# Patient Record
Sex: Female | Born: 1980 | Race: Black or African American | Hispanic: No | Marital: Single | Smoking: Former smoker
Health system: Southern US, Community
[De-identification: ages and names within clinical notes are randomized; demographics above are authoritative.]

## PROBLEM LIST (undated history)

## (undated) ENCOUNTER — Emergency Department (HOSPITAL_BASED_OUTPATIENT_CLINIC_OR_DEPARTMENT_OTHER): Admission: EM | Source: Home / Self Care

## (undated) DIAGNOSIS — A749 Chlamydial infection, unspecified: Secondary | ICD-10-CM

## (undated) DIAGNOSIS — I10 Essential (primary) hypertension: Secondary | ICD-10-CM

## (undated) DIAGNOSIS — N938 Other specified abnormal uterine and vaginal bleeding: Secondary | ICD-10-CM

## (undated) DIAGNOSIS — N12 Tubulo-interstitial nephritis, not specified as acute or chronic: Secondary | ICD-10-CM

## (undated) DIAGNOSIS — N7011 Chronic salpingitis: Secondary | ICD-10-CM

## (undated) DIAGNOSIS — A6000 Herpesviral infection of urogenital system, unspecified: Secondary | ICD-10-CM

## (undated) DIAGNOSIS — A599 Trichomoniasis, unspecified: Secondary | ICD-10-CM

## (undated) DIAGNOSIS — N739 Female pelvic inflammatory disease, unspecified: Secondary | ICD-10-CM

## (undated) DIAGNOSIS — N390 Urinary tract infection, site not specified: Secondary | ICD-10-CM

## (undated) DIAGNOSIS — J40 Bronchitis, not specified as acute or chronic: Secondary | ICD-10-CM

## (undated) HISTORY — DX: Other specified abnormal uterine and vaginal bleeding: N93.8

## (undated) HISTORY — PX: ANKLE SURGERY: SHX546

## (undated) HISTORY — PX: WISDOM TOOTH EXTRACTION: SHX21

---

## 2002-10-01 ENCOUNTER — Inpatient Hospital Stay (HOSPITAL_COMMUNITY): Admission: AD | Admit: 2002-10-01 | Discharge: 2002-10-01 | Payer: Self-pay | Admitting: *Deleted

## 2002-10-05 ENCOUNTER — Inpatient Hospital Stay (HOSPITAL_COMMUNITY): Admission: AD | Admit: 2002-10-05 | Discharge: 2002-10-05 | Payer: Self-pay | Admitting: *Deleted

## 2002-11-03 ENCOUNTER — Inpatient Hospital Stay (HOSPITAL_COMMUNITY): Admission: AD | Admit: 2002-11-03 | Discharge: 2002-11-03 | Payer: Self-pay | Admitting: Family Medicine

## 2003-02-28 ENCOUNTER — Inpatient Hospital Stay (HOSPITAL_COMMUNITY): Admission: AD | Admit: 2003-02-28 | Discharge: 2003-02-28 | Payer: Self-pay | Admitting: Obstetrics and Gynecology

## 2003-12-15 ENCOUNTER — Inpatient Hospital Stay (HOSPITAL_COMMUNITY): Admission: AD | Admit: 2003-12-15 | Discharge: 2003-12-16 | Payer: Self-pay | Admitting: *Deleted

## 2005-11-21 ENCOUNTER — Emergency Department (HOSPITAL_COMMUNITY): Admission: EM | Admit: 2005-11-21 | Discharge: 2005-11-21 | Payer: Self-pay | Admitting: Emergency Medicine

## 2005-11-27 ENCOUNTER — Ambulatory Visit (HOSPITAL_COMMUNITY): Admission: RE | Admit: 2005-11-27 | Discharge: 2005-11-27 | Payer: Self-pay | Admitting: Orthopedic Surgery

## 2007-03-24 ENCOUNTER — Emergency Department (HOSPITAL_COMMUNITY): Admission: EM | Admit: 2007-03-24 | Discharge: 2007-03-24 | Payer: Self-pay | Admitting: Emergency Medicine

## 2009-01-16 ENCOUNTER — Inpatient Hospital Stay (HOSPITAL_COMMUNITY): Admission: AD | Admit: 2009-01-16 | Discharge: 2009-01-16 | Payer: Self-pay | Admitting: Obstetrics & Gynecology

## 2009-02-27 ENCOUNTER — Emergency Department (HOSPITAL_COMMUNITY): Admission: EM | Admit: 2009-02-27 | Discharge: 2009-02-27 | Payer: Self-pay | Admitting: Emergency Medicine

## 2009-03-13 ENCOUNTER — Inpatient Hospital Stay (HOSPITAL_COMMUNITY): Admission: AD | Admit: 2009-03-13 | Discharge: 2009-03-14 | Payer: Self-pay | Admitting: Obstetrics & Gynecology

## 2009-06-03 ENCOUNTER — Emergency Department (HOSPITAL_COMMUNITY): Admission: EM | Admit: 2009-06-03 | Discharge: 2009-06-03 | Payer: Self-pay | Admitting: Emergency Medicine

## 2009-08-28 ENCOUNTER — Emergency Department (HOSPITAL_COMMUNITY): Admission: EM | Admit: 2009-08-28 | Discharge: 2009-08-28 | Payer: Self-pay | Admitting: Emergency Medicine

## 2010-02-22 ENCOUNTER — Emergency Department (HOSPITAL_COMMUNITY): Admission: EM | Admit: 2010-02-22 | Discharge: 2010-02-22 | Payer: Self-pay | Admitting: Emergency Medicine

## 2010-07-16 ENCOUNTER — Ambulatory Visit: Payer: Self-pay | Admitting: Nurse Practitioner

## 2010-07-16 ENCOUNTER — Inpatient Hospital Stay (HOSPITAL_COMMUNITY): Admission: AD | Admit: 2010-07-16 | Discharge: 2010-07-16 | Payer: Self-pay | Admitting: Obstetrics & Gynecology

## 2011-01-29 LAB — URINE MICROSCOPIC-ADD ON

## 2011-01-29 LAB — URINALYSIS, ROUTINE W REFLEX MICROSCOPIC
Bilirubin Urine: NEGATIVE
Nitrite: POSITIVE — AB
Protein, ur: NEGATIVE mg/dL
Urobilinogen, UA: 0.2 mg/dL (ref 0.0–1.0)

## 2011-01-29 LAB — URINE CULTURE: Colony Count: >=100000

## 2011-01-29 LAB — WET PREP, GENITAL: Trich, Wet Prep: NONE SEEN

## 2011-02-04 LAB — PREGNANCY, URINE: Preg Test, Ur: NEGATIVE

## 2011-02-04 LAB — URINALYSIS, ROUTINE W REFLEX MICROSCOPIC
Bilirubin Urine: NEGATIVE
Ketones, ur: NEGATIVE mg/dL
Ketones, ur: NEGATIVE mg/dL
Nitrite: NEGATIVE
Nitrite: NEGATIVE
Urobilinogen, UA: 0.2 mg/dL (ref 0.0–1.0)
pH: 6 (ref 5.0–8.0)

## 2011-02-04 LAB — WET PREP, GENITAL
Trich, Wet Prep: NONE SEEN
Yeast Wet Prep HPF POC: NONE SEEN

## 2011-02-04 LAB — URINE MICROSCOPIC-ADD ON

## 2011-02-04 LAB — GC/CHLAMYDIA PROBE AMP, GENITAL: Chlamydia, DNA Probe: NEGATIVE

## 2011-02-22 LAB — RAPID STREP SCREEN (MED CTR MEBANE ONLY): Streptococcus, Group A Screen (Direct): NEGATIVE

## 2011-02-25 LAB — COMPREHENSIVE METABOLIC PANEL
ALT: 35 U/L (ref 0–35)
AST: 29 U/L (ref 0–37)
Albumin: 3.6 g/dL (ref 3.5–5.2)
Alkaline Phosphatase: 109 U/L (ref 39–117)
Chloride: 108 mEq/L (ref 96–112)
GFR calc Af Amer: >60 mL/min (ref >60)
Potassium: 4.9 mEq/L (ref 3.5–5.1)
Sodium: 142 mEq/L (ref 135–145)
Total Protein: 7 g/dL (ref 6.0–8.3)

## 2011-02-25 LAB — URINALYSIS, ROUTINE W REFLEX MICROSCOPIC
Bilirubin Urine: NEGATIVE
Ketones, ur: NEGATIVE mg/dL
Nitrite: NEGATIVE
Nitrite: POSITIVE — AB
Protein, ur: NEGATIVE mg/dL
Protein, ur: >300 mg/dL — AB
Specific Gravity, Urine: 1.016 (ref 1.005–1.030)
Urobilinogen, UA: 1 mg/dL (ref 0.0–1.0)
pH: 6 (ref 5.0–8.0)

## 2011-02-25 LAB — DIFFERENTIAL
Basophils Relative: 1 % (ref 0–1)
Eosinophils Absolute: 0.2 10*3/uL (ref 0.0–0.7)
Eosinophils Relative: 2 % (ref 0–5)
Monocytes Absolute: 0.3 10*3/uL (ref 0.1–1.0)
Monocytes Relative: 3 % (ref 3–12)
Neutro Abs: 7 10*3/uL (ref 1.7–7.7)

## 2011-02-25 LAB — WET PREP, GENITAL
WBC, Wet Prep HPF POC: NONE SEEN
Yeast Wet Prep HPF POC: NONE SEEN

## 2011-02-25 LAB — CBC
Platelets: 367 10*3/uL (ref 150–400)
RDW: 13.7 % (ref 11.5–15.5)
WBC: 9.9 10*3/uL (ref 4.0–10.5)

## 2011-02-25 LAB — GC/CHLAMYDIA PROBE AMP, GENITAL: Chlamydia, DNA Probe: NEGATIVE

## 2011-02-25 LAB — URINE MICROSCOPIC-ADD ON

## 2011-02-25 LAB — PREGNANCY, URINE: Preg Test, Ur: NEGATIVE

## 2011-03-20 ENCOUNTER — Inpatient Hospital Stay (HOSPITAL_COMMUNITY)
Admission: AD | Admit: 2011-03-20 | Discharge: 2011-03-20 | Disposition: A | Discharge status: Other Acute Inpatient Hospital | Payer: Medicaid Other | Source: Ambulatory Visit | Attending: Obstetrics and Gynecology | Admitting: Obstetrics and Gynecology

## 2011-03-20 ENCOUNTER — Emergency Department (HOSPITAL_COMMUNITY): Payer: Medicaid Other

## 2011-03-20 ENCOUNTER — Emergency Department (HOSPITAL_COMMUNITY)
Admission: EM | Admit: 2011-03-20 | Discharge: 2011-03-21 | Disposition: A | Discharge status: Home / Self Care | Payer: Medicaid Other | Attending: Emergency Medicine | Admitting: Emergency Medicine

## 2011-03-20 ENCOUNTER — Emergency Department (HOSPITAL_COMMUNITY): Payer: Self-pay

## 2011-03-20 DIAGNOSIS — R1031 Right lower quadrant pain: Secondary | ICD-10-CM | POA: Insufficient documentation

## 2011-03-20 DIAGNOSIS — A5901 Trichomonal vulvovaginitis: Secondary | ICD-10-CM

## 2011-03-20 DIAGNOSIS — N739 Female pelvic inflammatory disease, unspecified: Secondary | ICD-10-CM | POA: Insufficient documentation

## 2011-03-20 DIAGNOSIS — A599 Trichomoniasis, unspecified: Secondary | ICD-10-CM | POA: Insufficient documentation

## 2011-03-20 LAB — DIFFERENTIAL
Basophils Absolute: 0 10*3/uL (ref 0.0–0.1)
Basophils Relative: 0 % (ref 0–1)
Eosinophils Relative: 2 % (ref 0–5)
Monocytes Absolute: 1.2 10*3/uL — ABNORMAL HIGH (ref 0.1–1.0)
Neutro Abs: 7.5 10*3/uL (ref 1.7–7.7)

## 2011-03-20 LAB — URINALYSIS, ROUTINE W REFLEX MICROSCOPIC
Glucose, UA: NEGATIVE mg/dL
Leukocytes, UA: NEGATIVE
Protein, ur: NEGATIVE mg/dL
Specific Gravity, Urine: >1.03 — ABNORMAL HIGH (ref 1.005–1.030)

## 2011-03-20 LAB — POCT I-STAT, CHEM 8
Creatinine, Ser: 0.8 mg/dL (ref 0.4–1.2)
Glucose, Bld: 83 mg/dL (ref 70–99)
Hemoglobin: 14.6 g/dL (ref 12.0–15.0)
TCO2: 25 mmol/L (ref 0–100)

## 2011-03-20 LAB — WET PREP, GENITAL: Yeast Wet Prep HPF POC: NONE SEEN

## 2011-03-20 LAB — URINE MICROSCOPIC-ADD ON

## 2011-03-20 LAB — CBC
Hemoglobin: 13.5 g/dL (ref 12.0–15.0)
MCHC: 32 g/dL (ref 30.0–36.0)
RDW: 13.5 % (ref 11.5–15.5)

## 2011-03-20 LAB — HEPATIC FUNCTION PANEL
Bilirubin, Direct: 0.1 mg/dL (ref 0.0–0.3)
Total Bilirubin: 0.4 mg/dL (ref 0.3–1.2)

## 2011-03-20 MED ORDER — IOHEXOL 300 MG/ML  SOLN
100.0000 mL | Freq: Once | INTRAMUSCULAR | Status: AC | PRN
  Administered 2011-03-20: 100 mL via INTRAVENOUS

## 2011-03-21 ENCOUNTER — Emergency Department (HOSPITAL_COMMUNITY): Payer: Medicaid Other

## 2011-03-21 LAB — GC/CHLAMYDIA PROBE AMP, GENITAL
Chlamydia, DNA Probe: NEGATIVE
GC Probe Amp, Genital: NEGATIVE

## 2011-03-23 ENCOUNTER — Other Ambulatory Visit (HOSPITAL_COMMUNITY): Payer: Self-pay

## 2011-04-03 NOTE — Op Note (Signed)
NAME:  Rebecca Flynn, Rebecca Flynn                  ACCOUNT NO.:  [id number]   MEDICAL RECORD NO.:  [id number]          PATIENT TYPE:  AMB   LOCATION:  SDS                          FACILITY:  [facility]   PHYSICIAN:  Madlyn Frankel. Charlann Boxer, M.D.  DATE OF BIRTH:  01/12/1981   DATE OF PROCEDURE:  11/27/2005  DATE OF DISCHARGE:  11/27/2005                                 OPERATIVE REPORT   PREOPERATIVE DIAGNOSIS:  Right ankle bimalleolar fracture, closed.   POSTOPERATIVE DIAGNOSIS:  Right ankle bimalleolar fracture, closed.   OPERATION PERFORMED:  Open reduction and internal fixation of right ankle  fracture utilizing the Synthes Small Frag System with antiglide technique  laterally, and two 4.5 millimeter cancellous screws medially.   SURGEON:  Madlyn Frankel. Charlann Boxer, M.D.   ASSISTANTDruscilla Brownie. Idolina Primer, P. A.-C   ANESTHESIA:  General.   ESTIMATED BLOOD LOSS:  Blood loss was minimal.   TOURNIQUET TIME:  The tourniquet time was 45 minutes at three-hundred  millimeters of mercury   COMPLICATIONS:  There were no complications.   INDICATIONS FOR SURGERY:  Rebecca Flynn is a 30 year old female who was initially  seen and evaluated in the emergency room in the early morning hours on  Saturday, November 21, 2004.  She was noted to have fallen in a mud hole and  had a twisting type injury.  Radiographically she had a supination external  rotation type 4 pattern fracture.  Based on some initial swelling she was  placed in a splint and had her leg elevated, and was to return to the  office.  Surgery was scheduled for a week after with strict elevation.   The risks and benefits of a nonhealing wound based on her smoking, increased  infection rate based on her size and smoking, and persistent discomfort  about the ankle following injury were all reviewed and consent was obtained.   DESCRIPTION OF THE OPERATION:  The patient was brought to the operative.  Once she had adequate anesthesia and preoperative antibiotics of 1  gram of  Ancef were administered the patient was positioned supine with a bumper  underneath the hip.  The right lower extremity was then prepped and draped  in sterile fashion  the right lower extremity was then exsanguinated and a  distal thigh tourniquet elevated.   A lateral based incision was made first over the fibula.  The fracture site  was identified.  Initial attempts at a lag screw technique were carried out;  however, I was not happy with the purchase on the posterior aspect of the  fibula in terms of its reduced.  Based on this antiglide technique was  chosen.   Following exposure a seven-hole plate was placed along the posterior aspect  of the fibula and using a lobster claw clamp the fracture was reduced  anatomically.  From this region thee bicortical screws were placed proximal  to the fracture site maintaining the fracture reduction.  Radiographs were  obtained.   At this time  attention was directed to the medial aspect.  A slight J  curved incision was utilized  to expose the medial malleolar fracture.  The  fracture was reduced anatomically using a bony tenaculum.  Once this was  done the 2.5 drill bit was passed through the malleolus and into the  tibialmost __________ .  Radiographs confirmed reduction.  At this point two  45 mm, 4.5 mm cancellous bone screws were then passed through the drill  holes.  Final radiographs were obtained.  The wounds were irrigated  copiously.  The medial and lateral wounds were closed in layers with nylon  sutures on the skin.  The wounds were then cleaned, dried and dressed  sterilely with Xeroform, sponges and a bulky posterior splint with a U and  L.   The patient as extubated and taken to the recovery room in stable condition.   DISCHARGE INSTRUCTIONS:  The patient will be discharged to home with pain  medication and antibiotic prophylaxis.  I will see her back was watching the  fracture heal.      Madlyn Frankel. Charlann Boxer, M.D.   Electronically Signed     MDO/MEDQ  D:  11/27/2005  T:  11/28/2005  Job:  [id number]

## 2011-04-03 NOTE — Discharge Summary (Signed)
NAME:  Rebecca Flynn, Rebecca Flynn                            ACCOUNT NO.:  [id number]   MEDICAL RECORD NO.:  [id number]                   PATIENT TYPE:  INP   LOCATION:  9306                                 FACILITY:  [facility]   PHYSICIAN:  Ivor Costa. Farrel Gobble, M.D.              DATE OF BIRTH:  [birth year]   DATE OF ADMISSION:  12/15/2003  DATE OF DISCHARGE:  12/16/2003                                 DISCHARGE SUMMARY   DISCHARGE DIAGNOSIS:  Pelvic pain with nausea, vomiting, and complaint of  discharge.   HISTORY OF PRESENT ILLNESS:  She is a 30 year old gravida 2 para 2 who  presented to the maternity admissions with a complaint of nausea, vomiting,  abdominal pain for 2 days, unable to tolerate fluids, complaint of a foul  discharge for 2 days, and low abdominal pain.  She does have a history of  gonorrhea and chlamydia in 2004 and a history of HSV.   LABORATORY DATA:  She was noted to have a urinary tract infection and she  also had cultures taken for gonorrhea and chlamydia that were both positive  and is currently on no contraception.   HOSPITAL COURSE AND TREATMENT:  She was treated for the [facility] and chlamydia  with IV Rocephin and IV doxycycline and was given Toradol for pain.  She was  also given Cipro IV as well.  She had an ultrasound that did show an  extensive pelvic inflammatory disease involving both adnexa and pelvic cul-  de-sac.  There also was a tuboovarian abscess formation on the right side.  She was discharged home with samples of medication of the Cipro.  She was  instructed to take 500 mg p.o. b.i.d. for 6 days.  She was also given a  prescription for doxycycline 100 mg to take b.i.d. for 6 days.  She was  encouraged to follow up with the health department and to follow up with an  ultrasound as well to check for resolution of the abscess, encouraged  contraception and condom use always, and she was discharged home in  satisfactory condition.     Davonna Belling. Young, N.P.                       Ivor Costa. Farrel Gobble, M.D.    [place] Lanius  D:  01/16/2004  T:  01/16/2004  Job:  [id number]

## 2011-05-05 ENCOUNTER — Inpatient Hospital Stay (HOSPITAL_COMMUNITY)
Admission: AD | Admit: 2011-05-05 | Discharge: 2011-05-07 | DRG: 759 | Disposition: A | Discharge status: Home / Self Care | Payer: Medicaid Other | Source: Ambulatory Visit | Attending: Obstetrics & Gynecology | Admitting: Obstetrics & Gynecology

## 2011-05-05 ENCOUNTER — Emergency Department (HOSPITAL_COMMUNITY): Payer: Medicaid Other

## 2011-05-05 ENCOUNTER — Emergency Department (HOSPITAL_COMMUNITY)
Admission: EM | Admit: 2011-05-05 | Discharge: 2011-05-05 | Disposition: A | Discharge status: Other Acute Inpatient Hospital | Payer: Medicaid Other | Attending: Emergency Medicine | Admitting: Emergency Medicine

## 2011-05-05 DIAGNOSIS — N739 Female pelvic inflammatory disease, unspecified: Secondary | ICD-10-CM | POA: Insufficient documentation

## 2011-05-05 DIAGNOSIS — N7013 Chronic salpingitis and oophoritis: Principal | ICD-10-CM | POA: Diagnosis present

## 2011-05-05 DIAGNOSIS — R112 Nausea with vomiting, unspecified: Secondary | ICD-10-CM | POA: Insufficient documentation

## 2011-05-05 DIAGNOSIS — N731 Chronic parametritis and pelvic cellulitis: Secondary | ICD-10-CM | POA: Diagnosis present

## 2011-05-05 DIAGNOSIS — N9489 Other specified conditions associated with female genital organs and menstrual cycle: Secondary | ICD-10-CM | POA: Insufficient documentation

## 2011-05-05 DIAGNOSIS — R1031 Right lower quadrant pain: Secondary | ICD-10-CM | POA: Diagnosis present

## 2011-05-05 LAB — COMPREHENSIVE METABOLIC PANEL
Albumin: 3.9 g/dL (ref 3.5–5.2)
BUN: 12 mg/dL (ref 6–23)
Chloride: 103 mEq/L (ref 96–112)
Creatinine, Ser: 0.79 mg/dL (ref 0.50–1.10)
GFR calc non Af Amer: >60 mL/min (ref >60)
Total Bilirubin: 0.2 mg/dL — ABNORMAL LOW (ref 0.3–1.2)

## 2011-05-05 LAB — WET PREP, GENITAL
Trich, Wet Prep: NONE SEEN
WBC, Wet Prep HPF POC: NONE SEEN
Yeast Wet Prep HPF POC: NONE SEEN

## 2011-05-05 LAB — DIFFERENTIAL
Eosinophils Relative: 3 % (ref 0–5)
Lymphocytes Relative: 34 % (ref 12–46)
Lymphs Abs: 3.8 10*3/uL (ref 0.7–4.0)
Monocytes Absolute: 1 10*3/uL (ref 0.1–1.0)
Monocytes Relative: 9 % (ref 3–12)

## 2011-05-05 LAB — LIPASE, BLOOD: Lipase: 27 U/L (ref 11–59)

## 2011-05-05 LAB — URINALYSIS, ROUTINE W REFLEX MICROSCOPIC
Ketones, ur: NEGATIVE mg/dL
Leukocytes, UA: NEGATIVE
Nitrite: NEGATIVE
Protein, ur: NEGATIVE mg/dL
Urobilinogen, UA: 0.2 mg/dL (ref 0.0–1.0)

## 2011-05-05 LAB — CBC
HCT: 45 % (ref 36.0–46.0)
MCH: 29.7 pg (ref 26.0–34.0)
MCV: 87.2 fL (ref 78.0–100.0)
RDW: 13.2 % (ref 11.5–15.5)
WBC: 11.3 10*3/uL — ABNORMAL HIGH (ref 4.0–10.5)

## 2011-05-05 LAB — URINE MICROSCOPIC-ADD ON

## 2011-05-06 LAB — GC/CHLAMYDIA PROBE AMP, GENITAL: GC Probe Amp, Genital: NEGATIVE

## 2011-05-07 NOTE — Discharge Summary (Signed)
  Rebecca Flynn, Rebecca Flynn NO.:  [id number]  MEDICAL RECORD NO.:  [id number]  LOCATION:  9309                          FACILITY:  [facility]  PHYSICIAN:  Scheryl Darter, MD       DATE OF BIRTH:  1981/07/03  DATE OF ADMISSION:  05/05/2011 DATE OF DISCHARGE:  05/07/2011                              DISCHARGE SUMMARY   DIAGNOSIS:  Pelvic inflammatory disease with hydrosalpinx.  The patient is a 30 year old black female gravida 2, para 2 who has a history of 2 weeks of right lower quadrant pain.  She had been evaluated for possible appendicitis.  She had been treated with antibiotics for 2 weeks, but her pain returned on May 04, 2011, and she came back to [facility].  Ultrasound showed left hydrosalpinx.  PAST MEDICAL HISTORY: 1. Anxiety. 2. Obesity. 3. Trichomonas treated about 6 weeks ago. 4. History of Chlamydia and gonorrhea in the past.  SOCIAL HISTORY:  The patient is single.  She smokes a pack of cigarettes a day.  She admits to marijuana use and social alcohol use.  MEDICATION:  Xanax p.r.n. was not prescribed for her.  FAMILY HISTORY:  Breast cancer and colon cancer.  PAST SURGICAL HISTORY:  Two cesarean sections and right foot surgery.  ALLERGIES:  No known drug allergies.  REVIEW OF SYSTEMS:  No abdominal pain, no dysuria or discharge or bleeding, no nausea or vomiting.  PHYSICAL EXAMINATION:  VITAL SIGNS:  Blood pressure 138/88, pulse 60, respirations 22, temperature 97.2. CHEST:  Clear. HEART:  Regular rhythm. ABDOMEN:  She has some suprapubic tenderness, right lower quadrant tenderness, nondistended, no guarding or rebound. PELVIC:  Thick white vaginal discharge.  Cervix was pink and tender. Uterus anteverted and firm and right adnexal tenderness.  No masses.  LABORATORY DATA:  White count was 11.3, hemoglobin was 15.3.  Potassium was 4.3.  AST 42, ALT 54.  Urinalysis 0 to 2 white blood cells, negative nitrites.  Lipase was normal.   Ultrasound showed normal-sized uterus, right ovary appeared normal, left ovary appeared normal with a left complex fluid collection 11 cm x 3.3 cm x 3.7 cm consistent with hydrosalpinx.  The patient was admitted after being transferred to [facility] for treatment of pelvic inflammatory disease with possible hydrosalpinx. She was given Rocephin 1 g IV q.12 h.  She remained afebrile during hospitalization.  Her pain improved.  On May 07, 2011, she said she was feeling much better.  She wanted to be discharged.  She was discharged with prescription for doxycycline 100 mg p.o. b.i.d., 24 tablets.  She was also given prescription for Motrin 800 mg p.o. q.8 h. p.r.n. pain.  She was told to have pelvic rest.  She is to follow up in Gynecology Clinic in about 2 weeks.  She is to report if her pain worsens or if she had fevers or other symptoms of worsening condition.     Scheryl Darter, MD     JA/MEDQ  D:  05/07/2011  T:  05/07/2011  Job:  [id number]  Electronically Signed by Scheryl Darter MD on 05/07/2011 01:54:35 PM

## 2011-07-25 ENCOUNTER — Inpatient Hospital Stay (HOSPITAL_COMMUNITY)
Admission: AD | Admit: 2011-07-25 | Discharge: 2011-07-25 | Disposition: A | Discharge status: Home / Self Care | Payer: Medicaid Other | Source: Ambulatory Visit | Attending: Obstetrics & Gynecology | Admitting: Obstetrics & Gynecology

## 2011-07-25 ENCOUNTER — Encounter (HOSPITAL_COMMUNITY): Payer: Self-pay

## 2011-07-25 DIAGNOSIS — B3731 Acute candidiasis of vulva and vagina: Secondary | ICD-10-CM | POA: Insufficient documentation

## 2011-07-25 DIAGNOSIS — F411 Generalized anxiety disorder: Secondary | ICD-10-CM | POA: Insufficient documentation

## 2011-07-25 DIAGNOSIS — F419 Anxiety disorder, unspecified: Secondary | ICD-10-CM

## 2011-07-25 DIAGNOSIS — B373 Candidiasis of vulva and vagina: Secondary | ICD-10-CM

## 2011-07-25 DIAGNOSIS — R109 Unspecified abdominal pain: Secondary | ICD-10-CM | POA: Insufficient documentation

## 2011-07-25 HISTORY — DX: Herpesviral infection of urogenital system, unspecified: A60.00

## 2011-07-25 HISTORY — DX: Bronchitis, not specified as acute or chronic: J40

## 2011-07-25 LAB — CBC
Platelets: 378 10*3/uL (ref 150–400)
RBC: 4.9 MIL/uL (ref 3.87–5.11)
WBC: 13 10*3/uL — ABNORMAL HIGH (ref 4.0–10.5)

## 2011-07-25 LAB — DIFFERENTIAL
Eosinophils Absolute: 0.2 10*3/uL (ref 0.0–0.7)
Lymphocytes Relative: 26 % (ref 12–46)
Lymphs Abs: 3.4 10*3/uL (ref 0.7–4.0)
Neutrophils Relative %: 63 % (ref 43–77)

## 2011-07-25 LAB — URINE MICROSCOPIC-ADD ON

## 2011-07-25 LAB — URINALYSIS, ROUTINE W REFLEX MICROSCOPIC
Bilirubin Urine: NEGATIVE
Protein, ur: NEGATIVE mg/dL
Urobilinogen, UA: 0.2 mg/dL (ref 0.0–1.0)

## 2011-07-25 LAB — WET PREP, GENITAL
Clue Cells Wet Prep HPF POC: NONE SEEN
Trich, Wet Prep: NONE SEEN

## 2011-07-25 MED ORDER — ALBUTEROL SULFATE HFA 108 (90 BASE) MCG/ACT IN AERS
2.0000 | INHALATION_SPRAY | RESPIRATORY_TRACT | Status: DC
  Filled 2011-07-25: qty 6.7

## 2011-07-25 MED ORDER — KETOROLAC TROMETHAMINE 60 MG/2ML IM SOLN: 60.0000 mg | Freq: Once | INTRAMUSCULAR | Status: DC

## 2011-07-25 NOTE — ED Provider Notes (Signed)
History     CSN: [id number] Arrival date & time: 07/25/2011  1:56 PM  Chief Complaint  Patient presents with  . Abdominal Cramping   HPI Rebecca Flynn is a 30 y.o. female who presents to MAU for low abdominal cramping. She states she was hospitalized less than 2 months ago for PID and has not had follow up due to lack of insurance. She began low abdominal cramping yesterday. LMP 05/31/11. History of irregular periods.  Hx of HSV. Concerned that may be getting PID again. No sex since hospitalized.  Past Medical History  Diagnosis Date  . Bronchitis   . Herpes genitalis     No past surgical history on file.  No family history on file.  History  Substance Use Topics  . Smoking status: Not on file  . Smokeless tobacco: Not on file  . Alcohol Use: Not on file    OB History    Grav Para Term Preterm Abortions TAB SAB Ect Mult Living                  Review of Systems  Constitutional: Positive for appetite change and fatigue. Negative for fever, chills and diaphoresis.  HENT: Negative for ear pain, congestion, sore throat, facial swelling, neck pain, neck stiffness, dental problem and sinus pressure.   Eyes: Negative for photophobia, pain, discharge and visual disturbance.  Respiratory: Positive for cough and wheezing. Negative for chest tightness.   Cardiovascular: Negative.   Gastrointestinal: Positive for abdominal pain. Negative for nausea, vomiting, diarrhea, constipation and abdominal distention.  Genitourinary: Positive for pelvic pain. Negative for dysuria, flank pain, vaginal bleeding, vaginal discharge and difficulty urinating.  Musculoskeletal: Negative for myalgias, back pain and gait problem.  Skin: Negative for color change and rash.  Neurological: Negative for dizziness, speech difficulty, weakness, light-headedness, numbness and headaches.  Psychiatric/Behavioral: Positive for sleep disturbance. Negative for confusion and agitation.    Physical Exam  BP 146/99   Pulse 77  Temp(Src) 98.8 F (37.1 C) (Oral)  Resp 18  Ht 5\' 4"  (1.626 m)  Wt 237 lb (107.502 kg)  BMI 40.68 kg/m2  LMP 05/31/2011  Physical Exam  Nursing note and vitals reviewed. Constitutional: She is oriented to person, place, and time. She appears well-developed and well-nourished.  HENT:  Head: Normocephalic.  Eyes: EOM are normal.  Neck: Neck supple.  Pulmonary/Chest: Effort normal.  Abdominal: Soft. There is no tenderness.       Unable to reproduce the cramping pain the patient has had.  Genitourinary:       Thick white cheesy discharge. No CMT, no adnexal tenderness. No palpable enlargement of the uterus.   Musculoskeletal: Normal range of motion.  Neurological: She is alert and oriented to person, place, and time. No cranial nerve deficit.  Skin: Skin is warm and dry.   Results for orders placed during the hospital encounter of 07/25/11 (from the past 24 hour(s))  URINALYSIS, ROUTINE W REFLEX MICROSCOPIC     Status: Abnormal   Collection Time   07/25/11  2:15 PM      Component Value Range   Color, Urine YELLOW  YELLOW    Appearance CLEAR  CLEAR    Specific Gravity, Urine 1.025  1.005 - 1.030    pH 5.5  5.0 - 8.0    Glucose, UA NEGATIVE  NEGATIVE (mg/dL)   Hgb urine dipstick SMALL (*) NEGATIVE    Bilirubin Urine NEGATIVE  NEGATIVE    Ketones, ur NEGATIVE  NEGATIVE (mg/dL)  Protein, ur NEGATIVE  NEGATIVE (mg/dL)   Urobilinogen, UA 0.2  0.0 - 1.0 (mg/dL)   Nitrite NEGATIVE  NEGATIVE    Leukocytes, UA NEGATIVE  NEGATIVE   URINE MICROSCOPIC-ADD ON     Status: Abnormal   Collection Time   07/25/11  2:15 PM      Component Value Range   Squamous Epithelial / LPF FEW (*) RARE    WBC, UA 0-2  <3 (WBC/hpf)   RBC / HPF 0-2  <3 (RBC/hpf)   Bacteria, UA FEW (*) RARE   POCT PREGNANCY, URINE     Status: Normal   Collection Time   07/25/11  2:16 PM      Component Value Range   Preg Test, Ur NEGATIVE    WET PREP, GENITAL     Status: Abnormal   Collection Time   07/25/11  3:05  PM      Component Value Range   Yeast, Wet Prep NONE SEEN  NONE SEEN    Trich, Wet Prep NONE SEEN  NONE SEEN    Clue Cells, Wet Prep NONE SEEN  NONE SEEN    WBC, Wet Prep HPF POC FEW (*) NONE SEEN   CBC     Status: Abnormal   Collection Time   07/25/11  3:16 PM      Component Value Range   WBC 13.0 (*) 4.0 - 10.5 (K/uL)   RBC 4.90  3.87 - 5.11 (MIL/uL)   Hemoglobin 14.8  12.0 - 15.0 (g/dL)   HCT 40.3  47.4 - 25.9 (%)   MCV 90.6  78.0 - 100.0 (fL)   MCH 30.2  26.0 - 34.0 (pg)   MCHC 33.3  30.0 - 36.0 (g/dL)   RDW 56.3  87.5 - 64.3 (%)   Platelets 378  150 - 400 (K/uL)  DIFFERENTIAL     Status: Abnormal   Collection Time   07/25/11  3:16 PM      Component Value Range   Neutrophils Relative 63  43 - 77 (%)   Neutro Abs 8.2 (*) 1.7 - 7.7 (K/uL)   Lymphocytes Relative 26  12 - 46 (%)   Lymphs Abs 3.4  0.7 - 4.0 (K/uL)   Monocytes Relative 9  3 - 12 (%)   Monocytes Absolute 1.1 (*) 0.1 - 1.0 (K/uL)   Eosinophils Relative 1  0 - 5 (%)   Eosinophils Absolute 0.2  0.0 - 0.7 (K/uL)   Basophils Relative 0  0 - 1 (%)   Basophils Absolute 0.0  0.0 - 0.1 (K/uL)   ED Course  Procedures  Assessment: Anxiety   Yeast infection                       Abdominal cramping   Plan:  Reassured patient that clinical findings are negative other than yeast infection  Encouraged patient to follow up in GYN CLinic  Return here as needed.        [place], [place] 07/25/11 1556

## 2011-07-25 NOTE — Progress Notes (Signed)
Patient reports had fluid in fallopian tubes was seen previously at [facility] abdominal pain, LMP 7/15 has not done home pregnancy test.

## 2011-08-29 ENCOUNTER — Emergency Department (HOSPITAL_COMMUNITY)
Admission: EM | Admit: 2011-08-29 | Discharge: 2011-08-29 | Disposition: A | Discharge status: Home / Self Care | Payer: Medicaid Other | Attending: Emergency Medicine | Admitting: Emergency Medicine

## 2011-08-29 DIAGNOSIS — K089 Disorder of teeth and supporting structures, unspecified: Secondary | ICD-10-CM | POA: Insufficient documentation

## 2011-08-29 DIAGNOSIS — K006 Disturbances in tooth eruption: Secondary | ICD-10-CM | POA: Insufficient documentation

## 2011-08-29 DIAGNOSIS — Z79899 Other long term (current) drug therapy: Secondary | ICD-10-CM | POA: Insufficient documentation

## 2011-08-29 DIAGNOSIS — J45909 Unspecified asthma, uncomplicated: Secondary | ICD-10-CM | POA: Insufficient documentation

## 2011-09-04 IMAGING — US US PELVIS COMPLETE
1 series · 13 of 25 positions shown · non-contrast
Comparison: 03/21/2011

CLINICAL DATA: Pelvic pain left greater than right

TRANSABDOMINAL AND TRANSVAGINAL ULTRASOUND OF PELVIS
TECHNIQUE: Both transabdominal and transvaginal ultrasound
examinations of the pelvis were performed. Transabdominal technique
was performed for global imaging of the pelvis including uterus,
ovaries, adnexal regions, and pelvic cul-de-sac.

[Series 1: us pelvis complete · 0.28mm/px · 13 of 80 slices shown]
[im 1/80]
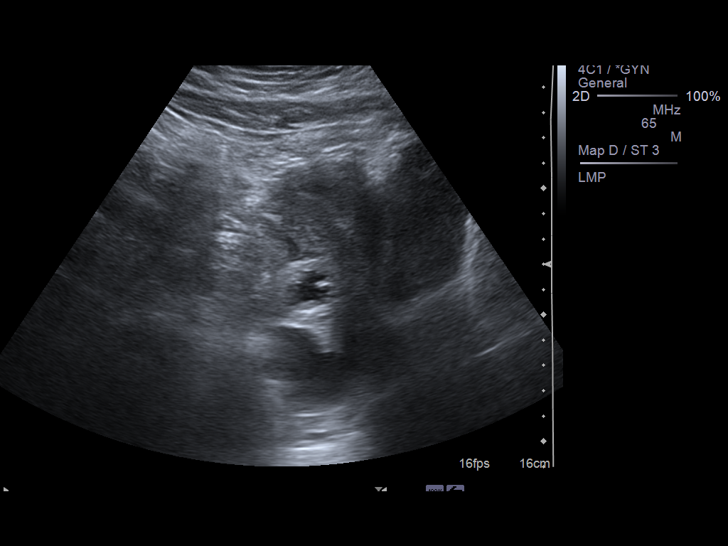
[im 7/80]
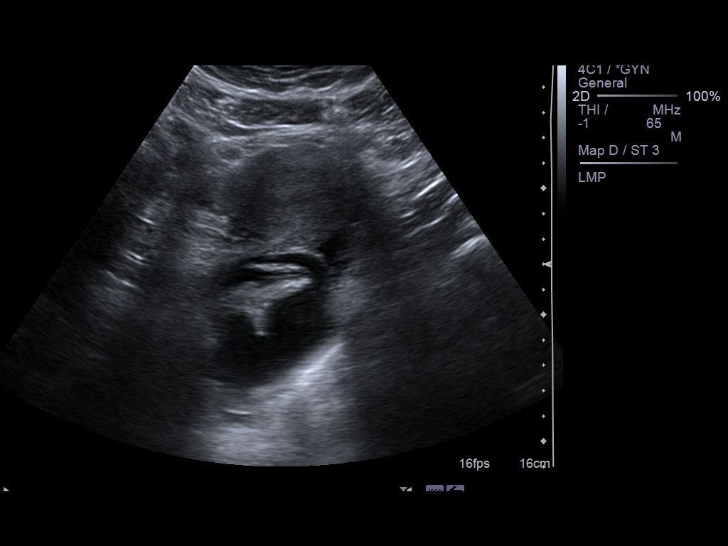
[im 14/80]
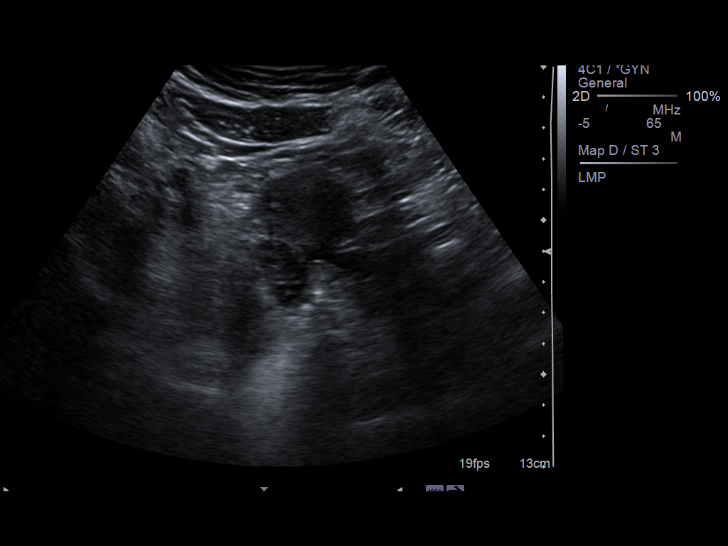
[im 20/80]
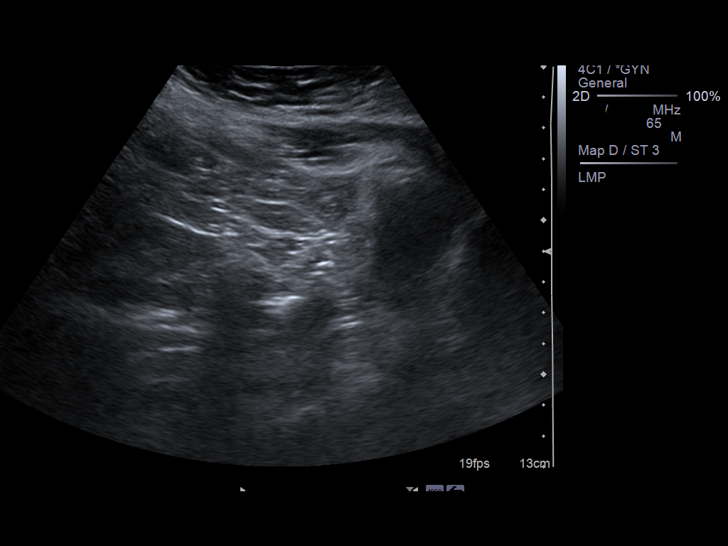
[im 27/80]
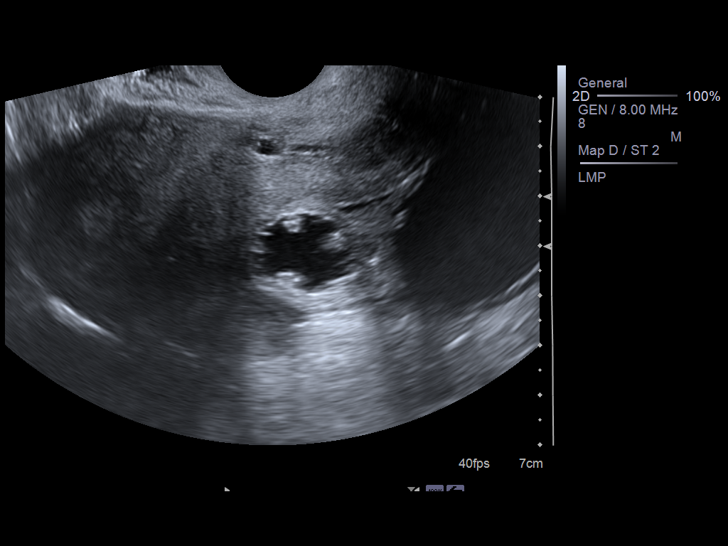
[im 33/80]
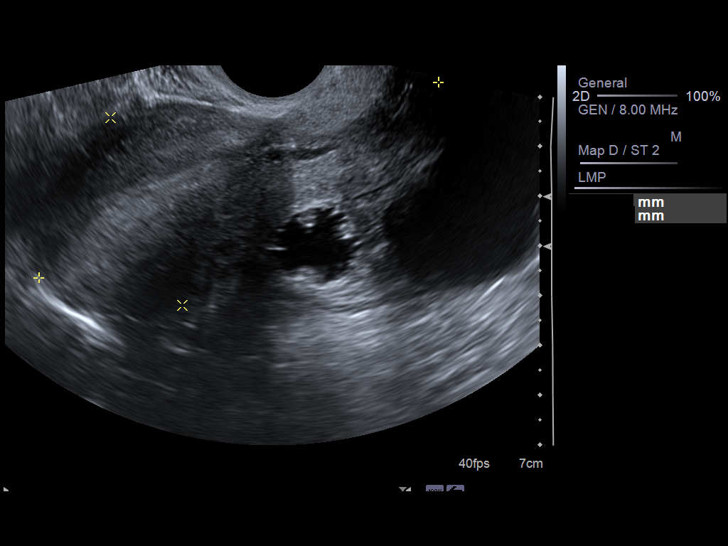
[im 40/80]
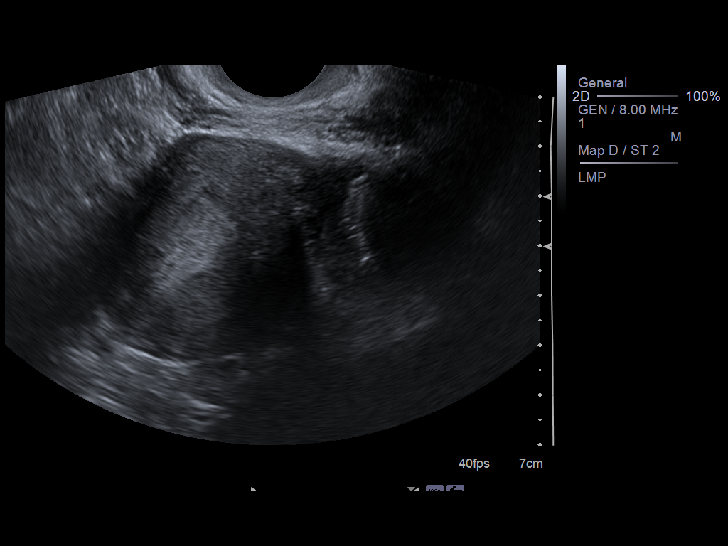
[im 47/80]
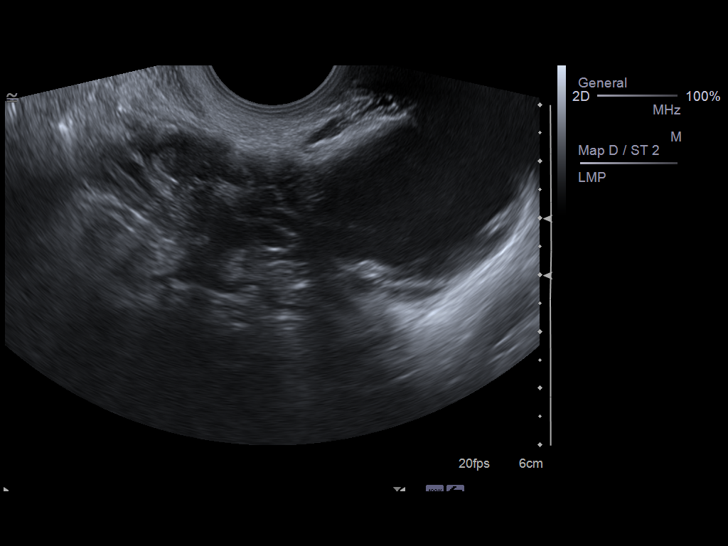
[im 53/80]
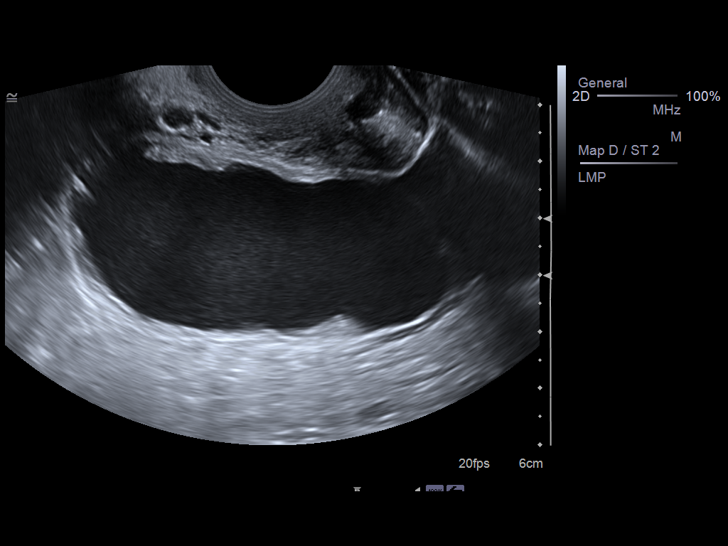
[im 60/80]
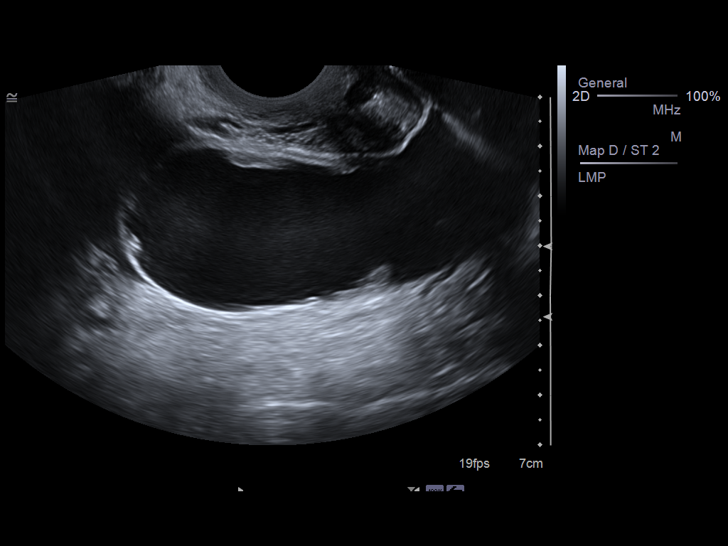
[im 66/80]
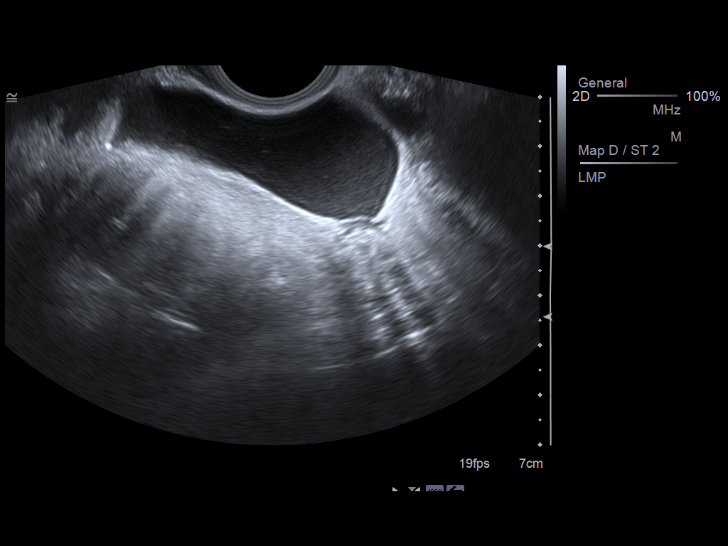
[im 73/80]
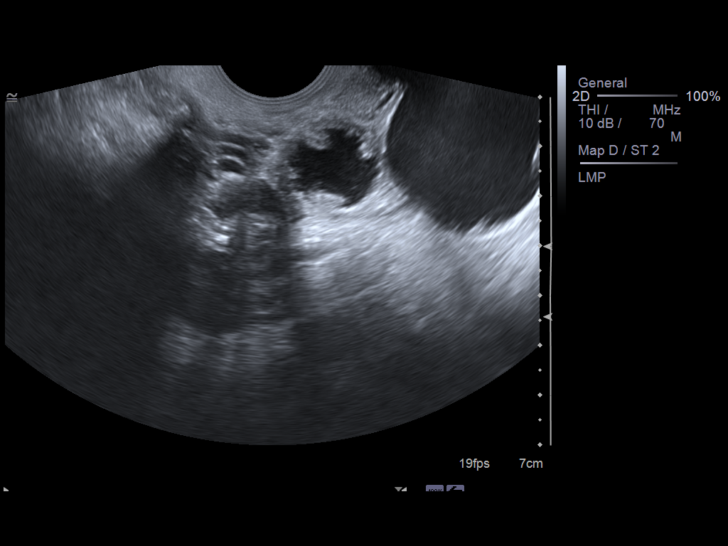
[im 80/80]
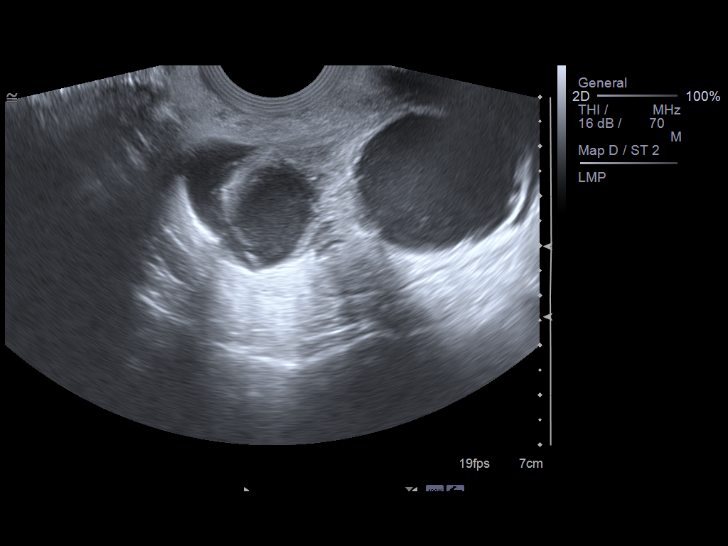

[13 of 25 positions shown; findings below may reference images not displayed]

It was necessary to proceed with endovaginal exam following the
transabdomnial exam to visualize the endometrium and adnexa.
FINDINGS: Uterus: Demonstrates a sagittal length of 9.0 cm, AP depth of
cm and a transverse width of 5.7 cm.  A homogeneous myometrium is
seen

Endometrium: Is homogeneously echogenic with an AP width of 11 mm.
No areas of focal thickening or heterogeneity are seen

Right ovary:  Has a normal appearance measuring 2.4 by 2.5 x 1.6 cm

Left ovary: Has a normal appearance measuring 3.4 x 2.0 x 2.1 cm.

Other findings: In the left adnexal region there is a serpiginous
complex fluid collection identified measuring approximately 11 cm
in length, 3.3 cm in depth and 3.7 cm in width.  The appearance
transabdominally is compatible with a hydrosalpinx.  On endovaginal
exam there are diffuse low level echoes identified compatible with
some internal proteinaceous containing fluid.  Focal avascular wall
nodularity is seen and has been described with chronic salpingitis.
IMPRESSION: Normal uterine myometrium, endometrium and ovaries.

Tubular fluid filled adnexal mass compatible with a hydrosalpinx.
Diffuse low level echoes seen on today's exam were not clearly
demonstrated on the prior exam approximately 6 weeks ago.  This
raises the possibility of interval development of hemato or
pyosalpinx. Avascular fallopian tube nodularity may indicate
underlying chronic salpingitis.

## 2011-09-30 ENCOUNTER — Inpatient Hospital Stay (HOSPITAL_COMMUNITY)
Admission: AD | Admit: 2011-09-30 | Discharge: 2011-09-30 | Disposition: A | Discharge status: Home / Self Care | Payer: Medicaid Other | Source: Ambulatory Visit | Attending: Obstetrics and Gynecology | Admitting: Obstetrics and Gynecology

## 2011-09-30 ENCOUNTER — Encounter (HOSPITAL_COMMUNITY): Payer: Self-pay

## 2011-09-30 DIAGNOSIS — R319 Hematuria, unspecified: Secondary | ICD-10-CM | POA: Insufficient documentation

## 2011-09-30 DIAGNOSIS — R109 Unspecified abdominal pain: Secondary | ICD-10-CM | POA: Insufficient documentation

## 2011-09-30 HISTORY — DX: Chronic salpingitis: N70.11

## 2011-09-30 LAB — WET PREP, GENITAL
Clue Cells Wet Prep HPF POC: NONE SEEN
Trich, Wet Prep: NONE SEEN
Yeast Wet Prep HPF POC: NONE SEEN

## 2011-09-30 LAB — CBC
Platelets: 411 10*3/uL — ABNORMAL HIGH (ref 150–400)
RBC: 4.72 MIL/uL (ref 3.87–5.11)
RDW: 13.2 % (ref 11.5–15.5)
WBC: 11.8 10*3/uL — ABNORMAL HIGH (ref 4.0–10.5)

## 2011-09-30 LAB — URINE MICROSCOPIC-ADD ON

## 2011-09-30 LAB — URINALYSIS, ROUTINE W REFLEX MICROSCOPIC
Glucose, UA: NEGATIVE mg/dL
Ketones, ur: NEGATIVE mg/dL
Leukocytes, UA: NEGATIVE
Protein, ur: NEGATIVE mg/dL

## 2011-09-30 LAB — POCT PREGNANCY, URINE: Preg Test, Ur: NEGATIVE

## 2011-09-30 MED ORDER — CIPROFLOXACIN HCL 500 MG PO TABS: 500.0000 mg | ORAL_TABLET | Freq: Two times a day (BID) | ORAL | Status: AC

## 2011-09-30 MED ORDER — KETOROLAC TROMETHAMINE 60 MG/2ML IM SOLN: 60.0000 mg | Freq: Once | INTRAMUSCULAR | Status: DC

## 2011-09-30 MED ORDER — KETOROLAC TROMETHAMINE 10 MG PO TABS: 10.0000 mg | ORAL_TABLET | Freq: Four times a day (QID) | ORAL | Status: AC | PRN

## 2011-09-30 MED ORDER — KETOROLAC TROMETHAMINE 60 MG/2ML IM SOLN
INTRAMUSCULAR | Status: AC
  Administered 2011-09-30: 60 mg via INTRAMUSCULAR
  Filled 2011-09-30: qty 2

## 2011-09-30 NOTE — ED Provider Notes (Signed)
History     Chief Complaint  Patient presents with  . Abdominal Pain   HPI Rebecca Flynn 30 y.o. comes to MAU today with sudden onset of abdominal pain at 3:30 pm today.    OB History    Grav Para Term Preterm Abortions TAB SAB Ect Mult Living   2 2 2       2       Past Medical History  Diagnosis Date  . Bronchitis   . Herpes genitalis   . Asthma     Past Surgical History  Procedure Date  . Cesarean section   . Wisdom tooth extraction   . Ankle surgery     History reviewed. No pertinent family history.  History  Substance Use Topics  . Smoking status: Current Everyday Smoker -- 0.5 packs/day  . Smokeless tobacco: Never Used  . Alcohol Use: No    Allergies:  Allergies  Allergen Reactions  . Penicillins Nausea And Vomiting    Prescriptions prior to admission  Medication Sig Dispense Refill  . albuterol (PROVENTIL HFA;VENTOLIN HFA) 108 (90 BASE) MCG/ACT inhaler Inhale 2 puffs into the lungs daily as needed.        . calcium carbonate (TITRALAC) 420 MG CHEW Chew 420 mg by mouth. Upset stomach       . acetaminophen (TYLENOL) 500 MG chewable tablet Chew 1,000 mg by mouth every 6 (six) hours as needed. For headache         Review of Systems  Gastrointestinal: Positive for abdominal pain. Negative for nausea and vomiting.  Genitourinary:       Vaginal discharge No vaginal bleeding   Physical Exam   Blood pressure 146/92, pulse 64, temperature 98.4 F (36.9 C), temperature source Oral, resp. rate 24, height 5\' 4"  (1.626 m), weight 232 lb 9.6 oz (105.507 kg), last menstrual period 08/31/2011, SpO2 97.00%.  Physical Exam  Nursing note and vitals reviewed. Constitutional: She is oriented to person, place, and time. She appears well-developed and well-nourished.  HENT:  Head: Normocephalic.  Eyes: EOM are normal.  Neck: Neck supple.  GI: Soft. Bowel sounds are normal. There is tenderness. There is no rebound and no guarding.  Genitourinary:       Speculum  exam: Vulva: negative Vagina - Small amount of creamy discharge, no odor Cervix - No contact bleeding Bimanual exam: Cervix closed Uterus mildly tender, exam limited by habitus Adnexa tender R>L, exam limited by habitus GC/Chlam, wet prep done Chaperone present for exam.  Musculoskeletal: Normal range of motion.  Neurological: She is alert and oriented to person, place, and time.  Skin: Skin is warm and dry.  Psychiatric: She has a normal mood and affect.    MAU Course  Procedures Results for orders placed during the hospital encounter of 09/30/11 (from the past 24 hour(s))  URINALYSIS, ROUTINE W REFLEX MICROSCOPIC     Status: Abnormal   Collection Time   09/30/11  5:45 PM      Component Value Range   Color, Urine YELLOW  YELLOW    Appearance HAZY (*) CLEAR    Specific Gravity, Urine >1.030 (*) 1.005 - 1.030    pH 5.5  5.0 - 8.0    Glucose, UA NEGATIVE  NEGATIVE (mg/dL)   Hgb urine dipstick MODERATE (*) NEGATIVE    Bilirubin Urine SMALL (*) NEGATIVE    Ketones, ur NEGATIVE  NEGATIVE (mg/dL)   Protein, ur NEGATIVE  NEGATIVE (mg/dL)   Urobilinogen, UA 0.2  0.0 - 1.0 (mg/dL)  Nitrite NEGATIVE  NEGATIVE    Leukocytes, UA NEGATIVE  NEGATIVE   URINE MICROSCOPIC-ADD ON     Status: Abnormal   Collection Time   09/30/11  5:45 PM      Component Value Range   Squamous Epithelial / LPF MANY (*) RARE    WBC, UA 0-2  <3 (WBC/hpf)   Bacteria, UA FEW (*) RARE    Casts HYALINE CASTS (*) NEGATIVE    Crystals CA OXALATE CRYSTALS (*) NEGATIVE    Urine-Other MUCOUS PRESENT    POCT PREGNANCY, URINE     Status: Normal   Collection Time   09/30/11  5:59 PM      Component Value Range   Preg Test, Ur NEGATIVE    WET PREP, GENITAL     Status: Abnormal   Collection Time   09/30/11  6:42 PM      Component Value Range   Yeast, Wet Prep NONE SEEN  NONE SEEN    Trich, Wet Prep NONE SEEN  NONE SEEN    Clue Cells, Wet Prep NONE SEEN  NONE SEEN    WBC, Wet Prep HPF POC FEW (*) NONE SEEN   CBC      Status: Abnormal   Collection Time   09/30/11  6:44 PM      Component Value Range   WBC 11.8 (*) 4.0 - 10.5 (K/uL)   RBC 4.72  3.87 - 5.11 (MIL/uL)   Hemoglobin 14.0  12.0 - 15.0 (g/dL)   HCT 16.1  09.6 - 04.5 (%)   MCV 88.3  78.0 - 100.0 (fL)   MCH 29.7  26.0 - 34.0 (pg)   MCHC 33.6  30.0 - 36.0 (g/dL)   RDW 40.9  81.1 - 91.4 (%)   Platelets 411 (*) 150 - 400 (K/uL)    MDM Client feeling better after toradol Previous discharge note reviewed - discharge in June 2012 - dx hydrosalpinx.  Did not follow up in GYN clinic.  Have sent message to clinic to contact client for appointment.  She currently has Medicaid.  Assessment and Plan  Hematuria and casts in urine  Plan: Advised increased fluid intake Will prescribe Cipro x 3 days Will culture urine. Will prescribe Toradol for pain management. Will need follow up in GYN clinic  Return if abdominal pain continues - may need ultrasound to reevaluate hydrosalpinx.   Rebecca Flynn 09/30/2011, 6:45 PM   Nolene Bernheim, NP 09/30/11 [id number]

## 2011-09-30 NOTE — Progress Notes (Signed)
Pt states at 1500 noted lower abd pain that was sudden. Feels like she has to have bowel mvmt. Had small bm this am, is regular per pt. Denies bleeding, does have white vaginal d/c noted when giving u/a in MAU. Pain intermittent, however is becoming more severe. Does have rebound tenderness on rlq only. Denies uti s/s.

## 2011-09-30 NOTE — Progress Notes (Signed)
Patient states she had sudden onset of lower abdominal pain at 1530. Constant. No bleeding or vaginal discharge. Vomited x 1 today.

## 2011-10-02 LAB — URINE CULTURE
Colony Count: NO GROWTH
Culture  Setup Time: 201211150542

## 2011-10-05 NOTE — ED Provider Notes (Signed)
Attestation of Attending Supervision of Advanced Practitioner: Evaluation and management procedures were performed by the PA/NP/CNM/OB Fellow under my supervision/collaboration. Chart reviewed and agree with management and plan.  Makaylah Oddo V 10/05/2011 8:28 AM

## 2011-11-05 ENCOUNTER — Encounter: Payer: Medicaid Other | Admitting: Family

## 2011-11-05 ENCOUNTER — Ambulatory Visit: Payer: Medicaid Other | Admitting: Family

## 2011-11-19 ENCOUNTER — Inpatient Hospital Stay (HOSPITAL_COMMUNITY)
Admission: AD | Admit: 2011-11-19 | Discharge: 2011-11-19 | Disposition: A | Discharge status: Home / Self Care | Payer: Medicaid Other | Source: Ambulatory Visit | Attending: Obstetrics and Gynecology | Admitting: Obstetrics and Gynecology

## 2011-11-19 ENCOUNTER — Encounter (HOSPITAL_COMMUNITY): Payer: Self-pay | Admitting: *Deleted

## 2011-11-19 DIAGNOSIS — B3731 Acute candidiasis of vulva and vagina: Secondary | ICD-10-CM | POA: Insufficient documentation

## 2011-11-19 DIAGNOSIS — N949 Unspecified condition associated with female genital organs and menstrual cycle: Secondary | ICD-10-CM | POA: Insufficient documentation

## 2011-11-19 DIAGNOSIS — B373 Candidiasis of vulva and vagina: Secondary | ICD-10-CM | POA: Insufficient documentation

## 2011-11-19 HISTORY — DX: Female pelvic inflammatory disease, unspecified: N73.9

## 2011-11-19 HISTORY — DX: Chlamydial infection, unspecified: A74.9

## 2011-11-19 HISTORY — DX: Tubulo-interstitial nephritis, not specified as acute or chronic: N12

## 2011-11-19 HISTORY — DX: Urinary tract infection, site not specified: N39.0

## 2011-11-19 HISTORY — DX: Trichomoniasis, unspecified: A59.9

## 2011-11-19 LAB — WET PREP, GENITAL
Clue Cells Wet Prep HPF POC: NONE SEEN
Trich, Wet Prep: NONE SEEN

## 2011-11-19 MED ORDER — FLUCONAZOLE 150 MG PO TABS
150.0000 mg | ORAL_TABLET | Freq: Once | ORAL | Status: AC
  Administered 2011-11-19: 150 mg via ORAL
  Filled 2011-11-19: qty 1

## 2011-11-19 MED ORDER — NYSTATIN-TRIAMCINOLONE 100000-0.1 UNIT/GM-% EX OINT: TOPICAL_OINTMENT | Freq: Two times a day (BID) | CUTANEOUS | Status: DC

## 2011-11-19 NOTE — ED Provider Notes (Signed)
History     CSN: [id number]  Arrival date & time 11/19/11  1539   None     Chief Complaint  Patient presents with  . Vaginal Discharge   HPI ROSMARIE Flynn is a 31 y.o. female who presents to [facility] for vaginal discharge and itching that started a week ago. She went to her PCP (Dr. Ronne Binning) but he only had males working in his office and did not do a pelvic exam. Was treated for yeast infection but the patient wants to be sure there is no other infection. The history was provided by the patient.  Past Medical History  Diagnosis Date  . Bronchitis   . Herpes genitalis   . Asthma   . Hydrosalpinx   . Urinary tract infection   . Pyelonephritis   . PID (pelvic inflammatory disease)   . Chlamydia   . Trichomonas     Past Surgical History  Procedure Date  . Cesarean section   . Wisdom tooth extraction   . Ankle surgery     Family History  Problem Relation Age of Onset  . Anesthesia problems Neg Hx     History  Substance Use Topics  . Smoking status: Current Everyday Smoker -- 0.5 packs/day for 17 years  . Smokeless tobacco: Never Used  . Alcohol Use: Yes     occ    OB History    Grav Para Term Preterm Abortions TAB SAB Ect Mult Living   2 2 2       2       Review of Systems  Constitutional: Negative for fever, chills, diaphoresis and fatigue.  HENT: Negative for ear pain, congestion, sore throat, facial swelling, neck pain, neck stiffness, dental problem and sinus pressure.   Eyes: Negative for photophobia, pain and discharge.  Respiratory: Negative for cough, chest tightness and wheezing.   Gastrointestinal: Negative for nausea, vomiting, abdominal pain, diarrhea, constipation and abdominal distention.  Genitourinary: Positive for vaginal discharge and vaginal pain. Negative for dysuria, frequency, flank pain, vaginal bleeding and difficulty urinating.  Musculoskeletal: Negative for myalgias, back pain and gait problem.  Skin: Negative for color change and rash.    Neurological: Negative for dizziness, speech difficulty, weakness, light-headedness, numbness and headaches.  Psychiatric/Behavioral: Negative for confusion and agitation.    Allergies  Penicillins  Home Medications  No current outpatient prescriptions on file.  BP 122/83  Pulse 61  Temp(Src) 98.3 F (36.8 C) (Oral)  Resp 20  Ht 5\' 5"  (1.651 m)  Wt 239 lb 6.4 oz (108.591 kg)  BMI 39.84 kg/m2  SpO2 98%  LMP 10/18/2011  Physical Exam  Nursing note and vitals reviewed. Constitutional: She is oriented to person, place, and time. She appears well-developed and well-nourished.  HENT:  Head: Normocephalic.  Eyes: EOM are normal.  Neck: Neck supple.  Cardiovascular: Normal rate.   Pulmonary/Chest: Effort normal.  Abdominal: Soft. There is no tenderness.  Genitourinary:       External genitalia without lesions. Thick yellow clumpy discharge vaginal vault. Cervix inflamed, no CMT, no adnexal tenderness. Uterus without palpable enlargement.  Musculoskeletal: Normal range of motion.  Neurological: She is alert and oriented to person, place, and time. No cranial nerve deficit.  Skin: Skin is warm and dry.  Psychiatric: She has a normal mood and affect. Her behavior is normal. Judgment and thought content normal.   Results for orders placed during the hospital encounter of 11/19/11 (from the past 24 hour(s))  WET PREP, GENITAL  Status: Abnormal   Collection Time   11/19/11  5:16 PM      Component Value Range   Yeast, Wet Prep MODERATE (*) NONE SEEN    Trich, Wet Prep NONE SEEN  NONE SEEN    Clue Cells, Wet Prep NONE SEEN  NONE SEEN    WBC, Wet Prep HPF POC MODERATE (*) NONE SEEN   POCT PREGNANCY, URINE     Status: Normal   Collection Time   11/19/11  5:54 PM      Component Value Range   Preg Test, Ur NEGATIVE     Assessment: Monilia vaginitis  Plan:  GC, Chlamydia cultures pending   Diflucan 150 mg   Mycolog cream   Return as needed ED Course  Procedures  MDM           Kerrie Buffalo, NP 11/19/11 1804

## 2011-11-19 NOTE — ED Provider Notes (Signed)
Attestation of Attending Supervision of Advanced Practitioner: Evaluation and management procedures were performed by the PA/NP/CNM/OB Fellow under my supervision/collaboration. Chart reviewed, and agree with management and plan.  Jaynie Collins, M.D. 11/19/2011 6:39 PM

## 2011-11-19 NOTE — ED Notes (Signed)
Tolerated meds. Pt for d/c

## 2011-11-19 NOTE — Progress Notes (Signed)
Patient states she was seen in [facility] about one month ago and treated for UTI. Went to Dr. Ronne Binning yesterday for a vaginal discharge that has gotten worse. Was given Diflucan and states it does not help. Has become green and itching.

## 2011-11-19 NOTE — Progress Notes (Signed)
Saw [facility] 01/02, went in for d/c.  Did not do pelvic, did UA only.  Was given rx for Diflucan.  Took dose last night. Now symptoms seem worse, d/c has changed colors.  No burning, no itching or pain.

## 2011-11-19 NOTE — ED Notes (Signed)
NP in explaining dx, plan and treatment. Pt medicated. To lobby to wait for reassess.

## 2011-11-20 LAB — GC/CHLAMYDIA PROBE AMP, GENITAL: Chlamydia, DNA Probe: NEGATIVE

## 2012-03-21 ENCOUNTER — Encounter (HOSPITAL_COMMUNITY): Payer: Self-pay | Admitting: Emergency Medicine

## 2012-03-21 ENCOUNTER — Emergency Department (HOSPITAL_COMMUNITY)
Admission: EM | Admit: 2012-03-21 | Discharge: 2012-03-21 | Disposition: A | Discharge status: Home / Self Care | Payer: Medicaid Other | Attending: Emergency Medicine | Admitting: Emergency Medicine

## 2012-03-21 DIAGNOSIS — H6690 Otitis media, unspecified, unspecified ear: Secondary | ICD-10-CM | POA: Insufficient documentation

## 2012-03-21 DIAGNOSIS — H6691 Otitis media, unspecified, right ear: Secondary | ICD-10-CM

## 2012-03-21 DIAGNOSIS — J45909 Unspecified asthma, uncomplicated: Secondary | ICD-10-CM | POA: Insufficient documentation

## 2012-03-21 MED ORDER — AZITHROMYCIN 250 MG PO TABS: 250.0000 mg | ORAL_TABLET | Freq: Every day | ORAL | Status: AC

## 2012-03-21 MED ORDER — IBUPROFEN 600 MG PO TABS: 600.0000 mg | ORAL_TABLET | Freq: Four times a day (QID) | ORAL | Status: AC | PRN

## 2012-03-21 NOTE — ED Provider Notes (Signed)
History     CSN: [id number]  Arrival date & time 03/21/12  2023   First MD Initiated Contact with Patient 03/21/12 2206      Chief Complaint  Patient presents with  . Otalgia    (Consider location/radiation/quality/duration/timing/severity/associated sxs/prior treatment) HPI Comments: Patient here with right ear pain for 1-1/2 weeks. States saw PCP 1 week ago told her to take Tylenol for this. States has been taking Tylenol without relief. Now has a lump that has developed below the right ear. Denies decrease in hearing, nausea, vomiting, dizziness.  Patient is a 31 y.o. female presenting with ear pain. The history is provided by the patient. No language interpreter was used.  Otalgia This is a new problem. The current episode started more than 1 week ago. There is pain in the right ear. The problem occurs constantly. The problem has not changed since onset.There has been no fever. The pain is at a severity of 5/10. The pain is moderate. Pertinent negatives include no ear discharge, no headaches, no hearing loss, no rhinorrhea, no sore throat, no abdominal pain, no diarrhea, no vomiting, no neck pain, no cough and no rash.    Past Medical History  Diagnosis Date  . Bronchitis   . Herpes genitalis   . Asthma   . Hydrosalpinx   . Urinary tract infection   . Pyelonephritis   . PID (pelvic inflammatory disease)   . Chlamydia   . Trichomonas     Past Surgical History  Procedure Date  . Cesarean section   . Wisdom tooth extraction   . Ankle surgery     Family History  Problem Relation Age of Onset  . Anesthesia problems Neg Hx     History  Substance Use Topics  . Smoking status: Current Everyday Smoker -- 0.5 packs/day for 17 years  . Smokeless tobacco: Never Used  . Alcohol Use: Yes     occ    OB History    Grav Para Term Preterm Abortions TAB SAB Ect Mult Living   2 2 2       2       Review of Systems  HENT: Positive for ear pain. Negative for hearing loss,  sore throat, rhinorrhea, neck pain and ear discharge.   Respiratory: Negative for cough.   Gastrointestinal: Negative for vomiting, abdominal pain and diarrhea.  Skin: Negative for rash.  Neurological: Negative for headaches.  All other systems reviewed and are negative.    Allergies  Penicillins  Home Medications   Current Outpatient Rx  Name Route Sig Dispense Refill  . ALBUTEROL SULFATE HFA 108 (90 BASE) MCG/ACT IN AERS Inhalation Inhale 2 puffs into the lungs daily as needed. For wheezing      BP 144/92  Pulse 74  Temp(Src) 98.3 F (36.8 C) (Oral)  Resp 19  SpO2 100%  LMP 01/28/2012  Physical Exam  Nursing note and vitals reviewed. Constitutional: She is oriented to person, place, and time. She appears well-developed and well-nourished. No distress.  HENT:  Head: Normocephalic and atraumatic.  Left Ear: External ear normal.  Nose: Nose normal.  Mouth/Throat: Oropharynx is clear and moist. No oropharyngeal exudate.       Mild erythema to right TM without bulging no drainage noted.  Eyes: Conjunctivae are normal. Pupils are equal, round, and reactive to light. No scleral icterus.  Neck: Normal range of motion. Neck supple. No thyromegaly present.       Right anterior cervical lymphadenopathy  Cardiovascular: Normal rate, regular  rhythm and normal heart sounds.  Exam reveals no gallop and no friction rub.   No murmur heard. Pulmonary/Chest: Effort normal and breath sounds normal. No respiratory distress. She has no wheezes. She has no rales. She exhibits no tenderness.  Abdominal: Soft. Bowel sounds are normal. She exhibits no distension. There is no tenderness.  Musculoskeletal: Normal range of motion. She exhibits no edema and no tenderness.  Lymphadenopathy:    She has cervical adenopathy.  Neurological: She is alert and oriented to person, place, and time. No cranial nerve deficit.  Skin: Skin is warm and dry. No rash noted. No erythema. No pallor.  Psychiatric:  She has a normal mood and affect. Her behavior is normal. Judgment and thought content normal.    ED Course  Procedures (including critical care time)  Labs Reviewed - No data to display No results found.   Right otitis media   MDM  Patient here with mild erythema to right in place on antibiotics and ibuprofen for pain patient to followup with Dr. Ronne Binning if needed.        Izola Price Schubert, [place] 03/21/12 2307

## 2012-03-21 NOTE — Discharge Instructions (Signed)
Otitis Media, Adult  A middle ear infection is an infection in the space behind the eardrum. The medical name for this is "otitis media." It may happen after a common cold. It is caused by a germ that starts growing in that space. You may feel swollen glands in your neck on the side of the ear infection.  HOME CARE INSTRUCTIONS   · Take your medicine as directed until it is gone, even if you feel better after the first few days.  · Only take over-the-counter or prescription medicines for pain, discomfort, or fever as directed by your caregiver.  · Occasional use of a nasal decongestant a couple times per day may help with discomfort and help the eustachian tube to drain better.  Follow up with your caregiver in 10 to 14 days or as directed, to be certain that the infection has cleared. Not keeping the appointment could result in a chronic or permanent injury, pain, hearing loss and disability. If there is any problem keeping the appointment, you must call back to this facility for assistance.  SEEK IMMEDIATE MEDICAL CARE IF:   · You are not getting better in 2 to 3 days.  · You have pain that is not controlled with medication.  · You feel worse instead of better.  · You cannot use the medication as directed.  · You develop swelling, redness or pain around the ear or stiffness in your neck.  MAKE SURE YOU:   · Understand these instructions.  · Will watch your condition.  · Will get help right away if you are not doing well or get worse.  Document Released: 08/07/2004 Document Revised: 10/22/2011 Document Reviewed: 06/08/2008  ExitCare® Patient Information ©2012 [employer].

## 2012-03-21 NOTE — ED Notes (Signed)
PT. REPORTS PERSISTENT RIGHT EAR ACHE , SEEN BY PCP LAST Thursday WITH NO DEFINITE DIAGNOSIS , DENIES DRAINAGE.

## 2012-03-21 NOTE — ED Notes (Signed)
Pt c/o right ear pain. Lymph node is swollen and tender behind ear. Pt states it hurts to even move her jaw. Pt states she is having  Minor trouble hearing in right ear and is more sensitive to sound.

## 2012-03-22 NOTE — ED Provider Notes (Signed)
Medical screening examination/treatment/procedure(s) were performed by non-physician practitioner and as supervising physician I was immediately available for consultation/collaboration.   Carleene Cooper III, MD 03/22/12 1106

## 2012-04-13 ENCOUNTER — Inpatient Hospital Stay (HOSPITAL_COMMUNITY)
Admission: AD | Admit: 2012-04-13 | Discharge: 2012-04-13 | Disposition: A | Discharge status: Home / Self Care | Payer: Medicaid Other | Source: Ambulatory Visit | Attending: Obstetrics and Gynecology | Admitting: Obstetrics and Gynecology

## 2012-04-13 ENCOUNTER — Encounter (HOSPITAL_COMMUNITY): Payer: Self-pay | Admitting: *Deleted

## 2012-04-13 DIAGNOSIS — N912 Amenorrhea, unspecified: Secondary | ICD-10-CM | POA: Insufficient documentation

## 2012-04-13 DIAGNOSIS — N760 Acute vaginitis: Secondary | ICD-10-CM | POA: Insufficient documentation

## 2012-04-13 LAB — URINALYSIS, ROUTINE W REFLEX MICROSCOPIC
Glucose, UA: NEGATIVE mg/dL
Ketones, ur: NEGATIVE mg/dL
Protein, ur: NEGATIVE mg/dL
Urobilinogen, UA: 0.2 mg/dL (ref 0.0–1.0)

## 2012-04-13 LAB — WET PREP, GENITAL
Trich, Wet Prep: NONE SEEN
Yeast Wet Prep HPF POC: NONE SEEN

## 2012-04-13 LAB — URINE MICROSCOPIC-ADD ON

## 2012-04-13 LAB — POCT PREGNANCY, URINE: Preg Test, Ur: NEGATIVE

## 2012-04-13 MED ORDER — FLUCONAZOLE 150 MG PO TABS: 150.0000 mg | ORAL_TABLET | Freq: Once | ORAL | Status: AC

## 2012-04-13 MED ORDER — METRONIDAZOLE 500 MG PO TABS: 500.0000 mg | ORAL_TABLET | Freq: Two times a day (BID) | ORAL | Status: AC

## 2012-04-13 NOTE — Progress Notes (Signed)
Speculum exam done cultures obtained

## 2012-04-13 NOTE — [facility] Note (Signed)
Pt states her periods are irregular and pt states she has a lot of UTI, "irregular periods run in my family"

## 2012-04-13 NOTE — Discharge Instructions (Signed)
Bacterial Vaginosis Bacterial vaginosis (BV) is a vaginal infection where the normal balance of bacteria in the vagina is disrupted. The normal balance is then replaced by an overgrowth of certain bacteria. There are several different kinds of bacteria that can cause BV. BV is the most common vaginal infection in women of childbearing age. CAUSES   The cause of BV is not fully understood. BV develops when there is an increase or imbalance of harmful bacteria.   Some activities or behaviors can upset the normal balance of bacteria in the vagina and put women at increased risk including:   Having a new sex partner or multiple sex partners.   Douching.   Using an intrauterine device (IUD) for contraception.   It is not clear what role sexual activity plays in the development of BV. However, women that have never had sexual intercourse are rarely infected with BV.  Women do not get BV from toilet seats, bedding, swimming pools or from touching objects around them.  SYMPTOMS   Grey vaginal discharge.   A fish-like odor with discharge, especially after sexual intercourse.   Itching or burning of the vagina and vulva.   Burning or pain with urination.   Some women have no signs or symptoms at all.  DIAGNOSIS  Your caregiver must examine the vagina for signs of BV. Your caregiver will perform lab tests and look at the sample of vaginal fluid through a microscope. They will look for bacteria and abnormal cells (clue cells), a pH test higher than 4.5, and a positive amine test all associated with BV.  RISKS AND COMPLICATIONS   Pelvic inflammatory disease (PID).   Infections following gynecology surgery.   Developing HIV.   Developing herpes virus.  TREATMENT  Sometimes BV will clear up without treatment. However, all women with symptoms of BV should be treated to avoid complications, especially if gynecology surgery is planned. Female partners generally do not need to be treated. However,  BV may spread between female sex partners so treatment is helpful in preventing a recurrence of BV.   BV may be treated with antibiotics. The antibiotics come in either pill or vaginal cream forms. Either can be used with nonpregnant or pregnant women, but the recommended dosages differ. These antibiotics are not harmful to the baby.   BV can recur after treatment. If this happens, a second round of antibiotics will often be prescribed.   Treatment is important for pregnant women. If not treated, BV can cause a premature delivery, especially for a pregnant woman who had a premature birth in the past. All pregnant women who have symptoms of BV should be checked and treated.   For chronic reoccurrence of BV, treatment with a type of prescribed gel vaginally twice a week is helpful.  HOME CARE INSTRUCTIONS   Finish all medication as directed by your caregiver.   Do not have sex until treatment is completed.   Tell your sexual partner that you have a vaginal infection. They should see their caregiver and be treated if they have problems, such as a mild rash or itching.   Practice safe sex. Use condoms. Only have 1 sex partner.  PREVENTION  Basic prevention steps can help reduce the risk of upsetting the natural balance of bacteria in the vagina and developing BV:  Do not have sexual intercourse (be abstinent).   Do not douche.   Use all of the medicine prescribed for treatment of BV, even if the signs and symptoms go away.     Tell your sex partner if you have BV. That way, they can be treated, if needed, to prevent reoccurrence.  SEEK MEDICAL CARE IF:   Your symptoms are not improving after 3 days of treatment.   You have increased discharge, pain, or fever.  MAKE SURE YOU:   Understand these instructions.   Will watch your condition.   Will get help right away if you are not doing well or get worse.  FOR MORE INFORMATION  Division of STD Prevention (DSTDP), Centers for Disease  Control and Prevention: www.cdc.gov/std American Social Health Association (ASHA): www.ashastd.org  Document Released: 11/02/2005 Document Revised: 10/22/2011 Document Reviewed: 04/25/2009 ExitCare Patient Information 2012 [employer]. 

## 2012-04-13 NOTE — [facility] Provider Note (Signed)
Chief Complaint:  Amenorrhea    First Provider Initiated Contact with Patient 04/13/12 2215      Rebecca Flynn is  31 y.o. [id number].  Patient's last menstrual period was 01/28/2012.Marland Kitchen  Her pregnancy status is negative.  She presents complaining of Amenorrhea  Onset is described as on-going and has been present for  3 months. Reports intermittent sharp abd pain that lasts for < 60 seconds. Recently treated for ear infection. Concerned she may have UTI. Denies vaginal discharge, dysuria, low back pain, fever, chils, nausea or vomiting.   Obstetrical/Gynecological History: OB History    Grav Para Term Preterm Abortions TAB SAB Ect Mult Living   2 2 2       2       Past Medical History: Past Medical History  Diagnosis Date  . Bronchitis   . Herpes genitalis   . Asthma   . Hydrosalpinx   . Urinary tract infection   . Pyelonephritis   . PID (pelvic inflammatory disease)   . Chlamydia   . Trichomonas     Past Surgical History: Past Surgical History  Procedure Date  . Cesarean section   . Wisdom tooth extraction   . Ankle surgery     Family History: Family History  Problem Relation Age of Onset  . Anesthesia problems Neg Hx     Social History: History  Substance Use Topics  . Smoking status: Current Everyday Smoker -- 0.5 packs/day for 17 years  . Smokeless tobacco: Never Used  . Alcohol Use: Yes     occ    Allergies:  Allergies  Allergen Reactions  . Penicillins Nausea And Vomiting and Rash    Prescriptions prior to admission  Medication Sig Dispense Refill  . acetaminophen (TYLENOL) 500 MG tablet Take 500 mg by mouth every 6 (six) hours as needed. cramps      . albuterol (PROVENTIL HFA;VENTOLIN HFA) 108 (90 BASE) MCG/ACT inhaler Inhale 2 puffs into the lungs daily as needed. For wheezing        Review of Systems - Negative except what has been reviewed in the HPI  Physical Exam   Blood pressure 122/80, pulse 67, temperature 98.5 F (36.9 C), temperature  source Oral, resp. rate 18, last menstrual period 01/28/2012, SpO2 97.00%.  General: General appearance - alert, well appearing, and in no distress, oriented to person, place, and time and overweight Mental status - alert, oriented to person, place, and time, normal mood, behavior, speech, dress, motor activity, and thought processes, anxious Abdomen - soft, nontender, nondistended, no masses or organomegaly no CVA tenderness Focused Gynecological Exam: VULVA: normal appearing vulva with no masses, tenderness or lesions, VAGINA: vaginal discharge - white, copious and curd-like, CERVIX: normal appearing cervix without discharge or lesions, UTERUS: uterus is normal size, shape, consistency and nontender, ADNEXA: normal adnexa in size, nontender and no masses  Labs: Recent Results (from the past 24 hour(s))  URINALYSIS, ROUTINE W REFLEX MICROSCOPIC   Collection Time   04/13/12  9:40 PM      Component Value Range   Color, Urine YELLOW  YELLOW    APPearance CLEAR  CLEAR    Specific Gravity, Urine 1.025  1.005 - 1.030    pH 6.0  5.0 - 8.0    Glucose, UA NEGATIVE  NEGATIVE (mg/dL)   Hgb urine dipstick SMALL (*) NEGATIVE    Bilirubin Urine NEGATIVE  NEGATIVE    Ketones, ur NEGATIVE  NEGATIVE (mg/dL)   Protein, ur NEGATIVE  NEGATIVE (mg/dL)  Urobilinogen, UA 0.2  0.0 - 1.0 (mg/dL)   Nitrite NEGATIVE  NEGATIVE    Leukocytes, UA MODERATE (*) NEGATIVE   URINE MICROSCOPIC-ADD ON   Collection Time   04/13/12  9:40 PM      Component Value Range   Squamous Epithelial / LPF FEW (*) RARE    WBC, UA 7-10  <3 (WBC/hpf)   RBC / HPF 3-6  <3 (RBC/hpf)   Bacteria, UA MANY (*) RARE   POCT PREGNANCY, URINE   Collection Time   04/13/12  9:59 PM      Component Value Range   Preg Test, Ur NEGATIVE  NEGATIVE   WET PREP, GENITAL   Collection Time   04/13/12 10:25 PM      Component Value Range   Yeast Wet Prep HPF POC NONE SEEN  NONE SEEN    Trich, Wet Prep NONE SEEN  NONE SEEN    Clue Cells Wet Prep HPF  POC MODERATE (*) NONE SEEN    WBC, Wet Prep HPF POC FEW (*) NONE SEEN    Assessment: Vaginitis  Plan: Discharge home Rx Flagyl and Diflucan Urine sent for culture FU with PCP  Rebecca Flynn. 04/13/2012,10:45 PM

## 2012-04-14 LAB — GC/CHLAMYDIA PROBE AMP, GENITAL
Chlamydia, DNA Probe: NEGATIVE
GC Probe Amp, Genital: NEGATIVE

## 2012-04-25 NOTE — [facility] Provider Note (Signed)
Attestation of Attending Supervision of Advanced Practitioner: Evaluation and management procedures were performed by the PA/NP/CNM/OB Fellow under my supervision/collaboration. Chart reviewed and agree with management and plan.  Shloimy Michalski V 04/25/2012 1:49 PM

## 2012-09-20 ENCOUNTER — Inpatient Hospital Stay (HOSPITAL_COMMUNITY)
Admission: AD | Admit: 2012-09-20 | Discharge: 2012-09-20 | Disposition: A | Discharge status: Home / Self Care | Payer: Medicaid Other | Source: Ambulatory Visit | Attending: Family Medicine | Admitting: Family Medicine

## 2012-09-20 ENCOUNTER — Encounter (HOSPITAL_COMMUNITY): Payer: Self-pay | Admitting: *Deleted

## 2012-09-20 DIAGNOSIS — R102 Pelvic and perineal pain: Secondary | ICD-10-CM

## 2012-09-20 DIAGNOSIS — N949 Unspecified condition associated with female genital organs and menstrual cycle: Secondary | ICD-10-CM

## 2012-09-20 DIAGNOSIS — R109 Unspecified abdominal pain: Secondary | ICD-10-CM | POA: Insufficient documentation

## 2012-09-20 DIAGNOSIS — N921 Excessive and frequent menstruation with irregular cycle: Secondary | ICD-10-CM

## 2012-09-20 DIAGNOSIS — N946 Dysmenorrhea, unspecified: Secondary | ICD-10-CM | POA: Insufficient documentation

## 2012-09-20 DIAGNOSIS — N938 Other specified abnormal uterine and vaginal bleeding: Secondary | ICD-10-CM | POA: Insufficient documentation

## 2012-09-20 LAB — URINE MICROSCOPIC-ADD ON

## 2012-09-20 LAB — URINALYSIS, ROUTINE W REFLEX MICROSCOPIC
Bilirubin Urine: NEGATIVE
Ketones, ur: NEGATIVE mg/dL
Leukocytes, UA: NEGATIVE
Nitrite: NEGATIVE
Protein, ur: NEGATIVE mg/dL
Urobilinogen, UA: 0.2 mg/dL (ref 0.0–1.0)
pH: 5.5 (ref 5.0–8.0)

## 2012-09-20 LAB — WET PREP, GENITAL: Trich, Wet Prep: NONE SEEN

## 2012-09-20 MED ORDER — FLUCONAZOLE 150 MG PO TABS
150.0000 mg | ORAL_TABLET | Freq: Once | ORAL | Status: AC
  Administered 2012-09-20: 150 mg via ORAL
  Filled 2012-09-20: qty 1

## 2012-09-20 MED ORDER — ONDANSETRON 4 MG PO TBDP
4.0000 mg | ORAL_TABLET | Freq: Once | ORAL | Status: DC
Start: 2012-09-20 — End: 2012-09-21
  Filled 2012-09-20: qty 1

## 2012-09-20 MED ORDER — AZITHROMYCIN 250 MG PO TABS
1000.0000 mg | ORAL_TABLET | Freq: Once | ORAL | Status: AC
  Administered 2012-09-20: 1000 mg via ORAL
  Filled 2012-09-20: qty 4

## 2012-09-20 MED ORDER — CEFTRIAXONE SODIUM 250 MG IJ SOLR
250.0000 mg | Freq: Once | INTRAMUSCULAR | Status: AC
  Administered 2012-09-20: 250 mg via INTRAMUSCULAR
  Filled 2012-09-20: qty 250

## 2012-09-20 NOTE — MAU Provider Note (Addendum)
History     CSN: [id number]  Arrival date and time: 09/20/12 1916   None     Chief Complaint  Patient presents with  . Abdominal Pain   HPI 31 y.o. [id number] with vaginal spotting and abdominal pain. Crampy bilateral pelvic/abdominal pain. Feels like bladder infection. No dysuria, frequency. Cramping started Friday (4 days ago). Constant today. Took Goody powder - helped some. Spotting started same time - some bright blood, some dark. No discharge. LMP 10/11 - lasted 8 days, heavy bleeding.  Menarche - 31 yo. Irregular. Usually around every 2 months, 8 days, heavy flow. Dysmenorrhea, severe first few days. No birth control.   Last pap smear one year ago, never had abnormal. Hx STDs, "a few months ago."  Had PID June 2012, gonorrhea 2010.  OB History    Grav Para Term Preterm Abortions TAB SAB Ect Mult Living   2 2 2       2       Past Medical History  Diagnosis Date  . Bronchitis   . Herpes genitalis   . Asthma   . Hydrosalpinx   . Urinary tract infection   . Pyelonephritis   . PID (pelvic inflammatory disease)   . Chlamydia   . Trichomonas     Past Surgical History  Procedure Date  . Cesarean section   . Wisdom tooth extraction   . Ankle surgery     Family History  Problem Relation Age of Onset  . Anesthesia problems Neg Hx     History  Substance Use Topics  . Smoking status: Current Every Day Smoker -- 0.5 packs/day for 17 years    Types: Cigarettes  . Smokeless tobacco: Never Used  . Alcohol Use: Yes     Comment: occ    Allergies:  Allergies  Allergen Reactions  . Penicillins Nausea And Vomiting and Rash    Prescriptions prior to admission  Medication Sig Dispense Refill  . albuterol (PROVENTIL HFA;VENTOLIN HFA) 108 (90 BASE) MCG/ACT inhaler Inhale 2 puffs into the lungs daily as needed. For wheezing      . OVER THE COUNTER MEDICATION Take 1 packet by mouth as needed. Patient used Goody's Powder for pain        Review of Systems    Constitutional: Negative for fever, chills and weight loss.  Gastrointestinal: Positive for abdominal pain. Negative for vomiting, diarrhea and constipation.  Genitourinary: Negative for dysuria, urgency, frequency and hematuria.   Physical Exam   Blood pressure 116/78, pulse 52, temperature 97.9 F (36.6 C), temperature source Oral, resp. rate 20, height 5' 4.5" (1.638 m), weight 111.131 kg (245 lb), last menstrual period 08/30/2012, SpO2 99.00%.  Physical Exam  Constitutional: She is oriented to person, place, and time. She appears well-developed and well-nourished. No distress.  HENT:  Head: Normocephalic and atraumatic.  Eyes: Conjunctivae normal and EOM are normal.  Neck: Normal range of motion. Neck supple.  Cardiovascular: Normal rate, regular rhythm and normal heart sounds.   Respiratory: Effort normal and breath sounds normal. No respiratory distress.  GI: Soft. She exhibits no distension. There is tenderness (LLQ, mild). There is no rebound and no guarding.  Genitourinary:       Normal external genitalia. Normal vagina. Brownish discharge. Moderate CMT. Difficult to assess uterine size/position due to body habitus. Mild left adnexal tenderness. No masses appreciated but limited by body habitus.  Musculoskeletal: Normal range of motion. She exhibits no edema and no tenderness.  Neurological: She is alert and oriented to  person, place, and time.  Skin: Skin is warm and dry.       Extensive tattoos  Psychiatric: She has a normal mood and affect.   Results for orders placed during the hospital encounter of 09/20/12 (from the past 24 hour(s))  URINALYSIS, ROUTINE W REFLEX MICROSCOPIC     Status: Abnormal   Collection Time   09/20/12  7:41 PM      Component Value Range   Color, Urine YELLOW  YELLOW   APPearance CLOUDY (*) CLEAR   Specific Gravity, Urine >1.030 (*) 1.005 - 1.030   pH 5.5  5.0 - 8.0   Glucose, UA NEGATIVE  NEGATIVE mg/dL   Hgb urine dipstick LARGE (*) NEGATIVE    Bilirubin Urine NEGATIVE  NEGATIVE   Ketones, ur NEGATIVE  NEGATIVE mg/dL   Protein, ur NEGATIVE  NEGATIVE mg/dL   Urobilinogen, UA 0.2  0.0 - 1.0 mg/dL   Nitrite NEGATIVE  NEGATIVE   Leukocytes, UA NEGATIVE  NEGATIVE  URINE MICROSCOPIC-ADD ON     Status: Abnormal   Collection Time   09/20/12  7:41 PM      Component Value Range   Squamous Epithelial / LPF MANY (*) RARE   WBC, UA 3-6  <3 WBC/hpf   RBC / HPF 0-2  <3 RBC/hpf   Bacteria, UA FEW (*) RARE  POCT PREGNANCY, URINE     Status: Normal   Collection Time   09/20/12  7:56 PM      Component Value Range   Preg Test, Ur NEGATIVE  NEGATIVE  WET PREP, GENITAL     Status: Abnormal   Collection Time   09/20/12  8:46 PM      Component Value Range   Yeast Wet Prep HPF POC MANY (*) NONE SEEN   Trich, Wet Prep NONE SEEN  NONE SEEN   Clue Cells Wet Prep HPF POC NONE SEEN  NONE SEEN   WBC, Wet Prep HPF POC MANY (*) NONE SEEN     MAU Course  Procedures   Assessment and Plan  31 y.o. [id number] with cramping, spotting, irregular bleeding. - given hx PID, will treat for GC/Chlamydia per pt request - Diflucan for yeast - F/U urine culture - F/U with GYN for irregular bleeding.  Napoleon Form 09/20/2012, 8:30 PM

## 2012-09-20 NOTE — MAU Note (Signed)
Pt reports lower abd cramping x 3 days, nausea. LMP 08/30/2012, condoms for birth control.

## 2012-09-21 LAB — GC/CHLAMYDIA PROBE AMP, GENITAL
Chlamydia, DNA Probe: NEGATIVE
GC Probe Amp, Genital: NEGATIVE

## 2012-09-22 LAB — URINE CULTURE

## 2012-09-27 ENCOUNTER — Telehealth: Payer: Self-pay | Admitting: Medical

## 2012-09-27 DIAGNOSIS — O2340 Unspecified infection of urinary tract in pregnancy, unspecified trimester: Secondary | ICD-10-CM

## 2012-09-27 DIAGNOSIS — N390 Urinary tract infection, site not specified: Secondary | ICD-10-CM

## 2012-09-27 MED ORDER — SULFAMETHOXAZOLE-TRIMETHOPRIM 800-160 MG PO TABS: 1.0000 | ORAL_TABLET | Freq: Two times a day (BID) | ORAL | Status: DC

## 2012-09-27 NOTE — Telephone Encounter (Signed)
Spoke with patient. Informed her of UTI and need for Rx. Patient would like Rx sent to [facility] on [address] in [place]. I called in Rx at 9:40 am. Patient voiced no other questions or concerns at this time.

## 2012-09-27 NOTE — Telephone Encounter (Signed)
Message copied by Freddi Starr on Tue Sep 27, 2012  9:17 AM ------      Message from: Reva Bores      Created: Thu Sep 22, 2012  9:59 AM       Growing E-coli in urine--needs treatment with septra ds bid x 3 days

## 2012-11-21 ENCOUNTER — Encounter (HOSPITAL_COMMUNITY): Payer: Self-pay | Admitting: *Deleted

## 2012-11-21 ENCOUNTER — Inpatient Hospital Stay (HOSPITAL_COMMUNITY)
Admission: AD | Admit: 2012-11-21 | Discharge: 2012-11-21 | Disposition: A | Discharge status: Home / Self Care | Payer: Medicaid Other | Source: Ambulatory Visit | Attending: Obstetrics & Gynecology | Admitting: Obstetrics & Gynecology

## 2012-11-21 DIAGNOSIS — R109 Unspecified abdominal pain: Secondary | ICD-10-CM | POA: Insufficient documentation

## 2012-11-21 DIAGNOSIS — N949 Unspecified condition associated with female genital organs and menstrual cycle: Secondary | ICD-10-CM | POA: Insufficient documentation

## 2012-11-21 DIAGNOSIS — N938 Other specified abnormal uterine and vaginal bleeding: Secondary | ICD-10-CM

## 2012-11-21 LAB — CBC
HCT: 41.1 % (ref 36.0–46.0)
MCH: 28.3 pg (ref 26.0–34.0)
MCV: 87.4 fL (ref 78.0–100.0)
Platelets: 379 10*3/uL (ref 150–400)
RDW: 13.9 % (ref 11.5–15.5)
WBC: 11.1 10*3/uL — ABNORMAL HIGH (ref 4.0–10.5)

## 2012-11-21 LAB — WET PREP, GENITAL
Trich, Wet Prep: NONE SEEN
Yeast Wet Prep HPF POC: NONE SEEN

## 2012-11-21 MED ORDER — MEDROXYPROGESTERONE ACETATE 10 MG PO TABS: 10.0000 mg | ORAL_TABLET | Freq: Every day | ORAL | Status: DC

## 2012-11-21 NOTE — MAU Note (Signed)
Patient states she has a history of irregular periods. Last period was in October. Has bleeding every day with only 3 days with no bleeding since that time. Has had abdominal cramping for a while but getting worse for the past 3 days.

## 2012-11-21 NOTE — MAU Provider Note (Signed)
History      CSN: [id number]  Arrival date and time: 11/21/12 1200   None     Chief Complaint  Patient presents with  . Vaginal Bleeding  . Abdominal Cramping   HPI  32 y.o. [id number] presents with vaginal bleeding with clots and abd cramping everyday since last November.  Pt states she did have three days without bleeding or cramping.  Pr reports an increase in vaginal bleeding and cramping over the past 2 weeks and is now changing her pad every 2-3 hrs.  Denies sexual activity over the past 4 months. Denies other discharge, odor, dizziness, or syncope. Has a history of irregular periods, heavy bleeding.   Past Medical History  Diagnosis Date  . Bronchitis   . Herpes genitalis   . Asthma   . Hydrosalpinx   . Urinary tract infection   . Pyelonephritis   . PID (pelvic inflammatory disease)   . Chlamydia   . Trichomonas     Past Surgical History  Procedure Date  . Cesarean section   . Wisdom tooth extraction   . Ankle surgery     Family History  Problem Relation Age of Onset  . Anesthesia problems Neg Hx   . Other Neg Hx     History  Substance Use Topics  . Smoking status: Current Every Day Smoker -- 0.5 packs/day for 17 years    Types: Cigarettes  . Smokeless tobacco: Never Used  . Alcohol Use: Yes     Comment: occ    Allergies:  Allergies  Allergen Reactions  . Penicillins Nausea And Vomiting and Rash    Prescriptions prior to admission  Medication Sig Dispense Refill  . albuterol (PROVENTIL HFA;VENTOLIN HFA) 108 (90 BASE) MCG/ACT inhaler Inhale 2 puffs into the lungs daily as needed. For wheezing      . OVER THE COUNTER MEDICATION Take 1 packet by mouth as needed. Patient used Goody's Powder for pain      . sulfamethoxazole-trimethoprim (SEPTRA DS) 800-160 MG per tablet Take 1 tablet by mouth 2 (two) times daily.  6 tablet  0    Review of Systems  Constitutional: Positive for malaise/fatigue. Negative for fever and chills.  Respiratory: Negative.   Negative for shortness of breath.   Cardiovascular: Negative.   Gastrointestinal: Positive for abdominal pain. Negative for nausea, vomiting, diarrhea and constipation.  Genitourinary: Negative for dysuria, urgency, frequency, hematuria and flank pain.       Vaginal bleeding with clots.  Musculoskeletal: Negative.  Negative for joint pain.  Skin: Negative for rash.  Neurological: Negative.  Negative for dizziness and loss of consciousness.  Endo/Heme/Allergies: Does not bruise/bleed easily.  Psychiatric/Behavioral: Negative.    Physical Exam   Blood pressure 110/89, pulse 72, temperature 97.9 F (36.6 C), temperature source Oral, resp. rate 16, height 5\' 5"  (1.651 m), weight 238 lb (107.956 kg), last menstrual period 08/16/2012, SpO2 100.00%.  Physical Exam  Constitutional: She is oriented to person, place, and time. She appears well-developed and well-nourished.  Eyes: Pupils are equal, round, and reactive to light.  Cardiovascular: Normal rate, regular rhythm, normal heart sounds and intact distal pulses.   Respiratory: Effort normal and breath sounds normal.  GI: Soft. Bowel sounds are normal. There is no tenderness.  Genitourinary: Uterus is not enlarged and not tender. Cervix exhibits no motion tenderness. Right adnexum displays no mass, no tenderness and no fullness. Left adnexum displays no mass, no tenderness and no fullness.       Moderate Vaginal  bleeding  Musculoskeletal: Normal range of motion.  Neurological: She is alert and oriented to person, place, and time. She has normal reflexes.  Skin: Skin is warm and dry.  Psychiatric: She has a normal mood and affect.    MAU Course  Procedures  Results for orders placed during the hospital encounter of 11/21/12 (from the past 24 hour(s))  CBC     Status: Abnormal   Collection Time   11/21/12 12:45 PM      Component Value Range   WBC 11.1 (*) 4.0 - 10.5 K/uL   RBC 4.70  3.87 - 5.11 MIL/uL   Hemoglobin 13.3  12.0 - 15.0 g/dL    HCT 40.9  81.1 - 91.4 %   MCV 87.4  78.0 - 100.0 fL   MCH 28.3  26.0 - 34.0 pg   MCHC 32.4  30.0 - 36.0 g/dL   RDW 78.2  95.6 - 21.3 %   Platelets 379  150 - 400 K/uL     Assessment and Plan   1. DUB (dysfunctional uterine bleeding)   U/S scheduled outpatient, message sent to GYN clinic to schedule follow up appointment    Medication List     As of 11/21/2012  6:08 PM    START taking these medications         medroxyPROGESTERone 10 MG tablet   Commonly known as: PROVERA   Take 1 tablet (10 mg total) by mouth daily.      CONTINUE taking these medications         albuterol 108 (90 BASE) MCG/ACT inhaler   Commonly known as: PROVENTIL HFA;VENTOLIN HFA          Where to get your medications    These are the prescriptions that you need to pick up.   You may get these medications from any pharmacy.         medroxyPROGESTERone 10 MG tablet            Follow-up Information    Schedule an appointment as soon as possible for a visit with [facility]. (Someone will call to schedule, if no one calls you should call to schedule your own appointment)    Contact information:   [address] [place] [address] [phone]      Follow up with [facility] [place] [facility]TRAS[facility]. On 11/29/2012. (Arrive at 1:00 PM with a full bladder)    Contact information:   [address] [id number] mc [place] [address] [phone]           Mahamed Zalewski 11/21/2012, 1:02 PM .

## 2012-11-22 ENCOUNTER — Encounter: Payer: Self-pay | Admitting: Obstetrics & Gynecology

## 2012-11-22 LAB — GC/CHLAMYDIA PROBE AMP
CT Probe RNA: NEGATIVE
GC Probe RNA: NEGATIVE

## 2012-11-29 ENCOUNTER — Ambulatory Visit (HOSPITAL_COMMUNITY)
Admission: RE | Admit: 2012-11-29 | Discharge: 2012-11-29 | Disposition: A | Discharge status: Home / Self Care | Payer: Medicaid Other | Source: Ambulatory Visit | Attending: Family Medicine | Admitting: Family Medicine

## 2012-11-29 ENCOUNTER — Encounter (HOSPITAL_COMMUNITY): Payer: Self-pay

## 2012-11-29 DIAGNOSIS — N949 Unspecified condition associated with female genital organs and menstrual cycle: Secondary | ICD-10-CM | POA: Insufficient documentation

## 2012-11-29 DIAGNOSIS — N938 Other specified abnormal uterine and vaginal bleeding: Secondary | ICD-10-CM | POA: Insufficient documentation

## 2012-11-29 DIAGNOSIS — N7013 Chronic salpingitis and oophoritis: Secondary | ICD-10-CM | POA: Insufficient documentation

## 2012-12-07 ENCOUNTER — Encounter: Payer: Self-pay | Admitting: Obstetrics & Gynecology

## 2012-12-07 ENCOUNTER — Ambulatory Visit (INDEPENDENT_AMBULATORY_CARE_PROVIDER_SITE_OTHER): Payer: Medicaid Other | Admitting: Obstetrics & Gynecology

## 2012-12-07 VITALS — BP 121/83 | HR 80 | Ht 64.5 in | Wt 235.3 lb

## 2012-12-07 DIAGNOSIS — N949 Unspecified condition associated with female genital organs and menstrual cycle: Secondary | ICD-10-CM

## 2012-12-07 DIAGNOSIS — N938 Other specified abnormal uterine and vaginal bleeding: Secondary | ICD-10-CM

## 2012-12-07 MED ORDER — MEGESTROL ACETATE 40 MG PO TABS: ORAL_TABLET | ORAL | Status: DC

## 2012-12-07 NOTE — Patient Instructions (Signed)
Dysfunctional Uterine Bleeding  Normally, menstrual periods begin between ages 11 to 17 in young women. A normal menstrual cycle/period may begin every 23 days up to 35 days and lasts from 1 to 7 days. Around 12 to 14 days before your menstrual period starts, ovulation (ovary produces an egg) occurs. When counting the time between menstrual periods, count from the first day of bleeding of the previous period to the first day of bleeding of the next period.  Dysfunctional (abnormal) uterine bleeding is bleeding that is different from a normal menstrual period. Your periods may come earlier or later than usual. They may be lighter, have blood clots or be heavier. You may have bleeding between periods, or you may skip one period or more. You may have bleeding after sexual intercourse, bleeding after menopause, or no menstrual period.  CAUSES   · Pregnancy (normal, miscarriage, tubal).  · IUDs (intrauterine device, birth control).  · Birth control pills.  · Hormone treatment.  · Menopause.  · Infection of the cervix.  · Blood clotting problems.  · Infection of the inside lining of the uterus.  · Endometriosis, inside lining of the uterus growing in the pelvis and other female organs.  · Adhesions (scar tissue) inside the uterus.  · Obesity or severe weight loss.  · Uterine polyps inside the uterus.  · Cancer of the vagina, cervix, or uterus.  · Ovarian cysts or polycystic ovary syndrome.  · Medical problems (diabetes, thyroid disease).  · Uterine fibroids (noncancerous tumor).  · Problems with your female hormones.  · Endometrial hyperplasia, very thick lining and enlarged cells inside of the uterus.  · Medicines that interfere with ovulation.  · Radiation to the pelvis or abdomen.  · Chemotherapy.  DIAGNOSIS   · Your doctor will discuss the history of your menstrual periods, medicines you are taking, changes in your weight, stress in your life, and any medical problems you may have.  · Your doctor will do a physical  and pelvic examination.  · Your doctor may want to perform certain tests to make a diagnosis, such as:  · Pap test.  · Blood tests.  · Cultures for infection.  · CT scan.  · Ultrasound.  · Hysteroscopy.  · Laparoscopy.  · MRI.  · Hysterosalpingography.  · D and C.  · Endometrial biopsy.  TREATMENT   Treatment will depend on the cause of the dysfunctional uterine bleeding (DUB). Treatment may include:  · Observing your menstrual periods for a couple of months.  · Prescribing medicines for medical problems, including:  · Antibiotics.  · Hormones.  · Birth control pills.  · Removing an IUD (intrauterine device, birth control).  · Surgery:  · D and C (scrape and remove tissue from inside the uterus).  · Laparoscopy (examine inside the abdomen with a lighted tube).  · Uterine ablation (destroy lining of the uterus with electrical current, laser, heat, or freezing).  · Hysteroscopy (examine cervix and uterus with a lighted tube).  · Hysterectomy (remove the uterus).  HOME CARE INSTRUCTIONS   · If medicines were prescribed, take exactly as directed. Do not change or switch medicines without consulting your caregiver.  · Long term heavy bleeding may result in iron deficiency. Your caregiver may have prescribed iron pills. They help replace the iron that your body lost from heavy bleeding. Take exactly as directed.  · Do not take aspirin or medicines that contain aspirin one week before or during your menstrual period. Aspirin may make   the bleeding worse.  · If you need to change your sanitary pad or tampon more than once every 2 hours, stay in bed with your feet elevated and a cold pack on your lower abdomen. Rest as much as possible, until the bleeding stops or slows down.  · Eat well-balanced meals. Eat foods high in iron. Examples are:  · Leafy green vegetables.  · Whole-grain breads and cereals.  · Eggs.  · Meat.  · Liver.  · Do not try to lose weight until the abnormal bleeding has stopped and your blood iron level is  back to normal. Do not lift more than ten pounds or do strenuous activities when you are bleeding.  · For a couple of months, make note on your calendar, marking the start and ending of your period, and the type of bleeding (light, medium, heavy, spotting, clots or missed periods). This is for your caregiver to better evaluate your problem.  SEEK MEDICAL CARE IF:   · You develop nausea (feeling sick to your stomach) and vomiting, dizziness, or diarrhea while you are taking your medicine.  · You are getting lightheaded or weak.  · You have any problems that may be related to the medicine you are taking.  · You develop pain with your DUB.  · You want to remove your IUD.  · You want to stop or change your birth control pills or hormones.  · You have any type of abnormal bleeding mentioned above.  · You are over 16 years old and have not had a menstrual period yet.  · You are 32 years old and you are still having menstrual periods.  · You have any of the symptoms mentioned above.  · You develop a rash.  SEEK IMMEDIATE MEDICAL CARE IF:   · An oral temperature above 102° F (38.9° C) develops.  · You develop chills.  · You are changing your sanitary pad or tampon more than once an hour.  · You develop abdominal pain.  · You [place] out or faint.  Document Released: 10/30/2000 Document Revised: 01/25/2012 Document Reviewed: 10/01/2009  [employer]® Patient Information ©2013 [employer].

## 2012-12-09 DIAGNOSIS — N939 Abnormal uterine and vaginal bleeding, unspecified: Secondary | ICD-10-CM | POA: Insufficient documentation

## 2012-12-09 NOTE — Progress Notes (Signed)
History:  32 y.o. [id number] here today for followup of DUB after evaluation in the MAU on 11/21/12.  Hgb was 13, negative GC/Chlam. Outpatient ultrasound ordered for 11/29/12.  She was started on Provera 10 mg daily which she said did not help. She doubled the dosage but still had significant bleeding. She is here to review ultrasound results and discuss further management.  The following portions of the patient's history were reviewed and updated as appropriate: allergies, current medications, past family history, past medical history, past social history, past surgical history and problem list.  Review of Systems:  Pertinent items are noted in HPI.  Objective:  Physical Exam BP 121/83  Pulse 80  Ht 5' 4.5" (1.638 m)  Wt 235 lb 4.8 oz (106.731 kg)  BMI 39.77 kg/m2  LMP 08/16/2012 Deferred  Labs and Imaging 11/29/2012  TRANSABDOMINAL AND TRANSVAGINAL ULTRASOUND OF PELVIS Clinical Data: Dysfunctional uterine bleeding.  LMP 08/16/2012. Currently on Provera.  Hydrosalpinx.  Prior history of pelvic inflammatory disease and urinary tract infections.  Technique:  Both transabdominal and transvaginal ultrasound examinations of the pelvis were performed.  Transabdominal technique was performed for global imaging of the pelvis including uterus, ovaries, adnexal regions, and pelvic cul-de-sac.  It was necessary to proceed with endovaginal exam following the transabdominal exam to visualize the endometrium and adnexae.  Comparison:  05/05/2011  Findings: Uterus:  9.4 x 4.4 x 5.5 cm.  No fibroids identified.  Endometrium: Double layer thickness measures 15 mm transvaginally. No focal lesion visualized.  Right ovary: 2.4 x 1.9 x 2.1 cm.  Normal appearance.  Left ovary: 2.4 x 2.3 x 2.4 cm.  Normal appearance.  Other Findings:  No free fluid  IMPRESSION:  1.  No evidence of fibroids. 2.  Endometrial thickness measures 15 mm.  If bleeding remains unresponsive to hormonal or medical therapy, sonohysterogram should be  considered for focal lesion work-up. (Ref:  Radiological Reasoning: Algorithmic Workup of Abnormal Vaginal Bleeding with Endovaginal Sonography and Sonohysterography. AJR 2008; 147:W29-56) 3.  Normal ovaries.  No adnexal mass identified.   Original Report Authenticated By: Myles Rosenthal, M.D.    Assessment & Plan:  No focal lesions on ultrasound.  Recommended endometrial biopsy as next step in evaluation, patient declines this for now.  Discussed management options for abnormal uterine bleeding including oral Provera/Megace, Depo Provera, Mirena IUD, endometrial ablation (Novasure/Hydrothermal Ablation/Thermachoice) or hysterectomy as definitive surgical management.  Discussed risks and benefits of each method.  Patient desires to try oral Megace to see if this will give her a better response, this was prescribed for her.  Printed patient education handouts were given to the patient to review at home.  Bleeding precautions reviewed.

## 2013-01-04 ENCOUNTER — Ambulatory Visit: Payer: Medicaid Other | Admitting: Obstetrics & Gynecology

## 2013-01-16 ENCOUNTER — Ambulatory Visit: Payer: Medicaid Other | Admitting: Obstetrics and Gynecology

## 2013-01-18 ENCOUNTER — Encounter: Payer: Self-pay | Admitting: Obstetrics and Gynecology

## 2013-01-18 ENCOUNTER — Ambulatory Visit (INDEPENDENT_AMBULATORY_CARE_PROVIDER_SITE_OTHER): Payer: Medicaid Other | Admitting: Obstetrics and Gynecology

## 2013-01-18 VITALS — BP 116/81 | HR 87 | Ht 64.5 in | Wt 236.4 lb

## 2013-01-18 DIAGNOSIS — N949 Unspecified condition associated with female genital organs and menstrual cycle: Secondary | ICD-10-CM

## 2013-01-18 DIAGNOSIS — N938 Other specified abnormal uterine and vaginal bleeding: Secondary | ICD-10-CM

## 2013-01-18 MED ORDER — MEDROXYPROGESTERONE ACETATE 150 MG/ML IM SUSP
150.0000 mg | Freq: Once | INTRAMUSCULAR | Status: AC
  Administered 2013-01-18: 150 mg via INTRAMUSCULAR

## 2013-01-18 NOTE — Progress Notes (Signed)
Patient ID: Rebecca Flynn, female   DOB: [birth year], 32 y.o.   MRN: [id number] 32 yo G2P2002 here for F/U after being changed from Provera to Megace for DUB. At last visit here 12/07/12 with Dr. Macon Large  Results of pelvic US of 11/29/12 reviewed with pt and she was offered medical and surgical options for the DUB. The Megace did slow and then stop her bleeding. LMP 01/11/13 has not been heavy. Still declines IUD, Depo or surgical options and elects medical management if DUB persists.   Filed Vitals:   01/18/13 1304  BP: 116/81  Pulse: 87   Gen: WN, WD in NAD Exam deferred.   A/P Functional DUB without anemia> Keep bleeding calendar Irregular menses> counseled on  Contraceptive options reviewed.  F/U 3 months or prn.

## 2013-01-18 NOTE — Patient Instructions (Addendum)

## 2013-04-05 ENCOUNTER — Ambulatory Visit: Payer: Medicaid Other

## 2013-04-07 ENCOUNTER — Encounter (HOSPITAL_COMMUNITY): Payer: Self-pay | Admitting: Cardiology

## 2013-04-07 ENCOUNTER — Emergency Department (HOSPITAL_COMMUNITY)
Admission: EM | Admit: 2013-04-07 | Discharge: 2013-04-07 | Disposition: A | Discharge status: Home / Self Care | Payer: Self-pay | Attending: Emergency Medicine | Admitting: Emergency Medicine

## 2013-04-07 DIAGNOSIS — Z8709 Personal history of other diseases of the respiratory system: Secondary | ICD-10-CM | POA: Insufficient documentation

## 2013-04-07 DIAGNOSIS — Z87448 Personal history of other diseases of urinary system: Secondary | ICD-10-CM | POA: Insufficient documentation

## 2013-04-07 DIAGNOSIS — R230 Cyanosis: Secondary | ICD-10-CM | POA: Insufficient documentation

## 2013-04-07 DIAGNOSIS — Z0000 Encounter for general adult medical examination without abnormal findings: Secondary | ICD-10-CM | POA: Insufficient documentation

## 2013-04-07 DIAGNOSIS — Z880 Allergy status to penicillin: Secondary | ICD-10-CM | POA: Insufficient documentation

## 2013-04-07 DIAGNOSIS — Z79899 Other long term (current) drug therapy: Secondary | ICD-10-CM | POA: Insufficient documentation

## 2013-04-07 DIAGNOSIS — Z87440 Personal history of urinary (tract) infections: Secondary | ICD-10-CM | POA: Insufficient documentation

## 2013-04-07 DIAGNOSIS — J45901 Unspecified asthma with (acute) exacerbation: Secondary | ICD-10-CM | POA: Insufficient documentation

## 2013-04-07 DIAGNOSIS — F17200 Nicotine dependence, unspecified, uncomplicated: Secondary | ICD-10-CM | POA: Insufficient documentation

## 2013-04-07 DIAGNOSIS — Z8619 Personal history of other infectious and parasitic diseases: Secondary | ICD-10-CM | POA: Insufficient documentation

## 2013-04-07 DIAGNOSIS — Z8742 Personal history of other diseases of the female genital tract: Secondary | ICD-10-CM | POA: Insufficient documentation

## 2013-04-07 MED ORDER — ALBUTEROL SULFATE HFA 108 (90 BASE) MCG/ACT IN AERS: 1.0000 | INHALATION_SPRAY | RESPIRATORY_TRACT | Status: DC | PRN

## 2013-04-07 NOTE — ED Provider Notes (Signed)
History    This chart was scribed for a non-physician practitioner, Wynetta Emery, working with Hurman Horn, MD by Frederik Pear, ED Scribe. This patient was seen in room TR07C/TR07C and the patient's care was started at 1547.   CSN: [id number]  Arrival date & time 04/07/13  1459   First MD Initiated Contact with Patient 04/07/13 1547      Chief Complaint  Patient presents with  . Numbness    (Consider location/radiation/quality/duration/timing/severity/associated sxs/prior treatment) The history is provided by the patient and medical records. No language interpreter was used.    HPI Comments: Rebecca Flynn is a 32 y.o. female with a h/o of chronic bronchitis who presents to the Emergency Department complaining of sudden onset, rapidly improving cyanosis to her left arm that began at 0900 while at work. She also complains of sudden onset SOB that also began this morning, but has since resolved. In triage, the nurse was able to improve the blue color of her left arm by wiping the area with an alcohol wipe. She denies leg and extremity pain as well as any other symptoms. 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 She has a h/o of vaginal bleeding for which she takes megestrol. Patient states this is well-controlled and she denies chest pain, shortness of breath. She states that her symptoms may be related to anxiety. She is a current, every day smoker.  PCP is Dr. Billee Cashing.  Past Medical History  Diagnosis Date  . Bronchitis   . Herpes genitalis   . Asthma   . Hydrosalpinx   . Urinary tract infection   . Pyelonephritis   . PID (pelvic inflammatory disease)   . Chlamydia   . Trichomonas     Past Surgical History  Procedure Laterality Date  . Cesarean section    . Wisdom tooth extraction    . Ankle surgery      Family History  Problem Relation Age of Onset  . Anesthesia problems Neg Hx   . Other Neg Hx     History  Substance Use Topics  . Smoking status:  Current Every Day Smoker -- 0.50 packs/day for 17 years    Types: Cigarettes  . Smokeless tobacco: Never Used  . Alcohol Use: Yes     Comment: occ    OB History   Grav Para Term Preterm Abortions TAB SAB Ect Mult Living   2 2 2       2       Review of Systems  Constitutional: Negative for fever.  Respiratory: Positive for shortness of breath.   Cardiovascular: Negative for chest pain.       Cyanosis  Gastrointestinal: Negative for nausea, vomiting, abdominal pain and diarrhea.  All other systems reviewed and are negative.    Allergies  Penicillins  Home Medications   Current Outpatient Rx  Name  Route  Sig  Dispense  Refill  . Aspirin-Salicylamide-Caffeine (BC HEADACHE POWDER PO)   Oral   Take 1 packet by mouth every 8 (eight) hours as needed (for headache).          . megestrol (MEGACE) 40 MG tablet   Oral   Take 40 mg by mouth daily.           BP 140/95  Pulse 99  Temp(Src) 98.4 F (36.9 C) (Oral)  Resp 20  SpO2 99%  Physical Exam  Nursing note and vitals reviewed. Constitutional: She is oriented to person, place, and time. She appears  well-developed and well-nourished. No distress.  HENT:  Head: Normocephalic.  Eyes: Conjunctivae and EOM are normal. Pupils are equal, round, and reactive to light.  Neck: Normal range of motion.  Cardiovascular: Normal rate, regular rhythm and intact distal pulses.  Exam reveals friction rub. Exam reveals no gallop.   No murmur heard. Pulses:      Radial pulses are 2+ on the right side, and 2+ on the left side.  Pulmonary/Chest: Effort normal and breath sounds normal. No stridor. No respiratory distress. She has no wheezes. She has no rales. She exhibits no tenderness.  Abdominal: Soft. Bowel sounds are normal. She exhibits no distension and no mass. There is no tenderness. There is no rebound and no guarding.  Musculoskeletal: Normal range of motion.  No calf asymmetry, superficial collaterals, palpable cords, edema,  Homans sign negative bilaterally.    Neurological: She is alert and oriented to person, place, and time.  Skin: Skin is warm.  Cap refill less than 3 seconds  Psychiatric: She has a normal mood and affect.    ED Course  Procedures (including critical care time)  DIAGNOSTIC STUDIES: Oxygen Saturation is 99% on room air, normal by my interpretation.    COORDINATION OF CARE:  16:42- Discussed planned course of treatment with the patient, including quitting smoking, who is agreeable at this time.  Labs Reviewed - No data to display No results found.   1. Physical exam, routine       MDM   Filed Vitals:   04/07/13 1504 04/07/13 1700  BP: 140/95 128/92  Pulse: 99 70  Temp: 98.4 F (36.9 C)   TempSrc: Oral   Resp: 20 18  SpO2: 99% 98%     Rebecca Flynn is a 32 y.o. female with resolved cyanosis and shortness of breath onset this a.m. Physical exam and vitals show no abnormalities. Patient reports subjective improvement and is amenable to discharge at this time.  The patient is hemodynamically stable, appropriate for, and amenable to, discharge at this time. Pt verbalized understanding and agrees with care plan. Outpatient follow-up and return precautions given.    Discharge Medication List as of 04/07/2013  4:50 PM    START taking these medications   Details  albuterol (PROVENTIL HFA;VENTOLIN HFA) 108 (90 BASE) MCG/ACT inhaler Inhale 1-2 puffs into the lungs every 4 (four) hours as needed for wheezing., Starting 04/07/2013, Until Discontinued, Print        I personally performed the services described in this documentation, which was scribed in my presence. The recorded information has been reviewed and is accurate.         Wynetta Emery, PA-C 04/11/13 1023

## 2013-04-07 NOTE — ED Notes (Signed)
PA at bedside.

## 2013-04-07 NOTE — ED Notes (Signed)
Pt denies dizziness and blurry vision at this time.

## 2013-04-07 NOTE — ED Notes (Signed)
Pt reports that she woke up this morning and had some numbness in the left arm. Pt reports that her co-workers noticed that hands looked blue. Pt states that she just does not feel herself. Pt has a blue tint to her hands, but it came off with rubbing alcohol at triage. Pt reports that her work sent her here for evaluation. No distress noted at triage.

## 2013-04-13 NOTE — ED Provider Notes (Signed)
Medical screening examination/treatment/procedure(s) were performed by non-physician practitioner and as supervising physician I was immediately available for consultation/collaboration.  Hurman Horn, MD 04/13/13 [phone]

## 2013-05-11 ENCOUNTER — Encounter (HOSPITAL_COMMUNITY): Payer: Self-pay | Admitting: *Deleted

## 2013-05-11 ENCOUNTER — Inpatient Hospital Stay (HOSPITAL_COMMUNITY)
Admission: AD | Admit: 2013-05-11 | Discharge: 2013-05-11 | Disposition: A | Discharge status: Home / Self Care | Payer: Self-pay | Source: Ambulatory Visit | Attending: Obstetrics & Gynecology | Admitting: Obstetrics & Gynecology

## 2013-05-11 DIAGNOSIS — N949 Unspecified condition associated with female genital organs and menstrual cycle: Secondary | ICD-10-CM

## 2013-05-11 DIAGNOSIS — N938 Other specified abnormal uterine and vaginal bleeding: Secondary | ICD-10-CM

## 2013-05-11 DIAGNOSIS — R1032 Left lower quadrant pain: Secondary | ICD-10-CM | POA: Insufficient documentation

## 2013-05-11 LAB — CBC
HCT: 43.8 % (ref 36.0–46.0)
MCV: 87.4 fL (ref 78.0–100.0)
RBC: 5.01 MIL/uL (ref 3.87–5.11)
RDW: 14 % (ref 11.5–15.5)
WBC: 15 10*3/uL — ABNORMAL HIGH (ref 4.0–10.5)

## 2013-05-11 LAB — WET PREP, GENITAL: Clue Cells Wet Prep HPF POC: NONE SEEN

## 2013-05-11 MED ORDER — IBUPROFEN 800 MG PO TABS
800.0000 mg | ORAL_TABLET | Freq: Once | ORAL | Status: AC
  Administered 2013-05-11: 800 mg via ORAL
  Filled 2013-05-11: qty 1

## 2013-05-11 MED ORDER — MEGESTROL ACETATE 40 MG PO TABS
40.0000 mg | ORAL_TABLET | Freq: Once | ORAL | Status: AC
  Administered 2013-05-11: 40 mg via ORAL
  Filled 2013-05-11: qty 1

## 2013-05-11 MED ORDER — KETOROLAC TROMETHAMINE 60 MG/2ML IM SOLN
60.0000 mg | Freq: Once | INTRAMUSCULAR | Status: DC
  Filled 2013-05-11: qty 2

## 2013-05-11 MED ORDER — MEGESTROL ACETATE 40 MG PO TABS: 40.0000 mg | ORAL_TABLET | Freq: Every day | ORAL | Status: DC

## 2013-05-11 NOTE — MAU Note (Addendum)
Cramping in LLQ.  Is bleeding heavy, had not been bleeding while on meds, ran out of meds and was unable to get shot.  Started bleeding 2-3 wks ago.  Been nauseated the past couple days.

## 2013-05-11 NOTE — MAU Provider Note (Signed)
History     CSN: [id number]  Arrival date and time: 05/11/13 7829   First Provider Initiated Contact with Patient 05/11/13 1919      Chief Complaint  Patient presents with  . Vaginal Bleeding  . Abdominal Pain   HPI  Pt is not pregnant and presents with LLQ pain and bleeding heavy with clots. Pt is changing a pad 2 to 3 times a day with small clots,. Pt has been seen in MAU and GYN clinic for DUB and U/S perfomed.Pt has most recenlty been on Depo Provera 150mg  due June 22 and also previously on Megace 40mg  daily which controlled her bleeding.  Pt is tearful and is tired of bleeding.  Pt had stopped bleeding but lost prescription for Megace and started bleeding 2 - 3 weeks ago.  Pt's insurance has lapsed and pt has gotten full time employment to get insurance shortly.  Pt is worried about her future fertility.    Past Medical History  Diagnosis Date  . Bronchitis   . Herpes genitalis   . Asthma   . Hydrosalpinx   . Urinary tract infection   . Pyelonephritis   . PID (pelvic inflammatory disease)   . Chlamydia   . Trichomonas     Past Surgical History  Procedure Laterality Date  . Cesarean section    . Wisdom tooth extraction    . Ankle surgery      Family History  Problem Relation Age of Onset  . Anesthesia problems Neg Hx   . Other Neg Hx     History  Substance Use Topics  . Smoking status: Current Every Day Smoker -- 0.50 packs/day for 17 years    Types: Cigarettes  . Smokeless tobacco: Never Used  . Alcohol Use: Yes     Comment: occ    Allergies:  Allergies  Allergen Reactions  . Penicillins Nausea And Vomiting and Rash    Prescriptions prior to admission  Medication Sig Dispense Refill  . albuterol (PROVENTIL HFA;VENTOLIN HFA) 108 (90 BASE) MCG/ACT inhaler Inhale 1-2 puffs into the lungs every 4 (four) hours as needed for wheezing.  1 Inhaler  3  . Aspirin-Salicylamide-Caffeine (BC HEADACHE POWDER PO) Take 1 packet by mouth every 8 (eight) hours as  needed (for headache).       . megestrol (MEGACE) 40 MG tablet Take 40 mg by mouth daily.      . naproxen sodium (ALEVE) 220 MG tablet Take 220 mg by mouth daily as needed (For pain.).        Review of Systems  Constitutional: Negative for fever and chills.  Gastrointestinal: Positive for nausea, abdominal pain and constipation. Negative for vomiting and diarrhea.  Genitourinary: Negative for dysuria.   Physical Exam   Blood pressure 130/87, pulse 72, temperature 98.6 F (37 C), resp. rate 20, height 5\' 5"  (1.651 m), weight 109.77 kg (242 lb).  Physical Exam  Nursing note and vitals reviewed. Constitutional: She is oriented to person, place, and time. She appears well-developed and well-nourished.  tearful  HENT:  Head: Normocephalic.  Eyes: Pupils are equal, round, and reactive to light.  Neck: Normal range of motion. Neck supple.  Cardiovascular: Normal rate.   Respiratory: Effort normal.  GI: Soft. She exhibits no distension. There is no tenderness. There is no rebound and no guarding.  Mild left mid quadrant pain with palpation; no rebound  Genitourinary:  Scant amount of blood from os (pt just took out tampon) cervix clean, nontender; uterus NSSC NT;  adnexa without palpable enlargement or tenderness ?left ovarian tenderness  Musculoskeletal: Normal range of motion.  Neurological: She is alert and oriented to person, place, and time.  Skin: Skin is warm and dry.  Psychiatric: She has a normal mood and affect.    MAU Course  Procedures Results for orders placed during the hospital encounter of 05/11/13 (from the past 24 hour(s))  CBC     Status: Abnormal   Collection Time    05/11/13  7:40 PM      Result Value Range   WBC 15.0 (*) 4.0 - 10.5 K/uL   RBC 5.01  3.87 - 5.11 MIL/uL   Hemoglobin 14.7  12.0 - 15.0 g/dL   HCT 11.9  14.7 - 82.9 %   MCV 87.4  78.0 - 100.0 fL   MCH 29.3  26.0 - 34.0 pg   MCHC 33.6  30.0 - 36.0 g/dL   RDW 56.2  13.0 - 86.5 %   Platelets 384   150 - 400 K/uL  WET PREP, GENITAL     Status: Abnormal   Collection Time    05/11/13  7:40 PM      Result Value Range   Yeast Wet Prep HPF POC NONE SEEN  NONE SEEN   Trich, Wet Prep NONE SEEN  NONE SEEN   Clue Cells Wet Prep HPF POC NONE SEEN  NONE SEEN   WBC, Wet Prep HPF POC FEW (*) NONE SEEN      Assessment and Plan  DUB- Megace 40mg  qd until clinic appointment Ibuprofen 800mg  q 8 hours F/u in clinic  [facility] 05/11/2013, 8:19 PM

## 2013-05-12 LAB — GC/CHLAMYDIA PROBE AMP: GC Probe RNA: NEGATIVE

## 2013-09-19 ENCOUNTER — Telehealth: Payer: Self-pay

## 2013-09-19 ENCOUNTER — Other Ambulatory Visit: Payer: Self-pay | Admitting: Obstetrics and Gynecology

## 2013-09-19 MED ORDER — MEGESTROL ACETATE 40 MG PO TABS: 40.0000 mg | ORAL_TABLET | Freq: Every day | ORAL | Status: DC

## 2013-09-19 NOTE — Telephone Encounter (Signed)
Pt. Called and left message stating she is waiting on her medicaid to be re-certified but she needs a refill on Megestrol.

## 2013-09-19 NOTE — Telephone Encounter (Signed)
Talked to MD Constant who agreed to refill pt.'s prescription of Megestrol. Called pt. To inform her that prescription is ready for pick up at her pharmacy. Pt. Verbalized understanding and gratitude and stated she had no further questions or issues. Pt. States she will make an appointment to get on birth control when her medicaid goes through.

## 2014-01-05 ENCOUNTER — Ambulatory Visit (INDEPENDENT_AMBULATORY_CARE_PROVIDER_SITE_OTHER): Payer: Commercial Managed Care - PPO | Admitting: Nurse Practitioner

## 2014-01-05 ENCOUNTER — Encounter: Payer: Self-pay | Admitting: Nurse Practitioner

## 2014-01-05 VITALS — BP 110/60 | Temp 98.3°F | Ht 64.5 in | Wt 244.7 lb

## 2014-01-05 DIAGNOSIS — F17200 Nicotine dependence, unspecified, uncomplicated: Secondary | ICD-10-CM

## 2014-01-05 DIAGNOSIS — N938 Other specified abnormal uterine and vaginal bleeding: Secondary | ICD-10-CM

## 2014-01-05 DIAGNOSIS — E669 Obesity, unspecified: Secondary | ICD-10-CM

## 2014-01-05 DIAGNOSIS — Z6838 Body mass index (BMI) 38.0-38.9, adult: Secondary | ICD-10-CM | POA: Insufficient documentation

## 2014-01-05 DIAGNOSIS — IMO0002 Reserved for concepts with insufficient information to code with codable children: Secondary | ICD-10-CM | POA: Insufficient documentation

## 2014-01-05 DIAGNOSIS — N949 Unspecified condition associated with female genital organs and menstrual cycle: Secondary | ICD-10-CM

## 2014-01-05 DIAGNOSIS — Z01419 Encounter for gynecological examination (general) (routine) without abnormal findings: Secondary | ICD-10-CM

## 2014-01-05 DIAGNOSIS — N979 Female infertility, unspecified: Secondary | ICD-10-CM

## 2014-01-05 NOTE — Addendum Note (Signed)
Addended by: Louanna RawAMPBELL, Veldon Wager M on: 01/05/2014 12:00 PM   Modules accepted: Orders

## 2014-01-05 NOTE — Progress Notes (Signed)
History:  Rebecca ChurnCindy R Flynn is a 33 y.o. G2P2002 who [facility] pap smear. She has history of DUB and had been put on Megesterol which she takes monthly. She desires pregnancy asap and would like to not take megace. She has noticed a 20 lb weight gain. She is working on smoking cessation and has a stable partner.   The following portions of the patient's history were reviewed and updated as appropriate: allergies, current medications, past family history, past medical history, past social history, past surgical history and problem list.  Review of Systems:  Pertinent items are noted in HPI.  Objective:  Physical Exam BP 110/60  Temp(Src) 98.3 F (36.8 C)  Ht 5' 4.5" (1.638 m)  Wt 244 lb 11.2 oz (110.995 kg)  BMI 41.37 kg/m2  LMP 12/05/2013 GENERAL: Well-developed, well-nourished female in no acute distress. Many tattoos. HEENT: Normocephalic, atraumatic.  BREASTS: Symmetric in size. No masses, skin changes, nipple drainage, or lymphadenopathy. ABDOMEN: Soft, nontender, nondistended. No organomegaly. Normal bowel sounds appreciated in all quadrants. Obese PELVIC: Normal external female genitalia. Vagina is pink and rugated.  Normal discharge. Normal cervix contour. Pap smear obtained. Uterus is normal in size. No adnexal mass or tenderness. Difficult to examine due to size EXTREMITIES: No cyanosis, clubbing, or edema, 2+ distal pulses.   Labs and Imaging No results found.  Assessment & Plan:  Assessment:  DUB with desire for fertility Obesity Smoker  Plans:  Refer to Dr April MansonYalcinkaya for fertility work up Advised to stop smoking/ lose weight Start PNV  Delbert PhenixLinda M Lam Bjorklund, NP 01/05/2014 8:43 AM

## 2014-01-05 NOTE — Patient Instructions (Signed)

## 2014-02-15 ENCOUNTER — Emergency Department (INDEPENDENT_AMBULATORY_CARE_PROVIDER_SITE_OTHER)
Admission: EM | Admit: 2014-02-15 | Discharge: 2014-02-15 | Disposition: A | Discharge status: Home / Self Care | Payer: 59 | Source: Home / Self Care | Attending: Family Medicine | Admitting: Family Medicine

## 2014-02-15 ENCOUNTER — Encounter (HOSPITAL_COMMUNITY): Payer: Self-pay | Admitting: Emergency Medicine

## 2014-02-15 DIAGNOSIS — L0291 Cutaneous abscess, unspecified: Secondary | ICD-10-CM

## 2014-02-15 DIAGNOSIS — L0390 Cellulitis, unspecified: Secondary | ICD-10-CM

## 2014-02-15 MED ORDER — DOXYCYCLINE HYCLATE 100 MG PO CAPS: 100.0000 mg | ORAL_CAPSULE | Freq: Two times a day (BID) | ORAL | Status: DC

## 2014-02-15 MED ORDER — MUPIROCIN 2 % EX OINT: 1.0000 "application " | TOPICAL_OINTMENT | Freq: Two times a day (BID) | CUTANEOUS | Status: DC

## 2014-02-15 NOTE — ED Notes (Signed)
Reports possible insect bite to right lower forearm.  On set Friday.  Site drained on it's on.  Redness.  Denies pain.   No otc meds used.

## 2014-02-15 NOTE — ED Provider Notes (Signed)
Rebecca Flynn is a 33 y.o. female who presents to Urgent Care today for right forearm boil. Patient developed pain induration swelling and a "boil" on her right forearm over the past 6 days. It drained spontaneously. She's tried warm compress which seems to help. She denies any significant severe pain. She denies any fevers or chills nausea vomiting or diarrhea. She denies any arthropod bites. No other skin changes. No nausea vomiting or diarrhea.   Past Medical History  Diagnosis Date  . Bronchitis   . Herpes genitalis   . Asthma   . Hydrosalpinx   . Urinary tract infection   . Pyelonephritis   . PID (pelvic inflammatory disease)   . Chlamydia   . Trichomonas   . DUB (dysfunctional uterine bleeding)    History  Substance Use Topics  . Smoking status: Current Every Day Smoker -- 0.50 packs/day for 17 years    Types: Cigarettes  . Smokeless tobacco: Never Used  . Alcohol Use: No     Comment: occ   ROS as above Medications: No current facility-administered medications for this encounter.   Current Outpatient Prescriptions  Medication Sig Dispense Refill  . megestrol (MEGACE) 40 MG tablet Take 1 tablet (40 mg total) by mouth daily.  90 tablet  2  . albuterol (PROVENTIL HFA;VENTOLIN HFA) 108 (90 BASE) MCG/ACT inhaler Inhale 1-2 puffs into the lungs every 4 (four) hours as needed for wheezing.  1 Inhaler  3  . Aspirin-Salicylamide-Caffeine (BC HEADACHE POWDER PO) Take 1 packet by mouth every 8 (eight) hours as needed (for headache).       . doxycycline (VIBRAMYCIN) 100 MG capsule Take 1 capsule (100 mg total) by mouth 2 (two) times daily.  20 capsule  0  . mupirocin ointment (BACTROBAN) 2 % Apply 1 application topically 2 (two) times daily.  30 g  1  . naproxen sodium (ALEVE) 220 MG tablet Take 220 mg by mouth daily as needed (For pain.).        Exam:  BP 111/74  Pulse 77  Temp(Src) 98 F (36.7 C) (Oral)  Resp 16  SpO2 100% Gen: Well NAD Skin: Around 1 cm in diameter  ulceration with surrounding 3 cm in diameter erythema with mild induration. Mildly tender to touch. No extending erythema.  No results found for this or any previous visit (from the past 24 hour(s)). No results found.  Assessment and Plan: 33 y.o. female with ulceration or abscess. Plan to treat with doxycycline and mupirocin antibiotic ointment. Followup if not improving rapidly.  Discussed warning signs or symptoms. Please see discharge instructions. Patient expresses understanding.    Rodolph BongEvan S Audreyana Huntsberry, MD 02/15/14 [phone]

## 2014-02-15 NOTE — Discharge Instructions (Signed)
Thank you for coming in today. Taking doxycycline twice daily for 10 days. Use Bactroban ointment twice daily. Return if not getting better. Go to the emergency room if getting significantly worse  Abscess An abscess is an infected area that contains a collection of pus and debris.It can occur in almost any part of the body. An abscess is also known as a furuncle or boil. CAUSES  An abscess occurs when tissue gets infected. This can occur from blockage of oil or sweat glands, infection of hair follicles, or a minor injury to the skin. As the body tries to fight the infection, pus collects in the area and creates pressure under the skin. This pressure causes pain. People with weakened immune systems have difficulty fighting infections and get certain abscesses more often.  SYMPTOMS Usually an abscess develops on the skin and becomes a painful mass that is red, warm, and tender. If the abscess forms under the skin, you may feel a moveable soft area under the skin. Some abscesses break open (rupture) on their own, but most will continue to get worse without care. The infection can spread deeper into the body and eventually into the bloodstream, causing you to feel ill.  DIAGNOSIS  Your caregiver will take your medical history and perform a physical exam. A sample of fluid may also be taken from the abscess to determine what is causing your infection. TREATMENT  Your caregiver may prescribe antibiotic medicines to fight the infection. However, taking antibiotics alone usually does not cure an abscess. Your caregiver may need to make a small cut (incision) in the abscess to drain the pus. In some cases, gauze is packed into the abscess to reduce pain and to continue draining the area. HOME CARE INSTRUCTIONS   Only take over-the-counter or prescription medicines for pain, discomfort, or fever as directed by your caregiver.  If you were prescribed antibiotics, take them as directed. Finish them even if  you start to feel better.  If gauze is used, follow your caregiver's directions for changing the gauze.  To avoid spreading the infection:  Keep your draining abscess covered with a bandage.  Wash your hands well.  Do not share personal care items, towels, or whirlpools with others.  Avoid skin contact with others.  Keep your skin and clothes clean around the abscess.  Keep all follow-up appointments as directed by your caregiver. SEEK MEDICAL CARE IF:   You have increased pain, swelling, redness, fluid drainage, or bleeding.  You have muscle aches, chills, or a general ill feeling.  You have a fever. MAKE SURE YOU:   Understand these instructions.  Will watch your condition.  Will get help right away if you are not doing well or get worse. Document Released: 08/12/2005 Document Revised: 05/03/2012 Document Reviewed: 01/15/2012 [facility] Patient Information 2014 [employer] [place].

## 2014-04-30 ENCOUNTER — Telehealth: Payer: Self-pay

## 2014-04-30 DIAGNOSIS — N938 Other specified abnormal uterine and vaginal bleeding: Secondary | ICD-10-CM

## 2014-04-30 NOTE — Telephone Encounter (Signed)
Called patient, no answer- left message that we are trying to return your phone call, please call us back at the clinics 

## 2014-04-30 NOTE — Telephone Encounter (Signed)
Patient called requesting refill for Megace.

## 2014-05-01 MED ORDER — MEGESTROL ACETATE 40 MG PO TABS: 40.0000 mg | ORAL_TABLET | Freq: Every day | ORAL | Status: DC

## 2014-05-01 NOTE — Telephone Encounter (Signed)
Called patient, no answer- left message that we are trying to return your phone call, if you still need assistance please call us back at the clinics

## 2014-05-01 NOTE — Telephone Encounter (Addendum)
6/16  1337  Pt called again at 1227 and left message that she is requesting a refill of Megace 40 mg.  She would like it to be sent to [facility] on [address].  She stated that a message can be left on her phone as she will not get off work until [id number]. I called and spoke with pharmacist @ [facility] to review refill history. She stated that even though the Rx was written for #90 tabs, pt has preferred to pick up only #30 tabs at a time. The last pick up for # 30 tabs was on 6/6 and this was the 9th time of obtaining #30 tabs. She has no additional refill available. Message sent to Dr. Macon LargeAnyanwu for review of pt request.  6/16  1608  Called pt and left message on her personal voice mail that her medication refill request has been authorized and sent to her pharmacy. If she has additional questions or problems she may call back.

## 2014-09-17 ENCOUNTER — Encounter (HOSPITAL_COMMUNITY): Payer: Self-pay | Admitting: Emergency Medicine

## 2014-12-21 ENCOUNTER — Ambulatory Visit (INDEPENDENT_AMBULATORY_CARE_PROVIDER_SITE_OTHER): Payer: 59 | Admitting: Family Medicine

## 2014-12-21 VITALS — BP 118/64 | HR 84 | Temp 98.4°F | Resp 18 | Ht 65.25 in | Wt 229.2 lb

## 2014-12-21 DIAGNOSIS — Z719 Counseling, unspecified: Secondary | ICD-10-CM

## 2014-12-21 DIAGNOSIS — Z7189 Other specified counseling: Secondary | ICD-10-CM

## 2014-12-21 DIAGNOSIS — Z202 Contact with and (suspected) exposure to infections with a predominantly sexual mode of transmission: Secondary | ICD-10-CM

## 2014-12-21 MED ORDER — AZITHROMYCIN 250 MG PO TABS: 1000.0000 mg | ORAL_TABLET | Freq: Once | ORAL | Status: DC

## 2014-12-21 NOTE — Progress Notes (Signed)
Subjective:    Patient ID: CARINE NORDGREN, female    DOB: 1980/12/18, 34 y.o.   MRN: [id number]  This chart was scribed for Shade Flood, MD by Ronney Lion, ED Scribe. This patient was seen in room 5 and the patient's care was started at 3:44 PM.   Chief Complaint  Patient presents with  . Other    Pt. wants medicine for chlamydia. Boyfriend tested positive recently. Wants to bypass testing.     HPI  HPI Comments: KASANDRA FEHR is a 34 y.o. female who presents to the [facility] because her boyfriend's partner tested positive for chlamydia just 1-2 hours ago. She reports she has been sexually active with him recently without protection. Patient last had an STI screening during her annual women's health checkup last year; it was negative. She has had herpes when she was younger, but she hasn't had any recurrences since. Patient reports taking Megacefor a condition involving vaginal bleeding.   Patient would like to delay STI screening until her annual pap smear in 2 weeks, due to financial reasons.   Patient denies a history of chlamydia or gonorrhea. She also denies abdominal pain, pelvic pain, or vaginal discharge.   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   Patient Active Problem List   Diagnosis Date Noted  . Obesity 01/05/2014  . Smoker 01/05/2014  . Infertility 01/05/2014  . DUB (dysfunctional uterine bleeding) 12/09/2012   Past Medical History  Diagnosis Date  . Bronchitis   . Herpes genitalis   . Asthma   . Hydrosalpinx   . Urinary tract infection   . Pyelonephritis   . PID (pelvic inflammatory disease)   . Chlamydia   . Trichomonas   . DUB (dysfunctional uterine bleeding)    Past Surgical History  Procedure Laterality Date  . Cesarean section    . Wisdom tooth extraction    . Ankle surgery     Allergies  Allergen Reactions  . Penicillins Nausea And Vomiting and Rash   Prior to Admission medications   Medication Sig  Start Date End Date Taking? Authorizing Provider  megestrol (MEGACE) 40 MG tablet Take 1 tablet (40 mg total) by mouth daily. 05/01/14  Yes Tereso Newcomer, MD  albuterol (PROVENTIL HFA;VENTOLIN HFA) 108 (90 BASE) MCG/ACT inhaler Inhale 1-2 puffs into the lungs every 4 (four) hours as needed for wheezing. Patient not taking: Reported on 12/21/2014 04/07/13   Joni Reining Pisciotta, PA-C  Aspirin-Salicylamide-Caffeine (BC HEADACHE POWDER PO) Take 1 packet by mouth every 8 (eight) hours as needed (for headache).     Historical Provider, MD  doxycycline (VIBRAMYCIN) 100 MG capsule Take 1 capsule (100 mg total) by mouth 2 (two) times daily. Patient not taking: Reported on 12/21/2014 02/15/14   Rodolph Bong, MD  mupirocin ointment (BACTROBAN) 2 % Apply 1 application topically 2 (two) times daily. Patient not taking: Reported on 12/21/2014 02/15/14   Rodolph Bong, MD  naproxen sodium (ALEVE) 220 MG tablet Take 220 mg by mouth daily as needed (For pain.).    Historical Provider, MD   History   Social History  . Marital Status: Single    Spouse Name: N/A    Number of Children: N/A  . Years of Education: N/A   Occupational History  . Not on file.   Social History Main Topics  . Smoking status: Former Smoker -- 0.50 packs/day for 17 years    Types: Cigarettes  . Smokeless  tobacco: Never Used  . Alcohol Use: No     Comment: occ  . Drug Use: No  . Sexual Activity: Yes    Birth Control/ Protection: Condom   Other Topics Concern  . Not on file   Social History Narrative      Review of Systems     Objective:   Physical Exam  Constitutional: She is oriented to person, place, and time. She appears well-developed and well-nourished. No distress.  HENT:  Head: Normocephalic and atraumatic.  Eyes: Conjunctivae and EOM are normal.  Neck: Neck supple. No tracheal deviation present.  Cardiovascular: Normal rate.   Pulmonary/Chest: Effort normal. No respiratory distress.  Musculoskeletal: Normal range of  motion.  Neurological: She is alert and oriented to person, place, and time.  Skin: Skin is warm and dry.  Psychiatric: She has a normal mood and affect. Her behavior is normal.  Nursing note and vitals reviewed.   Filed Vitals:   12/21/14 1507  BP: 118/64  Pulse: 84  Temp: 98.4 F (36.9 C)  TempSrc: Oral  Resp: 18  Height: 5' 5.25" (1.657 m)  Weight: 229 lb 3.2 oz (103.964 kg)  SpO2: 100%       Assessment & Plan:   Kelli ChurnCindy R Yerger is a 34 y.o. female Exposure to chlamydia - Plan: azithromycin (ZITHROMAX) 250 MG tablet  Health counseling   Exposure to chlamydia. Discussed testing, but declined today for cost reasons. Plans on having testing done at her OB/GYN in 2 weeks. Risks and side effects of azithromycin discussed and risks of not checking other STIs today discussed, with understanding expressed. Phone numbers given for counseling if needed. But plans on discontinuing current relationship.   Meds ordered this encounter  Medications  . azithromycin (ZITHROMAX) 250 MG tablet    Sig: Take 4 tablets (1,000 mg total) by mouth once.    Dispense:  4 tablet    Refill:  0   Patient Instructions  Take all 4 azithromycin once today.  If vomiting within 2 hours, will need to repeat dose.  [facility] but if any new discharge, abdominal or pelvic pain - return for other testing/evaluation.  If counseling numbers needed: Karmen Bongoaron Stewart: [phone] [place] PedClaire Huprich: [phone]

## 2014-12-21 NOTE — Patient Instructions (Addendum)
Take all 4 azithromycin once today.  If vomiting within 2 hours, will need to repeat dose.  [facility], but if any new discharge, abdominal or pelvic pain - return for other testing/evaluation.  If counseling numbers needed: Karmen Bongoaron Stewart: [phone] [place] PedClaire Huprich: [phone]

## 2015-02-04 ENCOUNTER — Ambulatory Visit (HOSPITAL_COMMUNITY): Payer: Self-pay | Admitting: Psychiatry

## 2015-04-08 ENCOUNTER — Encounter (HOSPITAL_COMMUNITY): Payer: Self-pay | Admitting: Emergency Medicine

## 2015-04-08 ENCOUNTER — Emergency Department (HOSPITAL_COMMUNITY)
Admission: EM | Admit: 2015-04-08 | Discharge: 2015-04-08 | Disposition: A | Discharge status: Home / Self Care | Payer: Self-pay | Attending: Emergency Medicine | Admitting: Emergency Medicine

## 2015-04-08 DIAGNOSIS — R319 Hematuria, unspecified: Secondary | ICD-10-CM

## 2015-04-08 DIAGNOSIS — J45909 Unspecified asthma, uncomplicated: Secondary | ICD-10-CM | POA: Insufficient documentation

## 2015-04-08 DIAGNOSIS — Z87891 Personal history of nicotine dependence: Secondary | ICD-10-CM | POA: Insufficient documentation

## 2015-04-08 DIAGNOSIS — Z880 Allergy status to penicillin: Secondary | ICD-10-CM | POA: Insufficient documentation

## 2015-04-08 DIAGNOSIS — N390 Urinary tract infection, site not specified: Secondary | ICD-10-CM | POA: Insufficient documentation

## 2015-04-08 DIAGNOSIS — Z87448 Personal history of other diseases of urinary system: Secondary | ICD-10-CM | POA: Insufficient documentation

## 2015-04-08 DIAGNOSIS — Z8619 Personal history of other infectious and parasitic diseases: Secondary | ICD-10-CM | POA: Insufficient documentation

## 2015-04-08 DIAGNOSIS — B9689 Other specified bacterial agents as the cause of diseases classified elsewhere: Secondary | ICD-10-CM

## 2015-04-08 DIAGNOSIS — Z3202 Encounter for pregnancy test, result negative: Secondary | ICD-10-CM | POA: Insufficient documentation

## 2015-04-08 DIAGNOSIS — Z79899 Other long term (current) drug therapy: Secondary | ICD-10-CM | POA: Insufficient documentation

## 2015-04-08 DIAGNOSIS — R1110 Vomiting, unspecified: Secondary | ICD-10-CM | POA: Insufficient documentation

## 2015-04-08 DIAGNOSIS — N760 Acute vaginitis: Secondary | ICD-10-CM | POA: Insufficient documentation

## 2015-04-08 LAB — CBC WITH DIFFERENTIAL/PLATELET
BASOS ABS: 0 10*3/uL (ref 0.0–0.1)
BASOS PCT: 0 % (ref 0–1)
Eosinophils Absolute: 0.3 10*3/uL (ref 0.0–0.7)
Eosinophils Relative: 2 % (ref 0–5)
HCT: 42.5 % (ref 36.0–46.0)
Hemoglobin: 14.3 g/dL (ref 12.0–15.0)
LYMPHS ABS: 4.2 10*3/uL — AB (ref 0.7–4.0)
LYMPHS PCT: 28 % (ref 12–46)
MCH: 29.4 pg (ref 26.0–34.0)
MCHC: 33.6 g/dL (ref 30.0–36.0)
MCV: 87.4 fL (ref 78.0–100.0)
Monocytes Absolute: 1.2 10*3/uL — ABNORMAL HIGH (ref 0.1–1.0)
Monocytes Relative: 8 % (ref 3–12)
NEUTROS PCT: 62 % (ref 43–77)
Neutro Abs: 9.3 10*3/uL — ABNORMAL HIGH (ref 1.7–7.7)
PLATELETS: 408 10*3/uL — AB (ref 150–400)
RBC: 4.86 MIL/uL (ref 3.87–5.11)
RDW: 13.2 % (ref 11.5–15.5)
WBC: 14.9 10*3/uL — AB (ref 4.0–10.5)

## 2015-04-08 LAB — GC/CHLAMYDIA PROBE AMP (~~LOC~~) NOT AT ARMC
CHLAMYDIA, DNA PROBE: NEGATIVE
Neisseria Gonorrhea: NEGATIVE

## 2015-04-08 LAB — COMPREHENSIVE METABOLIC PANEL
ALT: 25 U/L (ref 14–54)
AST: 25 U/L (ref 15–41)
Albumin: 3.8 g/dL (ref 3.5–5.0)
Alkaline Phosphatase: 108 U/L (ref 38–126)
Anion gap: 10 (ref 5–15)
BUN: 12 mg/dL (ref 6–20)
CALCIUM: 9.2 mg/dL (ref 8.9–10.3)
CO2: 24 mmol/L (ref 22–32)
CREATININE: 0.94 mg/dL (ref 0.44–1.00)
Chloride: 102 mmol/L (ref 101–111)
GFR calc Af Amer: >60 mL/min (ref >60)
GFR calc non Af Amer: >60 mL/min (ref >60)
Glucose, Bld: 107 mg/dL — ABNORMAL HIGH (ref 65–99)
POTASSIUM: 3.9 mmol/L (ref 3.5–5.1)
SODIUM: 136 mmol/L (ref 135–145)
Total Bilirubin: 0.3 mg/dL (ref 0.3–1.2)
Total Protein: 7.5 g/dL (ref 6.5–8.1)

## 2015-04-08 LAB — URINE MICROSCOPIC-ADD ON

## 2015-04-08 LAB — URINALYSIS, ROUTINE W REFLEX MICROSCOPIC
Bilirubin Urine: NEGATIVE
GLUCOSE, UA: NEGATIVE mg/dL
KETONES UR: NEGATIVE mg/dL
LEUKOCYTES UA: NEGATIVE
NITRITE: NEGATIVE
PROTEIN: NEGATIVE mg/dL
SPECIFIC GRAVITY, URINE: 1.024 (ref 1.005–1.030)
UROBILINOGEN UA: 0.2 mg/dL (ref 0.0–1.0)
pH: 7 (ref 5.0–8.0)

## 2015-04-08 LAB — WET PREP, GENITAL
Trich, Wet Prep: NONE SEEN
Yeast Wet Prep HPF POC: NONE SEEN

## 2015-04-08 LAB — POC URINE PREG, ED: Preg Test, Ur: NEGATIVE

## 2015-04-08 LAB — LIPASE, BLOOD: Lipase: 19 U/L — ABNORMAL LOW (ref 22–51)

## 2015-04-08 MED ORDER — CEPHALEXIN 500 MG PO CAPS: 500.0000 mg | ORAL_CAPSULE | Freq: Two times a day (BID) | ORAL | Status: DC

## 2015-04-08 MED ORDER — KETOROLAC TROMETHAMINE 30 MG/ML IJ SOLN
30.0000 mg | Freq: Once | INTRAMUSCULAR | Status: AC
  Administered 2015-04-08: 30 mg via INTRAVENOUS
  Filled 2015-04-08: qty 1

## 2015-04-08 MED ORDER — CEPHALEXIN 250 MG PO CAPS
500.0000 mg | ORAL_CAPSULE | Freq: Once | ORAL | Status: AC
  Administered 2015-04-08: 500 mg via ORAL
  Filled 2015-04-08: qty 2

## 2015-04-08 MED ORDER — METRONIDAZOLE 500 MG PO TABS
500.0000 mg | ORAL_TABLET | Freq: Two times a day (BID) | ORAL | Status: DC
Start: 2015-04-08 — End: 2017-09-21

## 2015-04-08 MED ORDER — PROMETHAZINE HCL 25 MG PO TABS: 25.0000 mg | ORAL_TABLET | Freq: Four times a day (QID) | ORAL | Status: DC | PRN

## 2015-04-08 MED ORDER — SODIUM CHLORIDE 0.9 % IV BOLUS (SEPSIS)
1000.0000 mL | Freq: Once | INTRAVENOUS | Status: AC
  Administered 2015-04-08: 1000 mL via INTRAVENOUS

## 2015-04-08 MED ORDER — IBUPROFEN 800 MG PO TABS: 800.0000 mg | ORAL_TABLET | Freq: Three times a day (TID) | ORAL | Status: DC | PRN

## 2015-04-08 MED ORDER — FENTANYL CITRATE (PF) 100 MCG/2ML IJ SOLN
50.0000 ug | Freq: Once | INTRAMUSCULAR | Status: AC
  Administered 2015-04-08: 50 ug via INTRAVENOUS
  Filled 2015-04-08: qty 2

## 2015-04-08 MED ORDER — TRAMADOL HCL 50 MG PO TABS: 50.0000 mg | ORAL_TABLET | Freq: Four times a day (QID) | ORAL | Status: DC | PRN

## 2015-04-08 MED ORDER — METRONIDAZOLE 500 MG PO TABS
500.0000 mg | ORAL_TABLET | Freq: Once | ORAL | Status: AC
  Administered 2015-04-08: 500 mg via ORAL
  Filled 2015-04-08: qty 1

## 2015-04-08 MED ORDER — ONDANSETRON HCL 4 MG/2ML IJ SOLN
4.0000 mg | Freq: Once | INTRAMUSCULAR | Status: AC
  Administered 2015-04-08: 4 mg via INTRAVENOUS
  Filled 2015-04-08: qty 2

## 2015-04-08 NOTE — ED Provider Notes (Signed)
This chart was scribed for Rebecca Flynn Rebecca Pritchard, DO by Abel Presto, ED Scribe. This patient was seen in room D32C/D32C.  TIME SEEN: 1:55 AM  CHIEF COMPLAINT: Pelvic Pain  HPI: Rebecca Flynn is a 34 y.o. female with PMHx of trichomonas, DUB currently on Megace, PID, pyelonephritis and hydrosalpinx, frequent UTIs who presents to the Emergency Department complaining of lower abdominal pain. Pt notes associated vomiting around 30 minutes after eating expired salad dressing. She states "it feels like something is thumping in my stomach."  Pt unable to tolerate food and fluid intake as she has had several episodes of vomiting. Pt reports h/o herpes genitalis, C-section and recurrent UTI. She notes symptoms feel similar to previous UTI except for the vomiting. Pt denies diarrhea, back pain, dysuria, vaginal bleeding, and vaginal discharge.   ROS: See HPI Constitutional: no fever  Eyes: no drainage  ENT: no runny nose   Cardiovascular:  no chest pain  Resp: no SOB  GI:  vomiting GU: no dysuria Integumentary: no rash  Allergy: no hives  Musculoskeletal: no leg swelling  Neurological: no slurred speech ROS otherwise negative  PAST MEDICAL HISTORY/PAST SURGICAL HISTORY:  Past Medical History  Diagnosis Date  . Bronchitis   . Herpes genitalis   . Asthma   . Hydrosalpinx   . Urinary tract infection   . Pyelonephritis   . PID (pelvic inflammatory disease)   . Chlamydia   . Trichomonas   . DUB (dysfunctional uterine bleeding)     MEDICATIONS:  Prior to Admission medications   Medication Sig Start Date End Date Taking? Authorizing Provider  Aspirin-Salicylamide-Caffeine (BC HEADACHE POWDER PO) Take 1 packet by mouth every 8 (eight) hours as needed (for headache).     Historical Provider, MD  azithromycin (ZITHROMAX) 250 MG tablet Take 4 tablets (1,000 mg total) by mouth once. 12/21/14   Shade Flood, MD  megestrol (MEGACE) 40 MG tablet Take 1 tablet (40 mg total) by mouth daily. 05/01/14    Tereso Newcomer, MD  naproxen sodium (ALEVE) 220 MG tablet Take 220 mg by mouth daily as needed (For pain.).    Historical Provider, MD    ALLERGIES:  Allergies  Allergen Reactions  . Penicillins Nausea And Vomiting and Rash    SOCIAL HISTORY:  History  Substance Use Topics  . Smoking status: Former Smoker -- 0.50 packs/day for 17 years    Types: Cigarettes  . Smokeless tobacco: Never Used  . Alcohol Use: No     Comment: occ    FAMILY HISTORY: Family History  Problem Relation Age of Onset  . Anesthesia problems Neg Hx   . Other Neg Hx     EXAM: BP 131/105 mmHg  Pulse 61  Temp(Src) 98.1 F (36.7 C) (Oral)  Resp 20  Wt 232 lb 8 oz (105.461 kg)  SpO2 99% CONSTITUTIONAL: Alert and oriented and responds appropriately to questions. Well-appearing; well-nourished, nontoxic, in no distress HEAD: Normocephalic EYES: Conjunctivae clear, PERRL ENT: normal nose; no rhinorrhea; moist mucous membranes; pharynx without lesions noted NECK: Supple, no meningismus, no LAD  CARD: RRR; S1 and S2 appreciated; no murmurs, no clicks, no rubs, no gallops RESP: Normal chest excursion without splinting or tachypnea; breath sounds clear and equal bilaterally; no wheezes, no rhonchi, no rales, no hypoxia or respiratory distress, speaking full sentences ABD/GI: Normal bowel sounds; non-distended; soft, suprapubic tenderness, no tenderness at [place] no rebound, no guarding, no peritoneal signs BACK:  The back appears normal and is non-tender to  palpation, there is no CVA tenderness EXT: Normal ROM in all joints; non-tender to palpation; no edema; normal capillary refill; no cyanosis, no calf tenderness or swelling    GU: normal external genitalia; moderate thick white vaginal discharge with odor; no adnexal tenderness or fullness; no cervical motion tenderness; no vaginal bleeding SKIN: Normal color for age and race; warm NEURO: Moves all extremities equally, sensation to light touch  intact diffusely, cranial nerves II through XII intact PSYCH: The patient's mood and manner are appropriate. Grooming and personal hygiene are appropriate.  MEDICAL DECISION MAKING: Patient here with suprapubic tenderness, vomiting. She thinks she may have eaten expired salad dressing tonight. Her abdominal exam is relatively benign. She reports this feels similar to her prior UTIs as well. She does have a significant history of frequent UTIs, pyelonephritis, PID.  ED PROGRESS: Patient's labs show leukocytosis with left shift but this appears chronic for patient. Otherwise abdominal labs unremarkable. Urine shows moderate hemoglobin and bacteria. Will treat with Keflex for possible mild UTI. Doubt pyelonephritis given she is so well-appearing with no flank pain him a fever. Pelvic exam is also relatively unremarkable. Wet prep does show clue cells. We'll treat with Flagyl. I do not feel she needs any emergent abdominal imaging. I feel she is safe to be discharged home. Discussed return precautions. We'll discharge with ibuprofen and tramadol for pain, Phenergan for vomiting. No vomiting in the ED. Patient verbalizes understanding is comfortable with this plan.     I personally performed the services described in this documentation, which was scribed in my presence. The recorded information has been reviewed and is accurate.     Rebecca MawKristen N Adeli Frost, DO 04/08/15 [phone]

## 2015-04-08 NOTE — Discharge Instructions (Signed)
Bacterial Vaginosis Bacterial vaginosis is a vaginal infection that occurs when the normal balance of bacteria in the vagina is disrupted. It results from an overgrowth of certain bacteria. This is the most common vaginal infection in women of childbearing age. Treatment is important to prevent complications, especially in pregnant women, as it can cause a premature delivery. CAUSES  Bacterial vaginosis is caused by an increase in harmful bacteria that are normally present in smaller amounts in the vagina. Several different kinds of bacteria can cause bacterial vaginosis. However, the reason that the condition develops is not fully understood. RISK FACTORS Certain activities or behaviors can put you at an increased risk of developing bacterial vaginosis, including:  Having a new sex partner or multiple sex partners.  Douching.  Using an intrauterine device (IUD) for contraception. Women do not get bacterial vaginosis from toilet seats, bedding, swimming pools, or contact with objects around them. SIGNS AND SYMPTOMS  Some women with bacterial vaginosis have no signs or symptoms. Common symptoms include:  Grey vaginal discharge.  A fishlike odor with discharge, especially after sexual intercourse.  Itching or burning of the vagina and vulva.  Burning or pain with urination. DIAGNOSIS  Your health care provider will take a medical history and examine the vagina for signs of bacterial vaginosis. A sample of vaginal fluid may be taken. Your health care provider will look at this sample under a microscope to check for bacteria and abnormal cells. A vaginal pH test may also be done.  TREATMENT  Bacterial vaginosis may be treated with antibiotic medicines. These may be given in the form of a pill or a vaginal cream. A second round of antibiotics may be prescribed if the condition comes back after treatment.  HOME CARE INSTRUCTIONS   Only take over-the-counter or prescription medicines as  directed by your health care provider.  If antibiotic medicine was prescribed, take it as directed. Make sure you finish it even if you start to feel better.  Do not have sex until treatment is completed.  Tell all sexual partners that you have a vaginal infection. They should see their health care provider and be treated if they have problems, such as a mild rash or itching.  Practice safe sex by using condoms and only having one sex partner. SEEK MEDICAL CARE IF:   Your symptoms are not improving after 3 days of treatment.  You have increased discharge or pain.  You have a fever. MAKE SURE YOU:   Understand these instructions.  Will watch your condition.  Will get help right away if you are not doing well or get worse. FOR MORE INFORMATION  Centers for Disease Control and Prevention, Division of STD Prevention: [employer]raiser[employer]raud.fi American Sexual Health Association (ASHA): www.ashastd.org  Document Released: 11/02/2005 Document Revised: 08/23/2013 Document Reviewed: 06/14/2013 [facility] Patient Information 2015 [place], [place]. This information is not intended to replace advice given to you by your health care provider. Make sure you discuss any questions you have with your health care provider.  Urinary Tract Infection Urinary tract infections (UTIs) can develop anywhere along your urinary tract. Your urinary tract is your body's drainage system for removing wastes and extra water. Your urinary tract includes two kidneys, two ureters, a bladder, and a urethra. Your kidneys are a pair of bean-shaped organs. Each kidney is about the size of your fist. They are located below your ribs, one on each side of your spine. CAUSES Infections are caused by microbes, which are microscopic organisms, including fungi, viruses,  and bacteria. These organisms are so small that they can only be seen through a microscope. Bacteria are the microbes that most commonly cause UTIs. SYMPTOMS  Symptoms of  UTIs may vary by age and gender of the patient and by the location of the infection. Symptoms in young women typically include a frequent and intense urge to urinate and a painful, burning feeling in the bladder or urethra during urination. Older women and men are more likely to be tired, shaky, and weak and have muscle aches and abdominal pain. A fever may mean the infection is in your kidneys. Other symptoms of a kidney infection include pain in your back or sides below the ribs, nausea, and vomiting. DIAGNOSIS To diagnose a UTI, your caregiver will ask you about your symptoms. Your caregiver also will ask to provide a urine sample. The urine sample will be tested for bacteria and white blood cells. White blood cells are made by your body to help fight infection. TREATMENT  Typically, UTIs can be treated with medication. Because most UTIs are caused by a bacterial infection, they usually can be treated with the use of antibiotics. The choice of antibiotic and length of treatment depend on your symptoms and the type of bacteria causing your infection. HOME CARE INSTRUCTIONS  If you were prescribed antibiotics, take them exactly as your caregiver instructs you. Finish the medication even if you feel better after you have only taken some of the medication.  Drink enough water and fluids to keep your urine clear or pale yellow.  Avoid caffeine, tea, and carbonated beverages. They tend to irritate your bladder.  Empty your bladder often. Avoid holding urine for long periods of time.  Empty your bladder before and after sexual intercourse.  After a bowel movement, women should cleanse from front to back. Use each tissue only once. SEEK MEDICAL CARE IF:   You have back pain.  You develop a fever.  Your symptoms do not begin to resolve within 3 days. SEEK IMMEDIATE MEDICAL CARE IF:   You have severe back pain or lower abdominal pain.  You develop chills.  You have nausea or vomiting.  You  have continued burning or discomfort with urination. MAKE SURE YOU:   Understand these instructions.  Will watch your condition.  Will get help right away if you are not doing well or get worse. Document Released: 08/12/2005 Document Revised: 05/03/2012 Document Reviewed: 12/11/2011 [facility] Patient Information 2015 [place]. This information is not intended to replace advice given to you by your health care provider. Make sure you discuss any questions you have with your health care provider.  Antibiotic Medication Antibiotic medicine helps fight germs. Germs cause infections. This type of medicine will not work for colds, flu, or other viral infections. Tell your doctor if you:  Are allergic to any medicines.  Are pregnant or are trying to get pregnant.  Are taking other medicines.  Have other medical problems. HOME CARE  Take your medicine with a glass of water or food as told by your doctor.  Take the medicine as told. Finish them even if you start to feel better.  Do not give your medicine to other people.  Do not use your medicine in the future for a different infection.  Ask your doctor about which side effects to watch for.  Try not to miss any doses. If you miss a dose, take it as soon as possible. If it is almost time for your next dose, and your  dosing schedule is:  Two doses a day, take the missed dose and the next dose 5 to 6 hours later.  Three or more doses a day, take the missed dose and the next dose 2 to 4 hours later, or double your next dose.  Then go back to your normal schedule. GET HELP RIGHT AWAY IF:   You get worse or do not get better within a few days.  The medicine makes you sick.  You develop a rash or any other side effects.  You have questions or concerns. MAKE SURE YOU:  Understand these instructions.  Will watch your condition.  Will get help right away if you are not doing well or get worse. Document Released: 08/11/2008  Document Revised: 01/25/2012 Document Reviewed: 10/08/2009 [facility] Patient Information 2015 [place], [place]. This information is not intended to replace advice given to you by your health care provider. Make sure you discuss any questions you have with your health care provider.

## 2015-04-08 NOTE — ED Notes (Signed)
Pt requesting some more pain medication.

## 2015-04-08 NOTE — ED Notes (Signed)
C/o pelvic pain, nausea, vomiting, and dysuria since 10:30pm tonight.  Reports history of frequent UTI.  Pt states symptoms started just after eating a salad tonight with expired salad dressing.

## 2015-04-16 ENCOUNTER — Encounter (HOSPITAL_BASED_OUTPATIENT_CLINIC_OR_DEPARTMENT_OTHER): Payer: Self-pay | Admitting: Emergency Medicine

## 2015-08-30 ENCOUNTER — Other Ambulatory Visit: Payer: Self-pay | Admitting: Obstetrics & Gynecology

## 2017-03-31 ENCOUNTER — Encounter (HOSPITAL_COMMUNITY): Payer: Self-pay | Admitting: *Deleted

## 2017-03-31 ENCOUNTER — Inpatient Hospital Stay (HOSPITAL_COMMUNITY)
Admission: AD | Admit: 2017-03-31 | Discharge: 2017-03-31 | Disposition: A | Discharge status: Home / Self Care | Payer: 59 | Source: Ambulatory Visit | Attending: Obstetrics and Gynecology | Admitting: Obstetrics and Gynecology

## 2017-03-31 DIAGNOSIS — Z79899 Other long term (current) drug therapy: Secondary | ICD-10-CM | POA: Insufficient documentation

## 2017-03-31 DIAGNOSIS — N939 Abnormal uterine and vaginal bleeding, unspecified: Secondary | ICD-10-CM | POA: Diagnosis not present

## 2017-03-31 DIAGNOSIS — Z87891 Personal history of nicotine dependence: Secondary | ICD-10-CM | POA: Diagnosis not present

## 2017-03-31 DIAGNOSIS — Z880 Allergy status to penicillin: Secondary | ICD-10-CM | POA: Diagnosis not present

## 2017-03-31 LAB — URINALYSIS, ROUTINE W REFLEX MICROSCOPIC
Bilirubin Urine: NEGATIVE
GLUCOSE, UA: NEGATIVE mg/dL
Ketones, ur: NEGATIVE mg/dL
Leukocytes, UA: NEGATIVE
Nitrite: NEGATIVE
PH: 6 (ref 5.0–8.0)
Protein, ur: NEGATIVE mg/dL
SPECIFIC GRAVITY, URINE: 1.018 (ref 1.005–1.030)

## 2017-03-31 LAB — CBC
HCT: 40.6 % (ref 36.0–46.0)
HEMOGLOBIN: 13.5 g/dL (ref 12.0–15.0)
MCH: 29.5 pg (ref 26.0–34.0)
MCHC: 33.3 g/dL (ref 30.0–36.0)
MCV: 88.6 fL (ref 78.0–100.0)
Platelets: 378 10*3/uL (ref 150–400)
RBC: 4.58 MIL/uL (ref 3.87–5.11)
RDW: 13.4 % (ref 11.5–15.5)
WBC: 14.4 10*3/uL — ABNORMAL HIGH (ref 4.0–10.5)

## 2017-03-31 LAB — POCT PREGNANCY, URINE: Preg Test, Ur: NEGATIVE

## 2017-03-31 MED ORDER — MEGESTROL ACETATE 40 MG PO TABS: 40.0000 mg | ORAL_TABLET | Freq: Every day | ORAL | 11 refills | Status: DC

## 2017-03-31 MED ORDER — MEGESTROL ACETATE 40 MG PO TABS
40.0000 mg | ORAL_TABLET | Freq: Once | ORAL | Status: AC
  Administered 2017-03-31: 40 mg via ORAL
  Filled 2017-03-31: qty 1

## 2017-03-31 NOTE — MAU Note (Signed)
Pt states she has been having vaginal bleeding since April 15th. States she has a hx of heavy vaginal bleeding. Used Megace last year when she had heavy bleeding. Pt states she has some lower abdominal cramping. Rates 8/10. States she has used ibuprofen-none today, but does not help.

## 2017-03-31 NOTE — Discharge Instructions (Signed)
Abnormal Uterine Bleeding Abnormal uterine bleeding can affect women at various stages in life, including teenagers, women in their reproductive years, pregnant women, and women who have reached menopause. Several kinds of uterine bleeding are considered abnormal, including:  Bleeding or spotting between periods.  Bleeding after sexual intercourse.  Bleeding that is heavier or more than normal.  Periods that last longer than usual.  Bleeding after menopause. Many cases of abnormal uterine bleeding are minor and simple to treat, while others are more serious. Any type of abnormal bleeding should be evaluated by your health care provider. Treatment will depend on the cause of the bleeding. Follow these instructions at home: Monitor your condition for any changes. The following actions may help to alleviate any discomfort you are experiencing:  Avoid the use of tampons and douches as directed by your health care provider.  Change your pads frequently. You should get regular pelvic exams and Pap tests. Keep all follow-up appointments for diagnostic tests as directed by your health care provider. Contact a health care provider if:  Your bleeding lasts more than 1 week.  You feel dizzy at times. Get help right away if:  You Un out.  You are changing pads every 15 to 30 minutes.  You have abdominal pain.  You have a fever.  You become sweaty or weak.  You are passing large blood clots from the vagina.  You start to feel nauseous and vomit. This information is not intended to replace advice given to you by your health care provider. Make sure you discuss any questions you have with your health care provider. Document Released: 11/02/2005 Document Revised: 04/15/2016 Document Reviewed: 06/01/2013 [employer] Interactive Patient Education  2017 [employer]  

## 2017-03-31 NOTE — MAU Provider Note (Signed)
History     CSN: [id number]  Arrival date and time: 03/31/17 4696   First Provider Initiated Contact with Patient 03/31/17 2200      Chief Complaint  Patient presents with  . Vaginal Bleeding   Rebecca Flynn is a 36 y.o. .[id number] who presents today with vaginal bleeding. She states that she started bleeding one month ago, and she has been bleeding every day since then. She had been on Megace in the past, and she stopped it in December because she ran out.    Vaginal Bleeding  The patient's primary symptoms include pelvic pain and vaginal bleeding. This is a new problem. The current episode started more than 1 month ago. The problem occurs constantly. The problem has been unchanged. Pain severity now: no pain at this time, but have a lot of pain at night when I get cramps.  The problem affects both sides. She is pregnant. Associated symptoms include headaches. Pertinent negatives include no chills, dysuria, fever, frequency, nausea, urgency or vomiting. The vaginal discharge was bloody. The vaginal bleeding is typical of menses. She has been passing clots (about the size of a cherry tomato. ). She has not been passing tissue. Nothing aggravates the symptoms. She has tried nothing ("bleeding was fine when I was on megace") for the symptoms. She uses nothing for contraception. Her menstrual history has been regular (LMP 02/28/17 ).    Past Medical History:  Diagnosis Date  . Asthma   . Bronchitis   . Chlamydia   . DUB (dysfunctional uterine bleeding)   . Herpes genitalis   . Hydrosalpinx   . PID (pelvic inflammatory disease)   . Pyelonephritis   . Trichomonas   . Urinary tract infection     Past Surgical History:  Procedure Laterality Date  . ANKLE SURGERY    . CESAREAN SECTION    . WISDOM TOOTH EXTRACTION      Family History  Problem Relation Age of Onset  . Anesthesia problems Neg Hx   . Other Neg Hx     Social History  Substance Use Topics  . Smoking status: Former  Smoker    Packs/day: 0.50    Years: 17.00    Types: Cigarettes  . Smokeless tobacco: Never Used  . Alcohol use No     Comment: occ    Allergies:  Allergies  Allergen Reactions  . Penicillins Nausea And Vomiting and Rash    Prescriptions Prior to Admission  Medication Sig Dispense Refill Last Dose  . acetaminophen (TYLENOL) 500 MG tablet Take 500 mg by mouth every 6 (six) hours as needed for moderate pain.   04/07/2015 at Unknown time  . azithromycin (ZITHROMAX) 250 MG tablet Take 4 tablets (1,000 mg total) by mouth once. (Patient not taking: Reported on 04/08/2015) 4 tablet 0 Not Taking at Unknown time  . cephALEXin (KEFLEX) 500 MG capsule Take 1 capsule (500 mg total) by mouth 2 (two) times daily. 14 capsule 0   . ibuprofen (ADVIL,MOTRIN) 800 MG tablet Take 1 tablet (800 mg total) by mouth every 8 (eight) hours as needed for mild pain. 30 tablet 0   . megestrol (MEGACE) 40 MG tablet TAKE 1 TABLET BY MOUTH ONCE DAILY 30 tablet 10   . metroNIDAZOLE (FLAGYL) 500 MG tablet Take 1 tablet (500 mg total) by mouth 2 (two) times daily. Do not drink alcohol with this medication. 14 tablet 0   . naproxen sodium (ALEVE) 220 MG tablet Take 220 mg by mouth daily as  needed (For pain.).   Past Month at Unknown time  . promethazine (PHENERGAN) 25 MG tablet Take 1 tablet (25 mg total) by mouth every 6 (six) hours as needed for nausea or vomiting. 15 tablet 0   . traMADol (ULTRAM) 50 MG tablet Take 1 tablet (50 mg total) by mouth every 6 (six) hours as needed. 15 tablet 0     Review of Systems  Constitutional: Negative for chills and fever.  Gastrointestinal: Negative for nausea and vomiting.  Genitourinary: Positive for pelvic pain and vaginal bleeding. Negative for dysuria, frequency and urgency.  Neurological: Positive for headaches.   Physical Exam   Blood pressure 134/90, pulse 70, temperature 98.4 F (36.9 C), temperature source Oral, resp. rate 20, height 5\' 6"  (1.676 m), weight 242 lb (109.8  kg), SpO2 100 %.  Physical Exam  Nursing note and vitals reviewed. Constitutional: She is oriented to person, place, and time. She appears well-developed and well-nourished. No distress.  HENT:  Head: Normocephalic.  Cardiovascular: Normal rate.   Respiratory: Effort normal.  GI: Soft. There is no tenderness. There is no rebound.  Neurological: She is alert and oriented to person, place, and time.  Skin: Skin is warm and dry.  Psychiatric: She has a normal mood and affect.   Results for orders placed or performed during the hospital encounter of 03/31/17 (from the past 24 hour(s))  Urinalysis, Routine w reflex microscopic     Status: Abnormal   Collection Time: 03/31/17  7:22 PM  Result Value Ref Range   Color, Urine YELLOW YELLOW   APPearance CLEAR CLEAR   Specific Gravity, Urine 1.018 1.005 - 1.030   pH 6.0 5.0 - 8.0   Glucose, UA NEGATIVE NEGATIVE mg/dL   Hgb urine dipstick LARGE (A) NEGATIVE   Bilirubin Urine NEGATIVE NEGATIVE   Ketones, ur NEGATIVE NEGATIVE mg/dL   Protein, ur NEGATIVE NEGATIVE mg/dL   Nitrite NEGATIVE NEGATIVE   Leukocytes, UA NEGATIVE NEGATIVE   RBC / HPF 6-30 0 - 5 RBC/hpf   WBC, UA 0-5 0 - 5 WBC/hpf   Bacteria, UA RARE (A) NONE SEEN   Squamous Epithelial / LPF 0-5 (A) NONE SEEN   Mucous PRESENT   Pregnancy, urine POC     Status: None   Collection Time: 03/31/17  7:37 PM  Result Value Ref Range   Preg Test, Ur NEGATIVE NEGATIVE  CBC     Status: Abnormal   Collection Time: 03/31/17 10:29 PM  Result Value Ref Range   WBC 14.4 (H) 4.0 - 10.5 K/uL   RBC 4.58 3.87 - 5.11 MIL/uL   Hemoglobin 13.5 12.0 - 15.0 g/dL   HCT 16.140.6 09.636.0 - 04.546.0 %   MCV 88.6 78.0 - 100.0 fL   MCH 29.5 26.0 - 34.0 pg   MCHC 33.3 30.0 - 36.0 g/dL   RDW 40.913.4 81.111.5 - 91.415.5 %   Platelets 378 150 - 400 K/uL    MAU Course  Procedures  MDM Patient given a dose of megace here. Will DC with megace, and patient will call the clinic for FU appt.   Assessment and Plan   1.  Abnormal uterine bleeding    DC home Comfort measures reviewed  Bleeding precautions RX: megace 40mg  QD #30 with 11 RF  Return to MAU as needed FU with OB as planned  Follow-up Information    [facility].   Specialty:  Obstetrics and Gynecology Contact information: [address] [place] [id number]  [phone]           Thressa Sheller 03/31/2017, 10:05 PM

## 2017-07-07 DIAGNOSIS — L708 Other acne: Secondary | ICD-10-CM | POA: Diagnosis not present

## 2017-08-04 DIAGNOSIS — L820 Inflamed seborrheic keratosis: Secondary | ICD-10-CM | POA: Diagnosis not present

## 2017-09-10 ENCOUNTER — Ambulatory Visit (HOSPITAL_COMMUNITY)
Admission: EM | Admit: 2017-09-10 | Discharge: 2017-09-10 | Disposition: A | Discharge status: Home / Self Care | Payer: 59 | Attending: Emergency Medicine | Admitting: Emergency Medicine

## 2017-09-10 ENCOUNTER — Encounter (HOSPITAL_COMMUNITY): Payer: Self-pay | Admitting: Emergency Medicine

## 2017-09-10 DIAGNOSIS — L03115 Cellulitis of right lower limb: Secondary | ICD-10-CM | POA: Diagnosis not present

## 2017-09-10 MED ORDER — SULFAMETHOXAZOLE-TRIMETHOPRIM 800-160 MG PO TABS: 1.0000 | ORAL_TABLET | Freq: Two times a day (BID) | ORAL | 0 refills | Status: DC

## 2017-09-10 NOTE — Discharge Instructions (Signed)
If redness extends passed the line I have drawn 36 hours from now please return to be seen. If worsening fevers, chill develop please return to be seen. Continue with warm pack applications, ibuprofen for pain. Complete entire course of antibiotics.

## 2017-09-10 NOTE — ED Provider Notes (Addendum)
[facility]    CSN: [id number] Arrival date & time: 09/10/17  1231     History   Chief Complaint Chief Complaint  Patient presents with  . Abscess    HPI Rebecca Flynn is a 36 y.o. female.   Rebecca Flynn presents with complaints of right groin boil which has worsened in size and pain since origination 3 days ago. She "popped" the boil three days ago, has since been without drainage. She feels generally fatigued, subjective fevers. Low appetite. She has had a similar boil on her arm once in the past. Denies uri symptoms. Rates pain 10/10, worse with walking. Has applied warm packs which does help some.    ROS per HPI.       Past Medical History:  Diagnosis Date  . Asthma   . Bronchitis   . Chlamydia   . DUB (dysfunctional uterine bleeding)   . Herpes genitalis   . Hydrosalpinx   . PID (pelvic inflammatory disease)   . Pyelonephritis   . Trichomonas   . Urinary tract infection     Patient Active Problem List   Diagnosis Date Noted  . Obesity 01/05/2014  . Smoker 01/05/2014  . Infertility 01/05/2014  . DUB (dysfunctional uterine bleeding) 12/09/2012    Past Surgical History:  Procedure Laterality Date  . ANKLE SURGERY    . CESAREAN SECTION    . WISDOM TOOTH EXTRACTION      OB History    Gravida Para Term Preterm AB Living   2 2 2     2    SAB TAB Ectopic Multiple Live Births                   Home Medications    Prior to Admission medications   Medication Sig Start Date End Date Taking? Authorizing Provider  megestrol (MEGACE) 40 MG tablet Take 1 tablet (40 mg total) by mouth daily. 03/31/17  Yes Thressa ShellerHogan, Heather D, CNM  acetaminophen (TYLENOL) 500 MG tablet Take 500 mg by mouth every 6 (six) hours as needed for moderate pain.    [provider]  azithromycin (ZITHROMAX) 250 MG tablet Take 4 tablets (1,000 mg total) by mouth once. Patient not taking: Reported on 04/08/2015 12/21/14   Shade FloodGreene, Jeffrey R, MD  cephALEXin (KEFLEX) 500 MG  capsule Take 1 capsule (500 mg total) by mouth 2 (two) times daily. 04/08/15   Ward, Layla MawKristen N, DO  ibuprofen (ADVIL,MOTRIN) 800 MG tablet Take 1 tablet (800 mg total) by mouth every 8 (eight) hours as needed for mild pain. 04/08/15   Ward, Layla MawKristen N, DO  metroNIDAZOLE (FLAGYL) 500 MG tablet Take 1 tablet (500 mg total) by mouth 2 (two) times daily. Do not drink alcohol with this medication. 04/08/15   Ward, Layla MawKristen N, DO  naproxen sodium (ALEVE) 220 MG tablet Take 220 mg by mouth daily as needed (For pain.).    [provider]  promethazine (PHENERGAN) 25 MG tablet Take 1 tablet (25 mg total) by mouth every 6 (six) hours as needed for nausea or vomiting. 04/08/15   Ward, Layla MawKristen N, DO  sulfamethoxazole-trimethoprim (BACTRIM DS,SEPTRA DS) 800-160 MG tablet Take 1 tablet by mouth 2 (two) times daily. 09/10/17 09/20/17  Georgetta HaberBurky, Natalie B, NP  traMADol (ULTRAM) 50 MG tablet Take 1 tablet (50 mg total) by mouth every 6 (six) hours as needed. 04/08/15   Ward, Layla MawKristen N, DO    Family History Family History  Problem Relation Age of Onset  . Anesthesia problems  Neg Hx   . Other Neg Hx     Social History Social History  Substance Use Topics  . Smoking status: Former Smoker    Packs/day: 0.50    Years: 17.00    Types: Cigarettes  . Smokeless tobacco: Never Used  . Alcohol use No     Comment: occ     Allergies   Penicillins   Review of Systems Review of Systems   Physical Exam Triage Vital Signs ED Triage Vitals  Enc Vitals Group     BP 09/10/17 1249 135/90     Pulse Rate 09/10/17 1249 (!) 110     Resp 09/10/17 1249 20     Temp 09/10/17 1249 98.7 F (37.1 C)     Temp Source 09/10/17 1249 Oral     SpO2 09/10/17 1249 97 %     Weight --      Height --      Head Circumference --      Peak Flow --      Pain Score 09/10/17 1250 10     Pain Loc --      Pain Edu? --      Excl. in GC? --    No data found.   Updated Vital Signs BP 135/90 (BP Location: Right Arm)   Pulse (!)  110 Comment: notified vitals to [facility] [employer]  Temp 98.7 F (37.1 C) (Oral)   Resp 20   SpO2 97%   Visual Acuity Right Eye Distance:   Left Eye Distance:   Bilateral Distance:    Right Eye Near:   Left Eye Near:    Bilateral Near:     Physical Exam  Constitutional: She appears well-developed and well-nourished. No distress.  Cardiovascular: Regular rhythm.   Pulmonary/Chest: Effort normal and breath sounds normal.  Skin: Skin is warm and dry.     Approximately 15cm in diameter red warm firm area which expands from noted scabbed hair follicle. Without palpable abscess. Warm to touch, without drainage  Vitals reviewed.    UC Treatments / Results  Labs (all labs ordered are listed, but only abnormal results are displayed) Labs Reviewed - No data to display  EKG  EKG Interpretation None       Radiology No results found.  Procedures Procedures (including critical care time)  Medications Ordered in UC Medications - No data to display   Initial Impression / Assessment and Plan / UC Course  I have reviewed the triage vital signs and the nursing notes.  Pertinent labs & imaging results that were available during my care of the patient were reviewed by me and considered in my medical decision making (see chart for details).     Without drainable abscess present but with obvious cellulitis to right thigh/groin. Bactrim bid for 10 days initiated. Take first dose immediately. Redness marked with skin pen for monitoring. Continue with warm pack application, ibuprofen for pain. If symptoms worsen or do not improve in the next week to return to be seen or to follow up with PCP. Patient verbalized understanding and agreeable to plan.    Georgetta Haber, NP 09/10/2017 1:12 PM   Final Clinical Impressions(s) / UC Diagnoses   Final diagnoses:  Cellulitis of right lower extremity    New Prescriptions New Prescriptions   SULFAMETHOXAZOLE-TRIMETHOPRIM (BACTRIM  DS,SEPTRA DS) 800-160 MG TABLET    Take 1 tablet by mouth 2 (two) times daily.     Controlled Substance Prescriptions  Controlled Substance Registry consulted?  Not Applicable   Georgetta Haber, NP 09/10/17 1312    Linus Mako B, NP 09/10/17 1312

## 2017-09-10 NOTE — ED Triage Notes (Signed)
Pt here for abscess on right leg/groin area onset 3 days  Reports she tried to pop it   A&O x4... NAD... Ambulatory

## 2017-09-13 DIAGNOSIS — B9689 Other specified bacterial agents as the cause of diseases classified elsewhere: Secondary | ICD-10-CM | POA: Diagnosis not present

## 2017-09-13 DIAGNOSIS — L02214 Cutaneous abscess of groin: Secondary | ICD-10-CM | POA: Diagnosis not present

## 2017-09-13 DIAGNOSIS — Z0189 Encounter for other specified special examinations: Secondary | ICD-10-CM | POA: Diagnosis not present

## 2017-09-15 ENCOUNTER — Inpatient Hospital Stay (HOSPITAL_COMMUNITY)
Admission: EM | Admit: 2017-09-15 | Discharge: 2017-09-21 | DRG: 603 | Disposition: A | Discharge status: Home Health | Payer: 59 | Attending: Family Medicine | Admitting: Family Medicine

## 2017-09-15 ENCOUNTER — Emergency Department (HOSPITAL_COMMUNITY): Payer: 59

## 2017-09-15 ENCOUNTER — Encounter (HOSPITAL_COMMUNITY): Payer: Self-pay | Admitting: Emergency Medicine

## 2017-09-15 DIAGNOSIS — Z6839 Body mass index (BMI) 39.0-39.9, adult: Secondary | ICD-10-CM | POA: Diagnosis not present

## 2017-09-15 DIAGNOSIS — R102 Pelvic and perineal pain: Secondary | ICD-10-CM | POA: Diagnosis not present

## 2017-09-15 DIAGNOSIS — N938 Other specified abnormal uterine and vaginal bleeding: Secondary | ICD-10-CM | POA: Diagnosis present

## 2017-09-15 DIAGNOSIS — J45909 Unspecified asthma, uncomplicated: Secondary | ICD-10-CM | POA: Diagnosis present

## 2017-09-15 DIAGNOSIS — Z79899 Other long term (current) drug therapy: Secondary | ICD-10-CM | POA: Diagnosis not present

## 2017-09-15 DIAGNOSIS — Z880 Allergy status to penicillin: Secondary | ICD-10-CM | POA: Diagnosis not present

## 2017-09-15 DIAGNOSIS — Z87891 Personal history of nicotine dependence: Secondary | ICD-10-CM

## 2017-09-15 DIAGNOSIS — L03314 Cellulitis of groin: Principal | ICD-10-CM | POA: Diagnosis present

## 2017-09-15 DIAGNOSIS — F419 Anxiety disorder, unspecified: Secondary | ICD-10-CM | POA: Diagnosis present

## 2017-09-15 DIAGNOSIS — E669 Obesity, unspecified: Secondary | ICD-10-CM | POA: Diagnosis not present

## 2017-09-15 DIAGNOSIS — L03115 Cellulitis of right lower limb: Secondary | ICD-10-CM | POA: Diagnosis present

## 2017-09-15 DIAGNOSIS — R000 Tachycardia, unspecified: Secondary | ICD-10-CM | POA: Diagnosis not present

## 2017-09-15 DIAGNOSIS — D72829 Elevated white blood cell count, unspecified: Secondary | ICD-10-CM | POA: Diagnosis not present

## 2017-09-15 HISTORY — DX: Cellulitis of right lower limb: L03.115

## 2017-09-15 LAB — HEMOGLOBIN A1C
Hgb A1c MFr Bld: 5.6 % (ref 4.8–5.6)
Mean Plasma Glucose: 114.02 mg/dL

## 2017-09-15 LAB — BASIC METABOLIC PANEL
ANION GAP: 12 (ref 5–15)
BUN: 7 mg/dL (ref 6–20)
CO2: 21 mmol/L — ABNORMAL LOW (ref 22–32)
Calcium: 9.4 mg/dL (ref 8.9–10.3)
Chloride: 100 mmol/L — ABNORMAL LOW (ref 101–111)
Creatinine, Ser: 1.05 mg/dL — ABNORMAL HIGH (ref 0.44–1.00)
GFR calc non Af Amer: >60 mL/min (ref >60)
Glucose, Bld: 125 mg/dL — ABNORMAL HIGH (ref 65–99)
Potassium: 3.9 mmol/L (ref 3.5–5.1)
Sodium: 133 mmol/L — ABNORMAL LOW (ref 135–145)

## 2017-09-15 LAB — CBC WITH DIFFERENTIAL/PLATELET
BASOS ABS: 0.2 10*3/uL — AB (ref 0.0–0.1)
Basophils Relative: 1 %
Eosinophils Absolute: 0 10*3/uL (ref 0.0–0.7)
Eosinophils Relative: 0 %
HEMATOCRIT: 44.9 % (ref 36.0–46.0)
Hemoglobin: 15.6 g/dL — ABNORMAL HIGH (ref 12.0–15.0)
LYMPHS ABS: 2.2 10*3/uL (ref 0.7–4.0)
LYMPHS PCT: 10 %
MCH: 30.1 pg (ref 26.0–34.0)
MCHC: 34.7 g/dL (ref 30.0–36.0)
MCV: 86.5 fL (ref 78.0–100.0)
MONOS PCT: 11 %
Monocytes Absolute: 2.4 10*3/uL — ABNORMAL HIGH (ref 0.1–1.0)
NEUTROS PCT: 78 %
Neutro Abs: 16.8 10*3/uL — ABNORMAL HIGH (ref 1.7–7.7)
Platelets: 446 10*3/uL — ABNORMAL HIGH (ref 150–400)
RBC: 5.19 MIL/uL — AB (ref 3.87–5.11)
RDW: 13.4 % (ref 11.5–15.5)
WBC: 21.6 10*3/uL — AB (ref 4.0–10.5)

## 2017-09-15 MED ORDER — VANCOMYCIN HCL 10 G IV SOLR
2000.0000 mg | Freq: Once | INTRAVENOUS | Status: AC
  Administered 2017-09-15: 2000 mg via INTRAVENOUS
  Filled 2017-09-15: qty 2000

## 2017-09-15 MED ORDER — DEXTROSE 5 % IV SOLN
1.0000 g | Freq: Once | INTRAVENOUS | Status: AC
  Administered 2017-09-15: 1 g via INTRAVENOUS
  Filled 2017-09-15: qty 10

## 2017-09-15 MED ORDER — SODIUM CHLORIDE 0.9 % IV SOLN
INTRAVENOUS | Status: DC
  Administered 2017-09-15 – 2017-09-16 (×4): via INTRAVENOUS

## 2017-09-15 MED ORDER — ACETAMINOPHEN 325 MG PO TABS
650.0000 mg | ORAL_TABLET | Freq: Four times a day (QID) | ORAL | Status: DC | PRN
  Filled 2017-09-15: qty 2

## 2017-09-15 MED ORDER — HYDROMORPHONE HCL 1 MG/ML IJ SOLN
1.0000 mg | Freq: Once | INTRAMUSCULAR | Status: AC
  Administered 2017-09-15: 1 mg via INTRAVENOUS
  Filled 2017-09-15: qty 1

## 2017-09-15 MED ORDER — OXYCODONE HCL 5 MG PO TABS
5.0000 mg | ORAL_TABLET | ORAL | Status: DC | PRN
  Administered 2017-09-15 – 2017-09-20 (×19): 5 mg via ORAL
  Filled 2017-09-15 (×20): qty 1

## 2017-09-15 MED ORDER — ENOXAPARIN SODIUM 40 MG/0.4ML ~~LOC~~ SOLN
40.0000 mg | SUBCUTANEOUS | Status: DC
  Administered 2017-09-16: 40 mg via SUBCUTANEOUS
  Filled 2017-09-15: qty 0.4

## 2017-09-15 MED ORDER — ONDANSETRON HCL 4 MG/2ML IJ SOLN
4.0000 mg | Freq: Once | INTRAMUSCULAR | Status: AC
  Administered 2017-09-15: 4 mg via INTRAVENOUS
  Filled 2017-09-15: qty 2

## 2017-09-15 MED ORDER — ONDANSETRON HCL 4 MG PO TABS: 4.0000 mg | ORAL_TABLET | Freq: Four times a day (QID) | ORAL | Status: DC | PRN

## 2017-09-15 MED ORDER — IOPAMIDOL (ISOVUE-300) INJECTION 61%
INTRAVENOUS | Status: AC
  Administered 2017-09-15: 100 mL
  Filled 2017-09-15: qty 100

## 2017-09-15 MED ORDER — ACETAMINOPHEN 650 MG RE SUPP: 650.0000 mg | Freq: Four times a day (QID) | RECTAL | Status: DC | PRN

## 2017-09-15 MED ORDER — POLYETHYLENE GLYCOL 3350 17 G PO PACK: 17.0000 g | PACK | Freq: Every day | ORAL | Status: DC | PRN

## 2017-09-15 MED ORDER — VANCOMYCIN HCL IN DEXTROSE 750-5 MG/150ML-% IV SOLN
750.0000 mg | Freq: Two times a day (BID) | INTRAVENOUS | Status: DC
  Filled 2017-09-15: qty 150

## 2017-09-15 MED ORDER — ONDANSETRON HCL 4 MG/2ML IJ SOLN: 4.0000 mg | Freq: Four times a day (QID) | INTRAMUSCULAR | Status: DC | PRN

## 2017-09-15 NOTE — H&P (Signed)
Family Medicine Teaching [facility] Admission History and Physical Service Pager: [phone]  Patient name: Rebecca Flynn Medical record number: [id number] Date of birth: 1981/02/16 Age: 36 y.o. Gender: female  Primary Care Provider: Patient, No Pcp Per Consultants: none Code Status: full   Chief Complaint: cellulitis   Assessment and Plan: Rebecca Flynn is a 36 y.o. female presenting with right groin abscess and concern for cellulitis. PMH is significant for obesity and dysfunctional uterine bleeding.   Right medial thigh/inguinal cellulitis Failed outpatient oral treatment with bactrim and cipro. Patient states symptoms began with small abscess in inguinal fold on 10/25 at which time she received bactrim (10/26) and cipro (10/29). Infection continued to spread throughout thigh and inguinal region. CT on admission confirming cellulitis with no drainable abscess noted. although patient notes tactile fever, patient has remained afebrile but with leukocytosis of 21. Patient also slightly tachycardic on admission, with HR reaching max of 111. Margins on exam today showing significant worsening/spreading of infection compared to markings from urgent care on 10/26. Patient given 1g Rocephin in ED. Unfortunately blood cultures were not obtained in ED prior to administration of antibiotics.  -admit to med-surg, attending Dr. Gwendolyn Grant -vitals qshift -IV vancomycin   -acetaminophen 500mg  q6hrs prn -blood cultures x2 -consider culturing wound drainage  -zofran prn  -oxycodone 5mg  q4hrs prn -miralax prn  -am CBC and BMP  Dysfunctional uterine bleeding Patient takes megace as needed for dysfunctional uterine bleeding. States she has not needed to take megace in 1 week. Patient is not actively bleeding now.   FEN/GI: regular diet Prophylaxis: lovenox  Disposition: admit to med-surg, attending Dr. Gwendolyn Grant  History of Present Illness:  Rebecca Flynn is a 36 y.o. female presenting with right groin  abscess and concern for cellulitis.   Patient stated that symptoms began on Thursday, 10/25 with small abscess in inguinal fold. Patient was seen in [facility] on 10/26 at which time patient was noted to have drainable abscess or without obvious cellultitis. Patient was discharged with bactrim bid x 10days along with warm compresses and ibuprofen for pain management. Patient then went to dermatologist on 10/29 and received cipro. Patient states she has been taking antibiotics daily with no relief. Infection continues to spread and is tender to palpation. Area has had purulent malodorous drainage. Patient states she has never had infection of this severity in past. She has had small "boils" under her arms and on her forearm but they go away with no antibiotics or with short course of oral antibiotics. States mother has history of having several boils. Denies PMHx of diabetes, but states it runs in her family. Patient has has tactile fever, chills, and night sweats. Endorses nausea but denies vomiting. Denies dizziness and lightheadedness. Denies symptoms of urinary frequency, urgency, or dysuria.    Today patient came to ED due to increased foul smelling drainage from area. Patient was afebrile with elevated WBC to 21.6. CT scan of pelvis showing cellulitis of medial right thigh and inguinal area but no discrete drainable soft tissue abscess, myofasciitis, pyomyositis, septic arthritis, or osteomyelitis. Patient was given 1g Rocephin in ED.   Patient was former smoker, but has not smoked in years. Has occasional alcohol use on special occasions. Denies illicit drug use.   Review Of Systems: Per HPI with the following additions:   Review of Systems  Constitutional: Positive for chills, diaphoresis and fever.  Gastrointestinal: Positive for nausea. Negative for vomiting.  Genitourinary: Negative for dysuria, frequency and urgency.  Skin: Positive for rash.  Neurological: Negative for dizziness.     Patient Active Problem List   Diagnosis Date Noted  . Cellulitis of right thigh 09/15/2017  . Obesity 01/05/2014  . Smoker 01/05/2014  . Infertility 01/05/2014  . DUB (dysfunctional uterine bleeding) 12/09/2012    Past Medical History: Past Medical History:  Diagnosis Date  . Asthma   . Bronchitis   . Chlamydia   . DUB (dysfunctional uterine bleeding)   . Herpes genitalis   . Hydrosalpinx   . PID (pelvic inflammatory disease)   . Pyelonephritis   . Trichomonas   . Urinary tract infection     Past Surgical History: Past Surgical History:  Procedure Laterality Date  . ANKLE SURGERY    . CESAREAN SECTION    . WISDOM TOOTH EXTRACTION      Social History: Social History  Substance Use Topics  . Smoking status: Former Smoker    Packs/day: 0.50    Years: 17.00    Types: Cigarettes  . Smokeless tobacco: Never Used  . Alcohol use No     Comment: occ   Additional social history: former smoker, occasional alcohol on special occasions, no illicit drug use  Please also refer to relevant sections of EMR.  Family History: Family History  Problem Relation Age of Onset  . Anesthesia problems Neg Hx   . Other Neg Hx     Allergies and Medications: Allergies  Allergen Reactions  . Penicillins Nausea And Vomiting and Rash   No current facility-administered medications on file prior to encounter.    Current Outpatient Prescriptions on File Prior to Encounter  Medication Sig Dispense Refill  . acetaminophen (TYLENOL) 500 MG tablet Take 500 mg by mouth every 6 (six) hours as needed for moderate pain.    . megestrol (MEGACE) 40 MG tablet Take 1 tablet (40 mg total) by mouth daily. (Patient taking differently: Take 40 mg by mouth daily as needed (for menstral cycle). ) 30 tablet 11  . sulfamethoxazole-trimethoprim (BACTRIM DS,SEPTRA DS) 800-160 MG tablet Take 1 tablet by mouth 2 (two) times daily. 20 tablet 0  . azithromycin (ZITHROMAX) 250 MG tablet Take 4 tablets (1,000  mg total) by mouth once. (Patient not taking: Reported on 04/08/2015) 4 tablet 0  . cephALEXin (KEFLEX) 500 MG capsule Take 1 capsule (500 mg total) by mouth 2 (two) times daily. 14 capsule 0  . ibuprofen (ADVIL,MOTRIN) 800 MG tablet Take 1 tablet (800 mg total) by mouth every 8 (eight) hours as needed for mild pain. 30 tablet 0  . metroNIDAZOLE (FLAGYL) 500 MG tablet Take 1 tablet (500 mg total) by mouth 2 (two) times daily. Do not drink alcohol with this medication. 14 tablet 0  . naproxen sodium (ALEVE) 220 MG tablet Take 220 mg by mouth daily as needed (For pain.).    Marland Kitchen promethazine (PHENERGAN) 25 MG tablet Take 1 tablet (25 mg total) by mouth every 6 (six) hours as needed for nausea or vomiting. 15 tablet 0  . traMADol (ULTRAM) 50 MG tablet Take 1 tablet (50 mg total) by mouth every 6 (six) hours as needed. 15 tablet 0    Objective: BP 126/69 (BP Location: Right Arm)   Pulse 99   Temp 97.9 F (36.6 C) (Oral)   Resp 20   Wt 242 lb (109.8 kg)   SpO2 100%   BMI 39.06 kg/m  Exam: General: awake and alert, anxious regarding infection Eyes: EOMI, PERRL ENTM: dry mucous membranes  Cardiovascular: RRR, no  MRG Respiratory: CTAB, no wheezes, rales, or rhonchi  Gastrointestinal: soft, non tender, non distended, bowel sounds normal  MSK: warm, well perfused, tenderness to palpation of right thigh Neuro: sensation intact bilaterally, Psych: normal affect  Derm:       Labs and Imaging: CBC BMET   Recent Labs Lab 09/15/17 0940  WBC 21.6*  HGB 15.6*  HCT 44.9  PLT 446*    Recent Labs Lab 09/15/17 0940  NA 133*  K 3.9  CL 100*  CO2 21*  BUN 7  CREATININE 1.05*  GLUCOSE 125*  CALCIUM 9.4       Ref. Range 09/15/2017 09:40  Neutrophils Latest Units: % 78  Lymphocytes Latest Units: % 10  Monocytes Relative Latest Units: % 11  Eosinophil Latest Units: % 0  Basophil Latest Units: % 1  NEUT# Latest Ref Range: 1.7 - 7.7 K/uL 16.8 (H)  Lymphocyte # Latest Ref Range: 0.7 -  4.0 K/uL 2.2  Monocyte # Latest Ref Range: 0.1 - 1.0 K/uL 2.4 (H)  Eosinophils Absolute Latest Ref Range: 0.0 - 0.7 K/uL 0.0  Basophils Absolute Latest Ref Range: 0.0 - 0.1 K/uL 0.2 (H)  WBC Morphology Unknown ATYPICAL LYMPHOCYTES    Ct Pelvis W Contrast  Result Date: 09/15/2017 CLINICAL DATA:  Evaluate open wound in the medial thigh region. Recent lancing of a subcutaneous abscess. EXAM: CT PELVIS WITH CONTRAST TECHNIQUE: Multidetector CT imaging of the pelvis was performed using the standard protocol following the bolus administration of intravenous contrast. CONTRAST:  100mL ISOVUE-300 IOPAMIDOL (ISOVUE-300) INJECTION 61% COMPARISON:  None. FINDINGS: Urinary Tract: The bladder is unremarkable. It is mildly distended. No bladder lesion or bladder calculi. No distal ureteral calculi. Bowel: The visualized small bowel and colon are unremarkable. The appendix is normal. Vascular/Lymphatic: Mildly enlarged/ inflamed right external iliac lymph nodes and right inguinal lymph nodes. The major vascular structures are normal. Reproductive:  Normal uterus and ovaries. Other:  No intrapelvic inflammatory process/abscess. Musculoskeletal: There is an open wound involving the the lower right groin area with air in the subcutaneous tissues. No discrete drainable soft tissue abscess is identified. Diffuse skin thickening and subcutaneous edema consistent with cellulitis. No findings to suggest myofasciitis or pyomyositis. Associated inflamed/enlarged/hyperplastic right inguinal lymph nodes and right external iliac lymph nodes. The bony structures are unremarkable. No findings to suggest septic arthritis or osteomyelitis. IMPRESSION: 1. CT findings consistent with focus of significant cellulitis involving the medial right thigh and inguinal area but no discrete drainable soft tissue abscess, myofasciitis, pyomyositis, septic arthritis or osteomyelitis. 2. No significant intrapelvic abnormalities. Electronically Signed    By: Rudie MeyerP.  Gallerani M.D.   On: 09/15/2017 12:08    Oralia ManisAbraham, Harlon Kutner, DO 09/15/2017, 4:42 PM PGY-1, [facility] FPTS Intern pager: [phone], text pages welcome

## 2017-09-15 NOTE — ED Provider Notes (Signed)
[facility] Provider Note   CSN: [id number] Arrival date & time: 09/15/17  [id number]     History   Chief Complaint Chief Complaint  Patient presents with  . Groin Abcess    HPI Rebecca Flynn is a 36 y.o. female.  The history is provided by the patient and a parent. No language interpreter was used.  Leg Pain   This is a new problem. The current episode started more than 2 days ago. The problem occurs constantly. The problem has been gradually worsening. The pain is present in the right lower leg. The quality of the pain is described as aching. The pain is moderate. Pertinent negatives include no numbness. She has tried nothing for the symptoms. The treatment provided no relief. There has been no history of extremity trauma.  Pt reports she has an infection to her right groin.  Pt reports pain and swelling.  Pt reports area has been draining.  Pt thinks swelling is going down.  Pt reports foul odor.  Pt reports area started with an ingrown hair.   Past Medical History:  Diagnosis Date  . Asthma   . Bronchitis   . Chlamydia   . DUB (dysfunctional uterine bleeding)   . Herpes genitalis   . Hydrosalpinx   . PID (pelvic inflammatory disease)   . Pyelonephritis   . Trichomonas   . Urinary tract infection     Patient Active Problem List   Diagnosis Date Noted  . Obesity 01/05/2014  . Smoker 01/05/2014  . Infertility 01/05/2014  . DUB (dysfunctional uterine bleeding) 12/09/2012    Past Surgical History:  Procedure Laterality Date  . ANKLE SURGERY    . CESAREAN SECTION    . WISDOM TOOTH EXTRACTION      OB History    Gravida Para Term Preterm AB Living   2 2 2     2    SAB TAB Ectopic Multiple Live Births                   Home Medications    Prior to Admission medications   Medication Sig Start Date End Date Taking? Authorizing Provider  acetaminophen (TYLENOL) 500 MG tablet Take 500 mg by mouth every 6 (six) hours as needed  for moderate pain.   Yes [provider]  ciprofloxacin (CIPRO) 500 MG tablet Take 500 mg by mouth 2 (two) times daily. 09/13/17 09/22/17 Yes [provider]  ibuprofen (ADVIL,MOTRIN) 200 MG tablet Take 600 mg by mouth every 6 (six) hours as needed for mild pain.   Yes [provider]  megestrol (MEGACE) 40 MG tablet Take 1 tablet (40 mg total) by mouth daily. Patient taking differently: Take 40 mg by mouth daily as needed (for menstral cycle).  03/31/17  Yes Thressa ShellerHogan, Heather D, CNM  sulfamethoxazole-trimethoprim (BACTRIM DS,SEPTRA DS) 800-160 MG tablet Take 1 tablet by mouth 2 (two) times daily. 09/10/17 09/20/17 Yes Burky, Barron AlvineNatalie B, NP  azithromycin (ZITHROMAX) 250 MG tablet Take 4 tablets (1,000 mg total) by mouth once. Patient not taking: Reported on 04/08/2015 12/21/14   Shade FloodGreene, Jeffrey R, MD  cephALEXin (KEFLEX) 500 MG capsule Take 1 capsule (500 mg total) by mouth 2 (two) times daily. 04/08/15   Ward, Layla MawKristen N, DO  ibuprofen (ADVIL,MOTRIN) 800 MG tablet Take 1 tablet (800 mg total) by mouth every 8 (eight) hours as needed for mild pain. 04/08/15   Ward, Layla MawKristen N, DO  metroNIDAZOLE (FLAGYL) 500 MG tablet Take 1  tablet (500 mg total) by mouth 2 (two) times daily. Do not drink alcohol with this medication. 04/08/15   Ward, Layla Maw, DO  naproxen sodium (ALEVE) 220 MG tablet Take 220 mg by mouth daily as needed (For pain.).    [provider]  promethazine (PHENERGAN) 25 MG tablet Take 1 tablet (25 mg total) by mouth every 6 (six) hours as needed for nausea or vomiting. 04/08/15   Ward, Layla Maw, DO  traMADol (ULTRAM) 50 MG tablet Take 1 tablet (50 mg total) by mouth every 6 (six) hours as needed. 04/08/15   Ward, Layla Maw, DO    Family History Family History  Problem Relation Age of Onset  . Anesthesia problems Neg Hx   . Other Neg Hx     Social History Social History  Substance Use Topics  . Smoking status: Former Smoker    Packs/day: 0.50    Years: 17.00     Types: Cigarettes  . Smokeless tobacco: Never Used  . Alcohol use No     Comment: occ     Allergies   Penicillins   Review of Systems Review of Systems  Neurological: Negative for numbness.  All other systems reviewed and are negative.    Physical Exam Updated Vital Signs BP 113/82   Pulse 91   Resp 20   Wt 109.8 kg (242 lb)   SpO2 99%   BMI 39.06 kg/m   Physical Exam  Constitutional: She is oriented to person, place, and time. She appears well-developed and well-nourished.  HENT:  Head: Normocephalic.  Eyes: EOM are normal.  Neck: Normal range of motion.  Pulmonary/Chest: Effort normal.  Abdominal: She exhibits no distension.  Musculoskeletal: Normal range of motion.  Firm swollen area right groin,  Oozing foul smelling, swelling lower abdominal area,   Neurological: She is alert and oriented to person, place, and time.  Psychiatric: She has a normal mood and affect.  Nursing note and vitals reviewed.    ED Treatments / Results  Labs (all labs ordered are listed, but only abnormal results are displayed) Labs Reviewed  CBC WITH DIFFERENTIAL/PLATELET - Abnormal; Notable for the following:       Result Value   WBC 21.6 (*)    RBC 5.19 (*)    Hemoglobin 15.6 (*)    Platelets 446 (*)    Neutro Abs 16.8 (*)    Monocytes Absolute 2.4 (*)    Basophils Absolute 0.2 (*)    All other components within normal limits  BASIC METABOLIC PANEL - Abnormal; Notable for the following:    Sodium 133 (*)    Chloride 100 (*)    CO2 21 (*)    Glucose, Bld 125 (*)    Creatinine, Ser 1.05 (*)    All other components within normal limits    EKG  EKG Interpretation None       Radiology Ct Pelvis W Contrast  Result Date: 09/15/2017 CLINICAL DATA:  Evaluate open wound in the medial thigh region. Recent lancing of a subcutaneous abscess. EXAM: CT PELVIS WITH CONTRAST TECHNIQUE: Multidetector CT imaging of the pelvis was performed using the standard protocol  following the bolus administration of intravenous contrast. CONTRAST:  ISOVUE-300 IOPAMIDOL (ISOVUE-300) INJECTION 61% COMPARISON:  None. FINDINGS: Urinary Tract: The bladder is unremarkable. It is mildly distended. No bladder lesion or bladder calculi. No distal ureteral calculi. Bowel: The visualized small bowel and colon are unremarkable. The appendix is normal. Vascular/Lymphatic: Mildly enlarged/ inflamed right external iliac lymph  nodes and right inguinal lymph nodes. The major vascular structures are normal. Reproductive:  Normal uterus and ovaries. Other:  No intrapelvic inflammatory process/abscess. Musculoskeletal: There is an open wound involving the the lower right groin area with air in the subcutaneous tissues. No discrete drainable soft tissue abscess is identified. Diffuse skin thickening and subcutaneous edema consistent with cellulitis. No findings to suggest myofasciitis or pyomyositis. Associated inflamed/enlarged/hyperplastic right inguinal lymph nodes and right external iliac lymph nodes. The bony structures are unremarkable. No findings to suggest septic arthritis or osteomyelitis. IMPRESSION: 1. CT findings consistent with focus of significant cellulitis involving the medial right thigh and inguinal area but no discrete drainable soft tissue abscess, myofasciitis, pyomyositis, septic arthritis or osteomyelitis. 2. No significant intrapelvic abnormalities. Electronically Signed   By: Rudie Meyer M.D.   On: 09/15/2017 12:08    Procedures Procedures (including critical care time)  Medications Ordered in ED Medications  HYDROmorphone (DILAUDID) injection 1 mg (not administered)  ondansetron (ZOFRAN) injection 4 mg (not administered)  cefTRIAXone (ROCEPHIN) 1 g in dextrose 5 % 50 mL IVPB (0 g Intravenous Stopped 09/15/17 1024)  iopamidol (ISOVUE-300) 61 % injection (100 mLs  Contrast Given 09/15/17 1143)     Initial Impression / Assessment and Plan / ED Course  I have  reviewed the triage vital signs and the nursing notes.  Pertinent labs & imaging results that were available during my care of the patient were reviewed by me and considered in my medical decision making (see chart for details).     Pt has elevated wbc count.  Pt given Rocephin 1 gram IV.   Ct scan shows cellulitis.  I will consult unassigned medicine for admission  Final Clinical Impressions(s) / ED Diagnoses   Final diagnoses:  Cellulitis of right lower extremity    New Prescriptions New Prescriptions   No medications on file   I spoke to family Practice  Unassigned on call.  They will admit for treatment.   Elson Areas, [place] 09/15/17 1433    Tegeler, Canary Brim, MD 09/15/17 2133

## 2017-09-15 NOTE — ED Triage Notes (Signed)
Pt in from home [facility] with c/o softball sized R groin abcess. Pt states 1 wk ago, ingrown hair was present at the area and pt popped the abcess. Seen in UC on Friday, was given Bactrim and Cipro, has been compliant. This morning, pt states the abcess has been oozing blood and pus with bad odor. Afebrile, 98.8

## 2017-09-15 NOTE — ED Notes (Signed)
Patient transported to CT 

## 2017-09-15 NOTE — Progress Notes (Signed)
Pharmacy Antibiotic Note  Rebecca Flynn is a 36 y.o. female admitted on 09/15/2017 with cellulitis.  Pharmacy has been consulted for cellulitis dosing. Patient here with fevers and nausea and failed outpatient bactrim. CT without drainable abscess. Patient has a vancomycin 2 gm IV loading dose already ordered. WBC elevated at 21.6. CrCl ~ 85-90 mL/min.   Plan: -Start vancomycin 750 mg IV Q 12 hours  -Monitor CBC, renal fx, cultures and clinical progress -[facility]   Weight: 242 lb (109.8 kg)  Temp (24hrs), Avg:98.1 F (36.7 C), Min:97.9 F (36.6 C), Max:98.3 F (36.8 C)   Recent Labs Lab 09/15/17 0940  WBC 21.6*  CREATININE 1.05*    Estimated Creatinine Clearance: 91.2 mL/min (A) (by C-G formula based on SCr of 1.05 mg/dL (H)).    Allergies  Allergen Reactions  . Penicillins Nausea And Vomiting and Rash    Antimicrobials this admission: Vanc 10/31 >>   Dose adjustments this admission: None   Microbiology results: 10/31 BCx:   Thank you for allowing pharmacy to be a part of this patient's care.  Vinnie LevelBenjamin Kahiau Schewe, PharmD., BCPS Clinical Pharmacist Pager [phone]

## 2017-09-15 NOTE — ED Notes (Signed)
Attempted Report x1.   

## 2017-09-16 LAB — BASIC METABOLIC PANEL
ANION GAP: 10 (ref 5–15)
BUN: 6 mg/dL (ref 6–20)
CHLORIDE: 105 mmol/L (ref 101–111)
CO2: 17 mmol/L — ABNORMAL LOW (ref 22–32)
Calcium: 8.3 mg/dL — ABNORMAL LOW (ref 8.9–10.3)
Creatinine, Ser: 0.74 mg/dL (ref 0.44–1.00)
Glucose, Bld: 84 mg/dL (ref 65–99)
POTASSIUM: 3.8 mmol/L (ref 3.5–5.1)
SODIUM: 132 mmol/L — AB (ref 135–145)

## 2017-09-16 LAB — CBC
HEMATOCRIT: 37.9 % (ref 36.0–46.0)
Hemoglobin: 13 g/dL (ref 12.0–15.0)
MCH: 29.8 pg (ref 26.0–34.0)
MCHC: 34.3 g/dL (ref 30.0–36.0)
MCV: 86.9 fL (ref 78.0–100.0)
Platelets: 358 10*3/uL (ref 150–400)
RBC: 4.36 MIL/uL (ref 3.87–5.11)
RDW: 13.3 % (ref 11.5–15.5)
WBC: 13.6 10*3/uL — AB (ref 4.0–10.5)

## 2017-09-16 LAB — HIV ANTIBODY (ROUTINE TESTING W REFLEX): HIV SCREEN 4TH GENERATION: NONREACTIVE

## 2017-09-16 MED ORDER — ACETAMINOPHEN 325 MG PO TABS
650.0000 mg | ORAL_TABLET | Freq: Four times a day (QID) | ORAL | Status: DC
  Administered 2017-09-16 – 2017-09-21 (×21): 650 mg via ORAL
  Filled 2017-09-16 (×21): qty 2

## 2017-09-16 MED ORDER — VANCOMYCIN HCL IN DEXTROSE 750-5 MG/150ML-% IV SOLN
750.0000 mg | Freq: Three times a day (TID) | INTRAVENOUS | Status: DC
  Administered 2017-09-16 – 2017-09-17 (×4): 750 mg via INTRAVENOUS
  Filled 2017-09-16 (×5): qty 150

## 2017-09-16 NOTE — Discharge Summary (Signed)
Family Medicine Teaching [facility] Discharge Summary  Patient name: Rebecca ChurnCindy R Ting Medical record number: [id number] Date of birth: 02/05/1981 Age: 36 y.o. Gender: female Date of Admission: 09/15/2017  Date of Discharge: 09/21/2017 Admitting Physician: Tobey GrimJeffrey H Walden, MD  Primary Care Provider: Patient, No Pcp Per Consultants: None  Indication for Hospitalization: R groin cellulitis  Discharge Diagnoses/Problem List:  R groin cellulitis, improved Dysfunctional uterine bleeding, stable Obesity  Disposition: Home  Discharge Condition: Improved  Discharge Exam:  General: obese female, very pleasant Cardiovascular: RRR, no murmurs/rubs/gallops Respiratory: CTA bilaterally, no wheezes or crackles Abdomen: soft, non tender to palpation Extremities: R groin erythematous margins receded beneath current wound dressing; remains slightly firm and still tender to palpation although improving. Wound packed and dressed.  Brief Hospital Course:  Rebecca Flynn a 36 y.o.femalewith PMH significant for obesity and dysfunctional uterine bleeding who presented to ED with right groin abscess and concern for cellulitis after outpatient failure with Bactrim and Ciprofloxacin. In the ED, labs were notable for WBC 21 and slight tachycardia to 111 with margins expanded beyond markings drawn at Urgent Care and poor pain control. CT pelvis consistent with cellulitis with no drainable abscess. No growth noted on blood cultures, however were drawn after initiation of antibiotics. Prior to discharge, her WBC normalized to 11.6 with history of chronic leukocytosis.  She remained on IV Vancomycin for 3 days before being transitioned to PO antibiotics on Doxycycline, which she subsequently failed and was placed back on IV Vancomycin with improvement for 2 days. She was then transitioned to Clindamycin with ID in consult on 11/5 with a 14 day total course of antibiotics. She tolerated Clindamycin well and  remained afebrile with stable vital signs throughout her admission.   Issues for Follow Up:  1. She will continue on 8 day course of Clindamycin.  She will finish on 11/14. Ensure toleration and completion of antibiotic course. 2. [facility]. Recommendations from wound nurse: "Recommend packing daily with 1/4" iodoform packing strip. Pack each tunneled area and leaving a wick outside of the tunnel to allow for drainage to wick to topper dry dressing/ABD pad, secure with tape if possible."  Significant Procedures: None  Significant Labs and Imaging:  Recent Labs  Lab 09/18/17 0500 09/19/17 0522 09/21/17 0721  WBC 10.8* 9.5 11.6*  HGB 13.0 13.3 13.9  HCT 39.2 40.2 42.2  PLT 458* 511* 517*   Recent Labs  Lab 09/15/17 0940 09/16/17 0518 09/18/17 0500 09/19/17 0522 09/21/17 0721  NA 133* 132* 137 137 138  K 3.9 3.8 4.0 4.1 4.3  CL 100* 105 109 108 109  CO2 21* 17* 21* 21* 21*  GLUCOSE 125* 84 89 90 91  BUN 7 6 8 9 10   CREATININE 1.05* 0.74 0.63 0.69 0.82  CALCIUM 9.4 8.3* 8.7* 8.7* 9.2  ALKPHOS  --   --   --   --  83  AST  --   --   --   --  88*  ALT  --   --   --   --  94*  ALBUMIN  --   --   --   --  3.1*   CT Pelvis w/ contrast FINDINGS: Urinary Tract: The bladder is unremarkable. It is mildly distended. No bladder lesion or bladder calculi. No distal ureteral calculi. Bowel: The visualized small bowel and colon are unremarkable. The appendix is normal. Vascular/Lymphatic: Mildly enlarged/ inflamed right external iliac lymph nodes  and right inguinal lymph nodes. The major vascular structures are normal. Reproductive: Normal uterus and ovaries. Other: No intrapelvic inflammatory process/abscess. Musculoskeletal: There is an open wound involving the the lower right groin area with air in the subcutaneous tissues. No discrete drainable soft tissue abscess is identified. Diffuse skin thickening and subcutaneous edema  consistent with cellulitis. No findings to suggest myofasciitis or pyomyositis. Associated inflamed/enlarged/hyperplastic right inguinal lymph nodes and right external iliac lymph nodes. The bony structures are unremarkable. No findings to suggest septic arthritis or osteomyelitis. IMPRESSION: 1. CT findings consistent with focus of significant cellulitis involving the medial right thigh and inguinal area but no discrete drainable soft tissue abscess, myofasciitis, pyomyositis, septic arthritis or osteomyelitis. 2. No significant intrapelvic abnormalities.  Results/Tests Pending at Time of Discharge: None  Discharge Medications:  Allergies as of 09/21/2017      Reactions   Penicillins Nausea And Vomiting, Rash      Medication List    STOP taking these medications   ALEVE 220 MG tablet Generic drug:  naproxen sodium   azithromycin 250 MG tablet Commonly known as:  ZITHROMAX   cephALEXin 500 MG capsule Commonly known as:  KEFLEX   ciprofloxacin 500 MG tablet Commonly known as:  CIPRO   metroNIDAZOLE 500 MG tablet Commonly known as:  FLAGYL   sulfamethoxazole-trimethoprim 800-160 MG tablet Commonly known as:  BACTRIM DS,SEPTRA DS     TAKE these medications   acetaminophen 500 MG tablet Commonly known as:  TYLENOL Take 500 mg by mouth every 6 (six) hours as needed for moderate pain.   clindamycin 300 MG capsule Commonly known as:  CLEOCIN Take 1 capsule (300 mg total) 4 (four) times daily for 8 days by mouth.   ibuprofen 800 MG tablet Commonly known as:  ADVIL,MOTRIN Take 1 tablet (800 mg total) by mouth every 8 (eight) hours as needed for mild pain. What changed:  Another medication with the same name was removed. Continue taking this medication, and follow the directions you see here.   megestrol 40 MG tablet Commonly known as:  MEGACE Take 1 tablet (40 mg total) by mouth daily. What changed:    when to take this  reasons to take this   promethazine 25  MG tablet Commonly known as:  PHENERGAN Take 1 tablet (25 mg total) by mouth every 6 (six) hours as needed for nausea or vomiting.   traMADol 50 MG tablet Commonly known as:  ULTRAM Take 1 tablet (50 mg total) by mouth every 6 (six) hours as needed.            Durable Medical Equipment  (From admission, onward)        Start     Ordered   09/21/17 1052  For home use only DME 3 n 1  Once     09/21/17 1052   09/21/17 1052  For home use only DME Walker rolling  Once    Question:  Patient needs a walker to treat with the following condition  Answer:  Right groin wound   09/21/17 1052      Discharge Instructions: Please refer to Patient Instructions section of EMR for full details.  Patient was counseled important signs and symptoms that should prompt return to medical care, changes in medications, dietary instructions, activity restrictions, and follow up appointments.   Follow-Up Appointments: Follow-up Information    [facility]Home Follow up.   Why:  A representative from [facility] will contact you to arrange start  date and time for nursing visit. Contact information: [address] [place] [address] [phone]           Ellwood Dense, DO 09/21/2017, 6:50 PM PGY-1, [facility]

## 2017-09-16 NOTE — Progress Notes (Signed)
Family Medicine Teaching Service Daily Progress Note Intern Pager: [phone]  Patient name: Rebecca Flynn Medical record number: [id number] Date of birth: 06/04/1981 Age: 36 y.o. Gender: female  Primary Care Provider: Patient, No Pcp Per Consultants: None Code Status: Full  Pt Overview and Major Events to Date:  10/31 - admitted for R groin abscess, cellulitis  Assessment and Plan: Rebecca Flynn is a 36 y.o. female presenting with right groin abscess and concern for cellulitis. PMH is significant for obesity and dysfunctional uterine bleeding.   Right medial thigh/inguinal cellulitis Failed outpatient oral treatment with bactrim and cipro. Patient states symptoms began with small abscess in inguinal fold on 10/25 at which time she received bactrim (10/26) and cipro (10/29). Infection continued to spread throughout thigh and inguinal region. CT on admission confirming cellulitis with no drainable abscess noted. Although patient notes tactile fever, patient has remained afebrile but with leukocytosis of 21 and slight tachycardia to 111 on admission. Margins widened on admission based on markings drawn at Urgent Care. Patient given 1g Rocephin in ED with administration of antibiotics prior to cultures drawn. WBC and HR has since normalized to 13.6 and 80s, respectively. Doesn't feel that pain is well controlled, received 2 doses oxycodone overnight, no doses of tylenol. - vitals qshift - IV vancomycin   - acetaminophen 500mg  q6hrs prn - follow blood cultures - zofran prn  - oxycodone 5mg  q4hrs prn - miralax prn   Dysfunctional uterine bleeding Patient takes megace as needed for dysfunctional uterine bleeding. States she has not needed to take megace in 1 week. Patient is not actively bleeding now.   FEN/GI: regular diet Prophylaxis: lovenox  Disposition: continue inpatient admission for R groin cellulitis  Subjective:  Feeling ok today, doesn't feel that pain is well controlled.  Diluadid in ED helped more than Oxycodone. Denies nausea or extension of pain.  Objective: Temp:  [97.9 F (36.6 C)-98.5 F (36.9 C)] 98.2 F (36.8 C) (11/01 0351) Pulse Rate:  [73-106] 73 (11/01 0351) Resp:  [16-24] 16 (11/01 0351) BP: (106-154)/(63-105) 106/85 (11/01 0351) SpO2:  [95 %-100 %] 95 % (11/01 0351) Weight:  [109.8 kg (242 lb)] 109.8 kg (242 lb) (10/31 0913)  Physical Exam: General: obese female in mild distress, tearing Cardiovascular: RRR, no murmurs/rubs/gallops Respiratory: CTA bilaterally, no wheezes or crackles Abdomen: soft, non tender to palpation Extremities: R groin erythematous margins back down to borders drawn in Urgent Care, firm and extremely tender to light palpation. Wound actively draining.  Laboratory:  Recent Labs Lab 09/15/17 0940 09/16/17 0518  WBC 21.6* 13.6*  HGB 15.6* 13.0  HCT 44.9 37.9  PLT 446* 358    Recent Labs Lab 09/15/17 0940 09/16/17 0518  NA 133* 132*  K 3.9 3.8  CL 100* 105  CO2 21* 17*  BUN 7 6  CREATININE 1.05* 0.74  CALCIUM 9.4 8.3*  GLUCOSE 125* 84   A1c - 5.6  Imaging/Diagnostic Tests:  CT Pelvis w/ contrast FINDINGS: Urinary Tract: The bladder is unremarkable. It is mildly distended. No bladder lesion or bladder calculi. No distal ureteral calculi. Bowel: The visualized small bowel and colon are unremarkable. The appendix is normal. Vascular/Lymphatic: Mildly enlarged/ inflamed right external iliac lymph nodes and right inguinal lymph nodes. The major vascular structures are normal. Reproductive:  Normal uterus and ovaries. Other:  No intrapelvic inflammatory process/abscess. Musculoskeletal: There is an open wound involving the the lower right groin area with air in the subcutaneous tissues. No discrete drainable soft tissue abscess is identified.  Diffuse skin thickening and subcutaneous edema consistent with cellulitis. No findings to suggest myofasciitis or pyomyositis. Associated  inflamed/enlarged/hyperplastic right inguinal lymph nodes and right external iliac lymph nodes. The bony structures are unremarkable. No findings to suggest septic arthritis or osteomyelitis. IMPRESSION: 1. CT findings consistent with focus of significant cellulitis involving the medial right thigh and inguinal area but no discrete drainable soft tissue abscess, myofasciitis, pyomyositis, septic arthritis or osteomyelitis. 2. No significant intrapelvic abnormalities.  [name], Alison, DO 09/16/2017, 7:15 AM PGY-1, [facility] Intern pager: [phone], text pages welcome

## 2017-09-16 NOTE — Progress Notes (Signed)
Pharmacy Antibiotic Note  Rebecca Flynn is a 36 y.o. female admitted on 09/15/2017 with cellulitis.  Pharmacy has been consulted for vancomycin dosing.  Continues on abx for inguinal cellulitis and R groin abscess. CT showed no drainable abscess. Was on Bactrim PTA but continued to worsen. Afebrile, WBC down to 13.6. SCr improved today.  Plan: Increase vancomycin to 750mg  IV Q8h Monitor clinical picture, renal function, VT prn F/U C&S, abx deescalation / LOT   Weight: 242 lb (109.8 kg)  Temp (24hrs), Avg:98.3 F (36.8 C), Min:97.9 F (36.6 C), Max:98.5 F (36.9 C)   Recent Labs Lab 09/15/17 0940 09/16/17 0518  WBC 21.6* 13.6*  CREATININE 1.05* 0.74    Estimated Creatinine Clearance: 119.7 mL/min (by C-G formula based on SCr of 0.74 mg/dL).    Allergies  Allergen Reactions  . Penicillins Nausea And Vomiting and Rash    Antimicrobials this admission: Vanc 10/31 >>   Dose adjustments this admission: None   Microbiology results: 10/31 BCx: sent  Thank you for allowing pharmacy to be a part of this patient's care.  Enzo BiNathan Jansel Vonstein, PharmD, BCPS Clinical Pharmacist Pager [phone] 09/16/2017 9:03 AM

## 2017-09-17 DIAGNOSIS — D72829 Elevated white blood cell count, unspecified: Secondary | ICD-10-CM

## 2017-09-17 LAB — CBC
HCT: 39.1 % (ref 36.0–46.0)
Hemoglobin: 12.8 g/dL (ref 12.0–15.0)
MCH: 28.8 pg (ref 26.0–34.0)
MCHC: 32.7 g/dL (ref 30.0–36.0)
MCV: 88.1 fL (ref 78.0–100.0)
PLATELETS: 444 10*3/uL — AB (ref 150–400)
RBC: 4.44 MIL/uL (ref 3.87–5.11)
RDW: 13.7 % (ref 11.5–15.5)
WBC: 12.4 10*3/uL — ABNORMAL HIGH (ref 4.0–10.5)

## 2017-09-17 MED ORDER — ENOXAPARIN SODIUM 60 MG/0.6ML ~~LOC~~ SOLN
50.0000 mg | SUBCUTANEOUS | Status: DC
  Administered 2017-09-17 – 2017-09-20 (×4): 50 mg via SUBCUTANEOUS
  Filled 2017-09-17 (×4): qty 0.6

## 2017-09-17 MED ORDER — DOXYCYCLINE HYCLATE 100 MG PO TABS
100.0000 mg | ORAL_TABLET | Freq: Two times a day (BID) | ORAL | Status: DC
  Administered 2017-09-17 – 2017-09-18 (×3): 100 mg via ORAL
  Filled 2017-09-17 (×3): qty 1

## 2017-09-17 MED ORDER — LORAZEPAM 1 MG PO TABS
1.0000 mg | ORAL_TABLET | Freq: Every day | ORAL | Status: DC | PRN
  Administered 2017-09-17 – 2017-09-18 (×2): 1 mg via ORAL
  Filled 2017-09-17 (×2): qty 1

## 2017-09-17 NOTE — Progress Notes (Signed)
Family Medicine Teaching Service Daily Progress Note Intern Pager: [phone]  Patient name: Rebecca Flynn Medical record number: [id number] Date of birth: [birth year] Age: 36 y.o. Gender: female  Primary Care Provider: Patient, No Pcp Per Consultants: None Code Status: Full  Pt Overview and Major Events to Date:  10/31 - admitted for R groin abscess, cellulitis  Assessment and Plan: Rebecca Flynn is a 36 y.o. female presenting with right groin abscess and concern for cellulitis. PMH is significant for obesity and dysfunctional uterine bleeding.   Right medial thigh/inguinal cellulitis Patient states symptoms began with small abscess in inguinal fold on 10/25 at which time she received bactrim (10/26) and cipro (10/29) with little relief and spread of infection to thigh. CT on admission confirming cellulitis with no drainable abscess noted. Patient has remained afebrile. Leukocytosis of 21 on admission down to 12.4. Tachycardia to 111 on admission has resolved. S/p 1g Rocephin in ED with blood cultures drawn subsequently. Blood cultures NG x24hrs. Margins widened on admission based on markings drawn at Urgent Care but has receded since starting Vancomycin. Patient feels pain is better controlled since scheduling tylenol yesterday. Received 2 doses of PRN oxycodone last night. - vitals qshift - transition to PO antibiotics today - acetaminophen 524m q6hrs  - follow blood cultures - zofran prn  - oxycodone 57mq4hrs prn - miralax prn  - wound consult  Chronic Leukocytosis Evidenced back to 2014. Appears leukocytosis of 12.4 is likely baseline and also likely reactive. No record of bone marrow biopsy in the past.  Dysfunctional uterine bleeding Patient takes megace as needed for dysfunctional uterine bleeding. States she has not needed to take megace in 1 week. Patient is not actively bleeding now.   FEN/GI: regular diet Prophylaxis: lovenox  Disposition: continue inpatient admission  for R groin cellulitis  Subjective:  Patient feels better today but still in pain, feels pain is better controlled. Is apprehensive about restarting PO antibiotics.  Objective: Temp:  [98 F (36.7 C)-98.1 F (36.7 C)] 98.1 F (36.7 C) (11/01 2100) Pulse Rate:  [64-69] 69 (11/01 2100) Resp:  [17-18] 17 (11/01 2100) BP: (94-113)/(54-71) 113/71 (11/01 2100) SpO2:  [93 %-100 %] 100 % (11/01 2100)  Physical Exam: General: obese female in mild distress Cardiovascular: RRR, no murmurs/rubs/gallops Respiratory: CTA bilaterally, no wheezes or crackles Abdomen: soft, non tender to palpation Extremities: R groin erythematous margins receded proximal to borders drawn in Urgent Care, remains firm and extremely tender to light palpation. Wound actively draining.  Laboratory:  Recent Labs Lab 09/15/17 0940 09/16/17 0518 09/17/17 0426  WBC 21.6* 13.6* 12.4*  HGB 15.6* 13.0 12.8  HCT 44.9 37.9 39.1  PLT 446* 358 444*    Recent Labs Lab 09/15/17 0940 09/16/17 0518  NA 133* 132*  K 3.9 3.8  CL 100* 105  CO2 21* 17*  BUN 7 6  CREATININE 1.05* 0.74  CALCIUM 9.4 8.3*  GLUCOSE 125* 84   A1c - 5.6  Imaging/Diagnostic Tests:  CT Pelvis w/ contrast FINDINGS: Urinary Tract: The bladder is unremarkable. It is mildly distended. No bladder lesion or bladder calculi. No distal ureteral calculi. Bowel: The visualized small bowel and colon are unremarkable. The appendix is normal. Vascular/Lymphatic: Mildly enlarged/ inflamed right external iliac lymph nodes and right inguinal lymph nodes. The major vascular structures are normal. Reproductive:  Normal uterus and ovaries. Other:  No intrapelvic inflammatory process/abscess. Musculoskeletal: There is an open wound involving the the lower right groin area with air in the subcutaneous tissues.  No discrete drainable soft tissue abscess is identified. Diffuse skin thickening and subcutaneous edema consistent with cellulitis. No findings  to suggest myofasciitis or pyomyositis. Associated inflamed/enlarged/hyperplastic right inguinal lymph nodes and right external iliac lymph nodes. The bony structures are unremarkable. No findings to suggest septic arthritis or osteomyelitis. IMPRESSION: 1. CT findings consistent with focus of significant cellulitis involving the medial right thigh and inguinal area but no discrete drainable soft tissue abscess, myofasciitis, pyomyositis, septic arthritis or osteomyelitis. 2. No significant intrapelvic abnormalities.  Rory Percy, DO 09/17/2017, 6:39 AM PGY-1, [place] Intern pager: [phone], text pages welcome

## 2017-09-17 NOTE — Consult Note (Signed)
WOC Nurse wound consult note Reason for Consult: right thigh, draining wounds Patient with right thigh induration, superficial bulla formation and drainage.  Wound type: cellulitis, no abscess noted on CT Pressure Injury POA: NA Measurement:aprox. > 25cm x 25cm with two tunneled openings  Lateral: 2.5cm; Medial 4.5cm  Wound RUE:[id number]:openings with mild skin necrosis around each opening  Drainage (amount, consistency, odor) tan, with some odor Periwound: induration, erythema, tender Dressing procedure/placement/frequency: Recommend packing daily with 1/4" iodoform packing strip. Pack each tunneled area and leaving a wick outside of the tunnel to allow for drainage to wick to topper dry dressing/ABD pad, secure with tape if possible.  Teach patient if possible to perform wound care.   When DC to home may need HHRN to continue instruction on packing sites.   Discussed POC with patient and bedside nurse.  Re consult if needed, will not follow at this time. Thanks  Yuuki Skeens [employer] MSN, RN,CWOCN, CNS, CWON-AP [phone])

## 2017-09-18 LAB — BASIC METABOLIC PANEL
Anion gap: 7 (ref 5–15)
BUN: 8 mg/dL (ref 6–20)
CALCIUM: 8.7 mg/dL — AB (ref 8.9–10.3)
CO2: 21 mmol/L — ABNORMAL LOW (ref 22–32)
CREATININE: 0.63 mg/dL (ref 0.44–1.00)
Chloride: 109 mmol/L (ref 101–111)
GFR calc Af Amer: >60 mL/min (ref >60)
GLUCOSE: 89 mg/dL (ref 65–99)
POTASSIUM: 4 mmol/L (ref 3.5–5.1)
Sodium: 137 mmol/L (ref 135–145)

## 2017-09-18 LAB — CBC
HEMATOCRIT: 39.2 % (ref 36.0–46.0)
Hemoglobin: 13 g/dL (ref 12.0–15.0)
MCH: 29.1 pg (ref 26.0–34.0)
MCHC: 33.2 g/dL (ref 30.0–36.0)
MCV: 87.9 fL (ref 78.0–100.0)
Platelets: 458 10*3/uL — ABNORMAL HIGH (ref 150–400)
RBC: 4.46 MIL/uL (ref 3.87–5.11)
RDW: 13.4 % (ref 11.5–15.5)
WBC: 10.8 10*3/uL — ABNORMAL HIGH (ref 4.0–10.5)

## 2017-09-18 MED ORDER — VANCOMYCIN HCL IN DEXTROSE 1-5 GM/200ML-% IV SOLN
1000.0000 mg | Freq: Three times a day (TID) | INTRAVENOUS | Status: DC
  Administered 2017-09-18 – 2017-09-20 (×6): 1000 mg via INTRAVENOUS
  Filled 2017-09-18 (×8): qty 200

## 2017-09-18 NOTE — Progress Notes (Signed)
Family Medicine Teaching Service Daily Progress Note Intern Pager: [phone]  Patient name: Rebecca Flynn Medical record number: [id number] Date of birth: 10-10-1981 Age: 36 y.o. Gender: female  Primary Care Provider: Patient, No Pcp Per Consultants: None Code Status: Full  Pt Overview and Major Events to Date:  10/31 - admitted for R groin abscess, cellulitis 11/2 - transition to PO antibiotics (doxy) 11/3 - restart IV vancomycin  Assessment and Plan: Rebecca Flynn is a 36 y.o. female presenting with right groin abscess and concern for cellulitis. PMH is significant for obesity and dysfunctional uterine bleeding.   Right medial thigh/inguinal cellulitis Patient states symptoms began with small abscess in inguinal fold on 10/25 at which time she received bactrim (10/26) and cipro (10/29) with little relief and spread of infection to thigh. CT on admission confirming cellulitis with no drainable abscess noted. Patient has remained afebrile. Leukocytosis of 21 > 10.8, and patient with chronic leukocytosis. VSS. Remains afebrile. NGTD of blood cultures. Patient has noted spread laterally at R hip.  - vitals qshift - restart vancomycin IV due to spread - acetaminophen 566m q6hrs  - follow blood cultures - zofran prn  - oxycodone 517mq4hrs prn (4 doses within last 24 hours) - miralax prn  - wound consult --> iodoform packing daily - prn ativan 1 hr prior to wound packing - Given anxiety surrounding wound dressings, [facility] upon [facility].   Chronic Leukocytosis Evidenced back to 2014. Appears leukocytosis of 12.4 is likely baseline and also likely reactive. No record of bone marrow biopsy in the past.  Dysfunctional uterine bleeding Patient takes megace as needed for dysfunctional uterine bleeding. States she has not needed to take megace in 1 week. Patient is not actively bleeding now.   FEN/GI: regular  diet Prophylaxis: lovenox  Disposition: continue inpatient admission for R groin cellulitis, requiring IV antibiotics still  Subjective:  Patient with tender redness lateral to initial demarcated area. She has noticed increased firmness but no distal spread. She reports decent appetite. Pain tolerable. She is very anxious to improve.   Objective: Temp:  [98.4 F (36.9 C)-98.6 F (37 C)] 98.6 F (37 C) (11/03 0439) Pulse Rate:  [64-70] 64 (11/03 0439) Resp:  [16] 16 (11/03 0439) BP: (101-123)/(53-67) 101/53 (11/03 0439) SpO2:  [98 %-99 %] 98 % (11/03 0439)  Physical Exam: General: obese female, very pleasant Cardiovascular: RRR, no murmurs/rubs/gallops Respiratory: CTA bilaterally, no wheezes or crackles Abdomen: soft, non tender to palpation Extremities: R groin erythematous margins receded proximal to borders drawn in Urgent Care but now extending laterally; remains firm and extremely tender to light palpation. Wound actively draining.     Laboratory:  Recent Labs Lab 09/16/17 0518 09/17/17 0426 09/18/17 0500  WBC 13.6* 12.4* 10.8*  HGB 13.0 12.8 13.0  HCT 37.9 39.1 39.2  PLT 358 444* 458*    Recent Labs Lab 09/15/17 0940 09/16/17 0518 09/18/17 0500  NA 133* 132* 137  K 3.9 3.8 4.0  CL 100* 105 109  CO2 21* 17* 21*  BUN '7 6 8  ' CREATININE 1.05* 0.74 0.63  CALCIUM 9.4 8.3* 8.7*  GLUCOSE 125* 84 89   A1c - 5.6  Imaging/Diagnostic Tests:  CT Pelvis w/ contrast FINDINGS: Urinary Tract: The bladder is unremarkable. It is mildly distended. No bladder lesion or bladder calculi. No distal ureteral calculi. Bowel: The visualized small bowel and colon are unremarkable. The appendix is normal. Vascular/Lymphatic: Mildly enlarged/  inflamed right external iliac lymph nodes and right inguinal lymph nodes. The major vascular structures are normal. Reproductive:  Normal uterus and ovaries. Other:  No intrapelvic inflammatory process/abscess. Musculoskeletal:  There is an open wound involving the the lower right groin area with air in the subcutaneous tissues. No discrete drainable soft tissue abscess is identified. Diffuse skin thickening and subcutaneous edema consistent with cellulitis. No findings to suggest myofasciitis or pyomyositis. Associated inflamed/enlarged/hyperplastic right inguinal lymph nodes and right external iliac lymph nodes. The bony structures are unremarkable. No findings to suggest septic arthritis or osteomyelitis. IMPRESSION: 1. CT findings consistent with focus of significant cellulitis involving the medial right thigh and inguinal area but no discrete drainable soft tissue abscess, myofasciitis, pyomyositis, septic arthritis or osteomyelitis. 2. No significant intrapelvic abnormalities.  Rogue Bussing, MD 09/18/2017, 10:20 AM PGY-3, [place] Intern pager: [phone], text pages welcome

## 2017-09-18 NOTE — Progress Notes (Signed)
Pharmacy Antibiotic Note  Rebecca Flynn is a 36 y.o. female admitted on 09/15/2017 with cellulitis.  Patient here with fevers and nausea and failed outpatient bactrim. CT without drainable abscess.   Patient has been on vancomycin, which was narrowed to PO doxycycline yesterday.  Pharmacy received consult today to resume vancomycin.  She remains afebrile and her WBC is improving.  Renal function is also improving.   Plan: Vanc 1gm IV Q8H Monitor renal fxn, clinical progress, vanc trough at [facility]   Weight: 242 lb (109.8 kg)  Temp (24hrs), Avg:98.5 F (36.9 C), Min:98.4 F (36.9 C), Max:98.6 F (37 C)   Recent Labs Lab 09/15/17 0940 09/16/17 0518 09/17/17 0426 09/18/17 0500  WBC 21.6* 13.6* 12.4* 10.8*  CREATININE 1.05* 0.74  --  0.63    Estimated Creatinine Clearance: 119.7 mL/min (by C-G formula based on SCr of 0.63 mg/dL).    Allergies  Allergen Reactions  . Penicillins Nausea And Vomiting and Rash    Vanc 10/31 >> 11/2, resume 11/3 >> CTX x1 10/31 Bactrim PTA Doxy 11/2 >> 11/3  10/31 BCx - NGTD   Rebecca Flynn D. Laney Potashang, PharmD, BCPS Pager:  [phone] 09/18/2017, 10:20 AM

## 2017-09-19 ENCOUNTER — Other Ambulatory Visit: Payer: Self-pay

## 2017-09-19 LAB — BASIC METABOLIC PANEL
ANION GAP: 8 (ref 5–15)
BUN: 9 mg/dL (ref 6–20)
CHLORIDE: 108 mmol/L (ref 101–111)
CO2: 21 mmol/L — AB (ref 22–32)
Calcium: 8.7 mg/dL — ABNORMAL LOW (ref 8.9–10.3)
Creatinine, Ser: 0.69 mg/dL (ref 0.44–1.00)
GFR calc Af Amer: >60 mL/min (ref >60)
GFR calc non Af Amer: >60 mL/min (ref >60)
GLUCOSE: 90 mg/dL (ref 65–99)
POTASSIUM: 4.1 mmol/L (ref 3.5–5.1)
Sodium: 137 mmol/L (ref 135–145)

## 2017-09-19 LAB — CBC
HEMATOCRIT: 40.2 % (ref 36.0–46.0)
Hemoglobin: 13.3 g/dL (ref 12.0–15.0)
MCH: 29 pg (ref 26.0–34.0)
MCHC: 33.1 g/dL (ref 30.0–36.0)
MCV: 87.8 fL (ref 78.0–100.0)
PLATELETS: 511 10*3/uL — AB (ref 150–400)
RBC: 4.58 MIL/uL (ref 3.87–5.11)
RDW: 13.3 % (ref 11.5–15.5)
WBC: 9.5 10*3/uL (ref 4.0–10.5)

## 2017-09-19 MED ORDER — LORAZEPAM 1 MG PO TABS: 1.0000 mg | ORAL_TABLET | Freq: Every day | ORAL | Status: DC | PRN

## 2017-09-19 MED ORDER — MORPHINE SULFATE (PF) 4 MG/ML IV SOLN
1.0000 mg | Freq: Every day | INTRAVENOUS | Status: DC | PRN
  Administered 2017-09-19 – 2017-09-20 (×2): 1 mg via INTRAVENOUS
  Filled 2017-09-19 (×2): qty 1

## 2017-09-19 MED ORDER — MORPHINE BOLUS VIA INFUSION: 1.0000 mg | INTRAVENOUS | Status: DC | PRN

## 2017-09-19 NOTE — Progress Notes (Signed)
Patient refused dressing change for this morning.  Last dressing change was yesterday around 5pm.

## 2017-09-19 NOTE — Progress Notes (Signed)
Family Medicine Teaching Service Daily Progress Note Intern Pager: [phone]  Patient name: Rebecca Flynn Medical record number: [id number] Date of birth: 07/18/1981 Age: 36 y.o. Gender: female  Primary Care Provider: Patient, No Pcp Per Consultants: None Code Status: Full  Pt Overview and Major Events to Date:  10/31 - admitted for R groin abscess, cellulitis 11/2 - transition to PO antibiotics (doxy) 11/3 - restart IV vancomycin  Assessment and Plan: Rebecca Flynn is a 36 y.o. female presenting with right groin abscess and concern for cellulitis. PMH is significant for obesity and dysfunctional uterine bleeding.   Right medial thigh/inguinal cellulitis Patient states symptoms began with small abscess in inguinal fold on 10/25 at which time she received bactrim (10/26) and cipro (10/29) with little relief and spread of infection to thigh. CT on admission confirming cellulitis with no drainable abscess noted. Leukocytosis of 21 > 9.5, and patient with chronic leukocytosis. VSS. Remains afebrile. NGTD of blood cultures. Margins have receded since restarting IV Vancomycin. - vitals qshift - continue vancomycin IV  - ID consult - acetaminophen 578m q6hrs  - zofran prn  - oxycodone 540mq4hrs prn (5 doses within last 24 hours) - miralax prn  - wound consult --> iodoform packing daily - prn ativan and morphine 30 mins prior to wound packing - Given anxiety surrounding wound dressings, [facility] upon [facility].   Chronic Leukocytosis Evidenced back to 2014. Appears leukocytosis of 12.4 is likely baseline and also likely reactive. Down to 9.5 this am 11/4. No record of bone marrow biopsy in the past.  Dysfunctional uterine bleeding Patient takes megace as needed for dysfunctional uterine bleeding. States she has not needed to take megace in 1 week. Patient is not actively bleeding now.   FEN/GI: regular  diet Prophylaxis: lovenox  Disposition: continue inpatient admission for R groin cellulitis, requiring IV antibiotics still  Subjective:  Patient feels pain is well controlled but has significant anxiety regarding wound packing and requests something stronger.  Objective: Temp:  [97.7 F (36.5 C)-98.7 F (37.1 C)] 97.7 F (36.5 C) (11/04 0540) Pulse Rate:  [60-65] 65 (11/04 0540) Resp:  [14-17] 17 (11/04 0540) BP: (105-132)/(62-73) 132/72 (11/04 0540) SpO2:  [99 %-100 %] 99 % (11/04 0540)  Physical Exam: General: obese female, very pleasant Cardiovascular: RRR, no murmurs/rubs/gallops Respiratory: CTA bilaterally, no wheezes or crackles Abdomen: soft, non tender to palpation Extremities: R groin erythematous margins receded proximal to borders drawn in Urgent Care as well as extended lateral margins noted yesterday; remains firm and extremely tender to light palpation. Wound packed and dressed.  Laboratory: Recent Labs  Lab 09/17/17 0426 09/18/17 0500 09/19/17 0522  WBC 12.4* 10.8* 9.5  HGB 12.8 13.0 13.3  HCT 39.1 39.2 40.2  PLT 444* 458* 511*   Recent Labs  Lab 09/15/17 0940 09/16/17 0518 09/18/17 0500  NA 133* 132* 137  K 3.9 3.8 4.0  CL 100* 105 109  CO2 21* 17* 21*  BUN '7 6 8  ' CREATININE 1.05* 0.74 0.63  CALCIUM 9.4 8.3* 8.7*  GLUCOSE 125* 84 89   A1c - 5.6  Imaging/Diagnostic Tests:  CT Pelvis w/ contrast FINDINGS: Urinary Tract: The bladder is unremarkable. It is mildly distended. No bladder lesion or bladder calculi. No distal ureteral calculi. Bowel: The visualized small bowel and colon are unremarkable. The appendix is normal. Vascular/Lymphatic: Mildly enlarged/ inflamed right external iliac lymph nodes and right inguinal lymph nodes. The major  vascular structures are normal. Reproductive:  Normal uterus and ovaries. Other:  No intrapelvic inflammatory process/abscess. Musculoskeletal: There is an open wound involving the the lower right  groin area with air in the subcutaneous tissues. No discrete drainable soft tissue abscess is identified. Diffuse skin thickening and subcutaneous edema consistent with cellulitis. No findings to suggest myofasciitis or pyomyositis. Associated inflamed/enlarged/hyperplastic right inguinal lymph nodes and right external iliac lymph nodes. The bony structures are unremarkable. No findings to suggest septic arthritis or osteomyelitis. IMPRESSION: 1. CT findings consistent with focus of significant cellulitis involving the medial right thigh and inguinal area but no discrete drainable soft tissue abscess, myofasciitis, pyomyositis, septic arthritis or osteomyelitis. 2. No significant intrapelvic abnormalities.  Rory Percy, DO 09/19/2017, 6:39 AM PGY-1, [place] Intern pager: [phone], text pages welcome

## 2017-09-19 NOTE — Progress Notes (Signed)
Spoke with Dr. Drue SecondSnider with Infectious Disease regarding patient's regression on PO antibiotics. She states it's most likely strep in etiology based on limited response to Bactrim and Doxycycline and some response from Ciprofloxacin. Recommends Clindamycin or Keflex if patient's penicillin allergy is not anaphylactic. Will continue current course of treatment with IV Vancomycin and switch to PO Clindamycin in the morning.   Ellwood DenseAlison Rumball, DO PGY-1, [place] [facility] 09/19/2017 4:00 PM

## 2017-09-20 LAB — CULTURE, BLOOD (ROUTINE X 2)
CULTURE: NO GROWTH
Culture: NO GROWTH
SPECIAL REQUESTS: ADEQUATE
Special Requests: ADEQUATE

## 2017-09-20 MED ORDER — CLINDAMYCIN HCL 300 MG PO CAPS
300.0000 mg | ORAL_CAPSULE | Freq: Four times a day (QID) | ORAL | Status: DC
  Administered 2017-09-20 – 2017-09-21 (×5): 300 mg via ORAL
  Filled 2017-09-20 (×5): qty 1

## 2017-09-20 NOTE — Care Management Note (Signed)
Case Management Note  Patient Details  Name: Rebecca ChurnCindy R Lapage MRN: [id number] Date of Birth: 09/11/1981  Subjective/Objective:   [age]yr old female admitted with a right inguinal cellulitis.                Action/Plan: Case manager spoke with patient concerning discharge plan. Choice for Home Health Agency was offered. Referral was called to Shaune LeeksJermaine Jenkins, [facility] liaison. Case Manager discussed with patient need to have trainable family available to learn wound care. CM also spoke with Family medicine resident who says should patient need assistance with wound care she can call the clinic for an appointment. Patient was very appreciative of Case manager assistance.     Expected Discharge Date:   09/21/17               Expected Discharge Plan:  Home w Home Health Services  In-House Referral:     Discharge planning Services  CM Consult  Post Acute Care Choice:  Home Health Choice offered to:  Patient  DME Arranged:  N/A DME Agency:     HH Arranged:  RN HH Agency:  [facility]  Status of Service:  Completed, signed off  If discussed at Long Length of Stay Meetings, dates discussed:    Additional Comments:  Durenda GuthrieBrady, Shatera Rennert Naomi, RN 09/20/2017, 4:03 PM

## 2017-09-20 NOTE — Progress Notes (Signed)
Family Medicine Teaching Service Daily Progress Note Intern Pager: [phone]  Patient name: Rebecca Flynn Medical record number: [id number] Date of birth: [birth year] Age: 36 y.o. Gender: female  Primary Care Provider: Patient, No Pcp Per Consultants: None Code Status: Full  Pt Overview and Major Events to Date:  10/31 - admitted for R groin abscess, cellulitis 11/2 - transition to PO antibiotics (doxy) 11/3 - restart IV vancomycin  Assessment and Plan: Rebecca Flynn is a 36 y.o. female presenting with right groin abscess and concern for cellulitis. PMH is significant for obesity and dysfunctional uterine bleeding.   Right medial thigh/inguinal cellulitis Patient states symptoms began with small abscess in inguinal fold on 10/25 at which time she received bactrim (10/26) and cipro (10/29) with little relief and spread of infection to thigh. CT on admission confirming cellulitis with no drainable abscess noted. Leukocytosis of 21 > 9.5, and patient with chronic leukocytosis. VSS. Remains afebrile. NGTD of blood cultures. Margins have receded since restarting IV Vancomycin. Spoke with ID yesterday who stated infection is likely strep and recommend Keflex if PCN allergy is not severe, or clindamycin. Will transition to PO abx today and watch overnight, can likely go home tomorrow if does well. - vitals qshift - transition to PO Clindamycin today  - acetaminophen 520m q6hrs  - zofran prn  - oxycodone 550mq4hrs prn (5 doses within last 24 hours) - miralax prn  - wound consult --> iodoform packing daily - prn ativan and morphine 30 mins prior to wound packing - Given anxiety surrounding wound dressings, [facility] upon [facility].   Chronic Leukocytosis Evidenced back to 2014. Appears leukocytosis of 12.4 is likely baseline and also likely reactive. Down to 9.5 11/4. No record of bone marrow biopsy in the  past.  Dysfunctional uterine bleeding Patient takes megace as needed for dysfunctional uterine bleeding. States she has not needed to take megace in 1 week. Patient is not actively bleeding now.   FEN/GI: regular diet Prophylaxis: lovenox  Disposition: continue inpatient admission for R groin cellulitis, likely home tomorrow  Subjective:  Patient doing well, states pain is controlled. Less anxiety with wound packing with morphine and ativan.  Objective: Temp:  [97.6 F (36.4 C)-98.4 F (36.9 C)] 98.4 F (36.9 C) (11/05 0700) Pulse Rate:  [53-66] 53 (11/05 0700) Resp:  [15-16] 15 (11/04 1918) BP: (109-130)/(60-70) 109/60 (11/05 0700) SpO2:  [94 %-100 %] 100 % (11/05 0700)  Physical Exam: General: obese female, very pleasant Cardiovascular: RRR, no murmurs/rubs/gallops Respiratory: CTA bilaterally, no wheezes or crackles Abdomen: soft, non tender to palpation Extremities: R groin erythematous margins receded beneath current wound dressing; remains slightly firm and still tender to palpation although improving. Wound packed and dressed.  Laboratory: Recent Labs  Lab 09/17/17 0426 09/18/17 0500 09/19/17 0522  WBC 12.4* 10.8* 9.5  HGB 12.8 13.0 13.3  HCT 39.1 39.2 40.2  PLT 444* 458* 511*   Recent Labs  Lab 09/16/17 0518 09/18/17 0500 09/19/17 0522  NA 132* 137 137  K 3.8 4.0 4.1  CL 105 109 108  CO2 17* 21* 21*  BUN '6 8 9  ' CREATININE 0.74 0.63 0.69  CALCIUM 8.3* 8.7* 8.7*  GLUCOSE 84 89 90   A1c - 5.6  Imaging/Diagnostic Tests:  CT Pelvis w/ contrast FINDINGS: Urinary Tract: The bladder is unremarkable. It is mildly distended. No bladder lesion or bladder calculi. No distal ureteral calculi. Bowel: The visualized small bowel  and colon are unremarkable. The appendix is normal. Vascular/Lymphatic: Mildly enlarged/ inflamed right external iliac lymph nodes and right inguinal lymph nodes. The major vascular structures are normal. Reproductive:  Normal  uterus and ovaries. Other:  No intrapelvic inflammatory process/abscess. Musculoskeletal: There is an open wound involving the the lower right groin area with air in the subcutaneous tissues. No discrete drainable soft tissue abscess is identified. Diffuse skin thickening and subcutaneous edema consistent with cellulitis. No findings to suggest myofasciitis or pyomyositis. Associated inflamed/enlarged/hyperplastic right inguinal lymph nodes and right external iliac lymph nodes. The bony structures are unremarkable. No findings to suggest septic arthritis or osteomyelitis. IMPRESSION: 1. CT findings consistent with focus of significant cellulitis involving the medial right thigh and inguinal area but no discrete drainable soft tissue abscess, myofasciitis, pyomyositis, septic arthritis or osteomyelitis. 2. No significant intrapelvic abnormalities.  Rory Percy, DO 09/20/2017, 9:37 AM PGY-1, [place] Intern pager: [phone], text pages welcome

## 2017-09-21 LAB — COMPREHENSIVE METABOLIC PANEL
ALK PHOS: 83 U/L (ref 38–126)
ALT: 94 U/L — ABNORMAL HIGH (ref 14–54)
ANION GAP: 8 (ref 5–15)
AST: 88 U/L — ABNORMAL HIGH (ref 15–41)
Albumin: 3.1 g/dL — ABNORMAL LOW (ref 3.5–5.0)
BILIRUBIN TOTAL: 0.7 mg/dL (ref 0.3–1.2)
BUN: 10 mg/dL (ref 6–20)
CALCIUM: 9.2 mg/dL (ref 8.9–10.3)
CO2: 21 mmol/L — ABNORMAL LOW (ref 22–32)
Chloride: 109 mmol/L (ref 101–111)
Creatinine, Ser: 0.82 mg/dL (ref 0.44–1.00)
GLUCOSE: 91 mg/dL (ref 65–99)
Potassium: 4.3 mmol/L (ref 3.5–5.1)
Sodium: 138 mmol/L (ref 135–145)
TOTAL PROTEIN: 6.6 g/dL (ref 6.5–8.1)

## 2017-09-21 LAB — CBC
HEMATOCRIT: 42.2 % (ref 36.0–46.0)
HEMOGLOBIN: 13.9 g/dL (ref 12.0–15.0)
MCH: 28.8 pg (ref 26.0–34.0)
MCHC: 32.9 g/dL (ref 30.0–36.0)
MCV: 87.6 fL (ref 78.0–100.0)
Platelets: 517 10*3/uL — ABNORMAL HIGH (ref 150–400)
RBC: 4.82 MIL/uL (ref 3.87–5.11)
RDW: 13.6 % (ref 11.5–15.5)
WBC: 11.6 10*3/uL — ABNORMAL HIGH (ref 4.0–10.5)

## 2017-09-21 MED ORDER — CLINDAMYCIN HCL 300 MG PO CAPS: 300.0000 mg | ORAL_CAPSULE | Freq: Four times a day (QID) | ORAL | 0 refills | Status: AC

## 2017-09-21 MED ORDER — ENOXAPARIN SODIUM 60 MG/0.6ML ~~LOC~~ SOLN: 55.0000 mg | SUBCUTANEOUS | Status: DC

## 2017-09-21 NOTE — Evaluation (Signed)
Physical Therapy Evaluation and Discharge Patient Details Name: Rebecca Flynn MRN: [id number] DOB: 12/16/1980 Today's Date: 09/21/2017   History of Present Illness  Pt is a 36 y/o female who presents with abscess of R groin wtih concern for cellulitis. No significant PMH for PT noted.   Clinical Impression  Patient evaluated by Physical Therapy with no further acute PT needs identified. All education has been completed and the patient has no further questions. At the time of PT eval pt was able to perform transfers and ambulation with gross modified independence, and stair training with supervision for safety. With RW, pt was able to mobilize well with improved posture. Pt somewhat resistant to AD use, however by end of session pt agreed it was necessary at this time. CM updated to DME needs. Verbally reviewed HEP. See below for any follow-up Physical Therapy or equipment needs. PT is signing off. Thank you for this referral.     Follow Up Recommendations No PT follow up;Supervision for mobility/OOB    Equipment Recommendations  Rolling walker with 5" wheels;3in1 (PT)    Recommendations for Other Services       Precautions / Restrictions Precautions Precautions: None Restrictions Weight Bearing Restrictions: No      Mobility  Bed Mobility Overal bed mobility: Modified Independent             General bed mobility comments: Pt was able to transition to/from EOB without assistance.   Transfers Overall transfer level: Modified independent Equipment used: Rolling walker (2 wheeled) Transfers: Sit to/from Stand           General transfer comment: No assist required. Pt demonstrated proper hand placement on seated surface for safety.   Ambulation/Gait Ambulation/Gait assistance: Modified independent (Device/Increase time) Ambulation Distance (Feet): 400 Feet Assistive device: Rolling walker (2 wheeled) Gait Pattern/deviations: Step-through pattern;Decreased stride  length;Trunk flexed Gait velocity: Decreased due to pain Gait velocity interpretation: Below normal speed for age/gender General Gait Details: VC's for improved posture and general safety with RW use. Pt grossly flexed however was able to make corrective changes with VC's. No LOB noted and no assist required with RW.   Stairs Stairs: Yes Stairs assistance: Supervision Stair Management: Two rails;One rail Right;Step to pattern;Sideways;Forwards Number of Stairs: 7 General stair comments: Pt was able to negotiate 5 stairs facing forward, and then practiced 2 more sideways for technique. Pt was able to complete stair training well with light supervision for safety.   Wheelchair Mobility    Modified Rankin (Stroke Patients Only)       Balance Overall balance assessment: Needs assistance Sitting-balance support: Feet supported;No upper extremity supported Sitting balance-Leahy Scale: Normal     Standing balance support: No upper extremity supported;During functional activity Standing balance-Leahy Scale: Fair Standing balance comment: Appears very antalgic and flexed without UE support however did not require assistance.                              Pertinent Vitals/Pain Pain Assessment: 0-10 Pain Score: 3  Pain Location: R groin Pain Descriptors / Indicators: Cramping Pain Intervention(s): Monitored during session    Home Living Family/patient expects to be discharged to:: Private residence Living Arrangements: Alone Available Help at Discharge: Family;Available 24 hours/day Type of Home: Apartment Home Access: Level entry     Home Layout: Two level;Able to live on main level with bedroom/bathroom Home Equipment: None      Prior Function Level of Independence: Independent  Comments: Works full time from home - [employer]     Hand Dominance   Dominant Hand: Right    Extremity/Trunk Assessment   Upper Extremity Assessment Upper Extremity  Assessment: Overall WFL for tasks assessed    Lower Extremity Assessment Lower Extremity Assessment: RLE deficits/detail RLE Deficits / Details: Decreased strength and AROM consistent with above mentioned diagnosis.  RLE: Unable to fully assess due to pain    Cervical / Trunk Assessment Cervical / Trunk Assessment: Normal  Communication   Communication: No difficulties  Cognition Arousal/Alertness: Awake/alert Behavior During Therapy: WFL for tasks assessed/performed Overall Cognitive Status: Within Functional Limits for tasks assessed                                        General Comments      Exercises     Assessment/Plan    PT Assessment Patent does not need any further PT services  PT Problem List         PT Treatment Interventions      PT Goals (Current goals can be found in the Care Plan section)  Acute Rehab PT Goals Patient Stated Goal: Be independent PT Goal Formulation: All assessment and education complete, DC therapy    Frequency     Barriers to discharge        Co-evaluation               AM-PAC PT "6 Clicks" Daily Activity  Outcome Measure Difficulty turning over in bed (including adjusting bedclothes, sheets and blankets)?: None Difficulty moving from lying on back to sitting on the side of the bed? : None Difficulty sitting down on and standing up from a chair with arms (e.g., wheelchair, bedside commode, etc,.)?: None Help needed moving to and from a bed to chair (including a wheelchair)?: None Help needed walking in hospital room?: None Help needed climbing 3-5 steps with a railing? : A Little 6 Click Score: 23    End of Session Equipment Utilized During Treatment: Gait belt Activity Tolerance: Patient tolerated treatment well Patient left: in bed;with call bell/phone within reach Nurse Communication: Mobility status PT Visit Diagnosis: Pain Pain - Right/Left: Right Pain - part of body: Leg    Time:  0932-1005 PT Time Calculation (min) (ACUTE ONLY): 33 min   Charges:   PT Evaluation $PT Eval Low Complexity: 1 Low PT Treatments $Gait Training: 8-22 mins   PT G Codes:        Rebecca Flynn, PT, DPT Acute Rehabilitation Services Pager: [phone]   Rebecca Flynn 09/21/2017, 10:59 AM

## 2017-09-21 NOTE — Discharge Instructions (Signed)
You were admitted for cellulitis, or skin infection of your R groin. You were treated with IV antibiotics for most of your hospitalization with transition to antibiotics by mouth with Clindamycin and did well on it prior to discharge.  [facility] other [facility].   Your first appointment will be 09/22/17 at 9:00am  If your wound worsens or you begin developing fever, you should be reevaluated.

## 2017-09-21 NOTE — Progress Notes (Signed)
Family Medicine Teaching Service Daily Progress Note Intern Pager: [phone]  Patient name: Rebecca Flynn Medical record number: [id number] Date of birth: 1981-07-19 Age: 36 y.o. Gender: female  Primary Care Provider: Patient, No Pcp Per Consultants: None Code Status: Full  Pt Overview and Major Events to Date:  10/31 - admitted for R groin abscess, cellulitis 11/2 - transition to PO antibiotics (doxy) 11/3 - restart IV vancomycin 11/5 - transition to PO clindamycin  Assessment and Plan: Rebecca Flynn is a 36 y.o. female presenting with right groin abscess and concern for cellulitis. PMH is significant for obesity and dysfunctional uterine bleeding.   Right medial thigh/inguinal cellulitis Patient states symptoms began with small abscess in inguinal fold on 10/25 at which time she received bactrim (10/26) and cipro (10/29) with little relief and spread of infection to thigh. CT on admission confirming cellulitis with no drainable abscess noted. Leukocytosis of 21 > 11.6, and patient with chronic leukocytosis. VSS. Remains afebrile. NGTD of blood cultures. Margins have receded on IV Vancomycin, failed transition to Doxycycline. In consult with ID, transitioned to PO Clindamycin yesterday. Patient has continued to do well on Clindamycin with no expansion of wound margins, will likely d/c today with clinic f/u for dressing changes per patient preference. - vitals qshift - acetaminophen 530m q6hrs  - zofran prn  - oxycodone 549mq4hrs prn (5 doses within last 24 hours) - miralax prn  - wound consult --> iodoform packing daily - prn ativan and morphine 30 mins prior to wound packing - Given anxiety surrounding wound dressings, [facility].   Chronic Leukocytosis Evidenced back to 2014. Appears leukocytosis of 12.4 is likely baseline and also likely reactive. Down to 11.6 11/6. No record of bone marrow biopsy  in the past.  Dysfunctional uterine bleeding Patient takes megace as needed for dysfunctional uterine bleeding. States she has not needed to take megace in 1 week. Patient is not actively bleeding now.   FEN/GI: regular diet Prophylaxis: lovenox  Disposition: likely home today  Subjective:  Patient feeling well today and wants to go home.  Objective: Temp:  [98.3 F (36.8 C)-98.6 F (37 C)] 98.6 F (37 C) (11/06 0544) Pulse Rate:  [53-66] 66 (11/06 0544) Resp:  [15-17] 17 (11/06 0544) BP: (109-122)/(59-79) 116/59 (11/06 0544) SpO2:  [99 %-100 %] 99 % (11/06 0544)  Physical Exam: General: obese female, very pleasant Cardiovascular: RRR, no murmurs/rubs/gallops Respiratory: CTA bilaterally, no wheezes or crackles Abdomen: soft, non tender to palpation Extremities: R groin erythematous margins receded beneath current wound dressing; remains slightly firm and still tender to palpation although improving. Wound packed and dressed.  Laboratory: Recent Labs  Lab 09/17/17 0426 09/18/17 0500 09/19/17 0522  WBC 12.4* 10.8* 9.5  HGB 12.8 13.0 13.3  HCT 39.1 39.2 40.2  PLT 444* 458* 511*   Recent Labs  Lab 09/16/17 0518 09/18/17 0500 09/19/17 0522  NA 132* 137 137  K 3.8 4.0 4.1  CL 105 109 108  CO2 17* 21* 21*  BUN '6 8 9  ' CREATININE 0.74 0.63 0.69  CALCIUM 8.3* 8.7* 8.7*  GLUCOSE 84 89 90   A1c - 5.6  Imaging/Diagnostic Tests:  CT Pelvis w/ contrast FINDINGS: Urinary Tract: The bladder is unremarkable. It is mildly distended. No bladder lesion or bladder calculi. No distal ureteral calculi. Bowel: The visualized small bowel and colon are unremarkable. The appendix is normal. Vascular/Lymphatic: Mildly enlarged/ inflamed right external  iliac lymph nodes and right inguinal lymph nodes. The major vascular structures are normal. Reproductive:  Normal uterus and ovaries. Other:  No intrapelvic inflammatory process/abscess. Musculoskeletal: There is an open  wound involving the the lower right groin area with air in the subcutaneous tissues. No discrete drainable soft tissue abscess is identified. Diffuse skin thickening and subcutaneous edema consistent with cellulitis. No findings to suggest myofasciitis or pyomyositis. Associated inflamed/enlarged/hyperplastic right inguinal lymph nodes and right external iliac lymph nodes. The bony structures are unremarkable. No findings to suggest septic arthritis or osteomyelitis. IMPRESSION: 1. CT findings consistent with focus of significant cellulitis involving the medial right thigh and inguinal area but no discrete drainable soft tissue abscess, myofasciitis, pyomyositis, septic arthritis or osteomyelitis. 2. No significant intrapelvic abnormalities.  Rory Percy, DO 09/21/2017, 6:27 AM PGY-1, [place] Intern pager: [phone], text pages welcome

## 2017-09-21 NOTE — Care Management Note (Signed)
Case Management Note  Patient Details  Name: Kelli ChurnCindy R Vasconez MRN: [id number] Date of Birth: 10/01/1981  Subjective/Objective:                    Action/Plan:   Expected Discharge Date:    09/21/17              Expected Discharge Plan:  Home w Home Health Services  In-House Referral:     Discharge planning Services  CM Consult  Post Acute Care Choice:  Home Health Choice offered to:  Patient  DME Arranged:  3-N-1, Walker rolling DME Agency:  [facility]  [facility]  Status of Service:  Completed, signed off  If discussed at Long Length of Stay Meetings, dates discussed:    Additional Comments:  Durenda GuthrieBrady, Pattiann Solanki Naomi, RN 09/21/2017, 10:52 AM

## 2017-09-22 ENCOUNTER — Ambulatory Visit (INDEPENDENT_AMBULATORY_CARE_PROVIDER_SITE_OTHER): Payer: 59 | Admitting: *Deleted

## 2017-09-22 DIAGNOSIS — L03115 Cellulitis of right lower limb: Secondary | ICD-10-CM

## 2017-09-22 MED ORDER — TRAMADOL HCL 50 MG PO TABS: 50.0000 mg | ORAL_TABLET | Freq: Three times a day (TID) | ORAL | 0 refills | Status: DC | PRN

## 2017-09-22 NOTE — Progress Notes (Signed)
Patient here today for "dressing change" s/p hospital discharge for groin abscess with iodoform packing.  Was discharged yesterday and scheduled to have packing changed every other day.  Discharge note states patient was set up with [facility] for dressing changes at home.  Patient states she is not "comfortable" with this because [facility] was planning to teach a family member how to do the packing and she is very concerned with infection.    Spoke with Dr. Gwendolyn GrantWalden (preceptor) - Assessed site and will not change packing until tomorrow.  Changed ABD pad and gave patient hypofix tape in case needs extra tape.    Appt scheduled with Dr. Jonathon JordanGambino for 09/23/17 at 4:10 pm.  Patient states she did not get her tramadol Rx yesterday.  Patient given printed Rx to fill and informed to take tramadol 2 tablets one hour prior to appt tomorrow.    Altamese Dilling~Jeannette Richardson, BSN, RN-BC

## 2017-09-23 ENCOUNTER — Ambulatory Visit (INDEPENDENT_AMBULATORY_CARE_PROVIDER_SITE_OTHER): Payer: 59 | Admitting: Family Medicine

## 2017-09-23 ENCOUNTER — Encounter: Payer: Self-pay | Admitting: Family Medicine

## 2017-09-23 DIAGNOSIS — L03115 Cellulitis of right lower limb: Secondary | ICD-10-CM | POA: Diagnosis not present

## 2017-09-23 NOTE — Progress Notes (Signed)
Subjective:    Patient ID: Rebecca ChurnCindy R Rapley , female   DOB: 10/21/1981 , 36 y.o..   MRN: [id number]  HPI  Rebecca Flynn is a 36 year old female with past medical history of obesity and dysfunctional uterine bleeding here for  Chief Complaint  Patient presents with  . HFU    1. Follow up outpatient visit after Hospitalization  Werner LeanCindy R Salguero is alone for ger visit Sources of clinical information for visit is/are patient. The Discharge Summary for the hospitalization from September 15, 2017 to September 21, 2017 was reviewed.   HPI Principle Diagnosis requiring hospitalization: Sepsis secondary to right groin cellulitis Brief Hospital course summary: Patient was admitted due to outpatient failure with Bactrim and ciprofloxacin for right groin abscess.  She received IV vancomycin and was transitioned to p.o. doxycycline, she failed this treatment and was placed back on IV vancomycin for an additional 2 days.  ID was consulted.  Once patient improves she was transitioned to oral clindamycin and then discharged with outpatient follow-up.  She has 2 open wounds on her right groin that were packed and treated with wound care in the hospital.  Follow up instructions from patient's hospital healthcare providers:  Keep area and right groin clean dry and intact.  Follow-up in outpatient setting for repacking of wound. ---------------------------------------------------------------------------------------------------------------------- Problems since hospital discharge: Patient notes that she feels great since leaving the hospital.  She notes that she has only had some mild discomfort in her right groin which she takes Tylenol for.  She was given a prescription for tramadol but she has only taken 1 pill today because she knew she was going to get her dressings changed and it would be painful.  She denies any fevers, chills, nausea, vomiting, diarrhea,  constipation.  ---------------------------------------------------------------------------------------------------------------------- Follow up appointments with specialists:  Pending: None Completed: None ---------------------------------------------------------------------------------------------------------------------- New medications started during hospitalization: Clindamycin Chronic medications stopped during hospitalization: Any prior antibiotics besides clindamycin Patient's Medication List was updated in the EMR: yes --------------------------------------------------------------------------------------------------------------------- Home Health Services: No Durable Medical Equipment: No ---------------------------------------------------------------------------------------------------------------------  Review of Systems: Per HPI.   Past Medical History: Patient Active Problem List   Diagnosis Date Noted  . Cellulitis of right thigh 09/15/2017  . Obesity 01/05/2014  . Smoker 01/05/2014  . Infertility 01/05/2014  . DUB (dysfunctional uterine bleeding) 12/09/2012    Medications: reviewed and updated Current Outpatient Medications  Medication Sig Dispense Refill  . acetaminophen (TYLENOL) 500 MG tablet Take 500 mg by mouth every 6 (six) hours as needed for moderate pain.    . clindamycin (CLEOCIN) 300 MG capsule Take 1 capsule (300 mg total) 4 (four) times daily for 8 days by mouth. 32 capsule 0  . megestrol (MEGACE) 40 MG tablet Take 1 tablet (40 mg total) by mouth daily. (Patient taking differently: Take 40 mg by mouth daily as needed (for menstral cycle). ) 30 tablet 11  . promethazine (PHENERGAN) 25 MG tablet Take 1 tablet (25 mg total) by mouth every 6 (six) hours as needed for nausea or vomiting. 15 tablet 0  . traMADol (ULTRAM) 50 MG tablet Take 1 tablet (50 mg total) every 8 (eight) hours as needed by mouth. 25 tablet 0  . ibuprofen (ADVIL,MOTRIN) 800 MG tablet Take  1 tablet (800 mg total) by mouth every 8 (eight) hours as needed for mild pain. (Patient not taking: Reported on 09/23/2017) 30 tablet 0   No current facility-administered medications for this visit.     Social Hx:  reports that she  has quit smoking. Her smoking use included cigarettes. She has a 8.50 pack-year smoking history. she has never used smokeless tobacco.   Objective:   BP 98/60   Pulse 78   Temp 98.3 F (36.8 C) (Oral)   Wt 240 lb (108.9 kg)   SpO2 98%   BMI 38.74 kg/m  Physical Exam  Gen: NAD, alert, cooperative with exam, well-appearing HEENT: NCAT, PERRL, clear conjunctiva, oropharynx clear, supple neck Cardiac: Regular rate and rhythm, normal S1/S2, no murmur, no edema, capillary refill brisk  Respiratory: Clear to auscultation bilaterally, no wheezes, non-labored breathing Gastrointestinal: soft, non tender, non distended, bowel sounds present Skin: Right groin showing 2 open wounds with no surrounding erythema or induration, nontender to palpation.  Small amount of pustular drainage noted. Neurological: no gross deficits.  Psych: good insight, normal mood and affect  Assessment & Plan:  Cellulitis of right thigh Improving.  Patient doing well since leaving the hospital.  She has been taking her clindamycin as prescribed.  Only needing Tylenol for pain and discomfort.  Two wound sites on her right groin were cleaned and repacked with 1/4 inch packing strips.  A small wick was left outside of the tunnel to allow for drainage.  An abdominal pad was placed over the groin area and taped. -Continue clindamycin to complete the 8-day course, last dose will be November 14 -Follow-up in 3 days for wound recheck and possible repacking -Patient has not yet established care at our visit and will need an appointment to do so in the future  Anders Simmondshristina Gambino, MD [facility], PGY-3

## 2017-09-23 NOTE — Assessment & Plan Note (Signed)
Improving.  Patient doing well since leaving the hospital.  She has been taking her clindamycin as prescribed.  Only needing Tylenol for pain and discomfort.  Two wound sites on her right groin were cleaned and repacked with 1/4 inch packing strips.  A small wick was left outside of the tunnel to allow for drainage.  An abdominal pad was placed over the groin area and taped. -Continue clindamycin to complete the 8-day course, last dose will be November 14 -Follow-up in 3 days for wound recheck and possible repacking -Patient has not yet established care at our visit and will need an appointment to do so in the future

## 2017-09-23 NOTE — Patient Instructions (Addendum)
Thank you for coming in today, it was so nice to see you! Today we talked about:    Hospital Follow up: You are doing great. Continue taking your antibiotics as prescribed. Take Tylenol as needed and Tramadol only for severe pain.   Continue taking your antibiotics as prescribed  Our fax number is [phone]  Please follow up on Monday to reevaluate your wound, it may need to be repacked.   If you have any questions or concerns, please do not hesitate to call the office at [phone]. You can also message me directly via MyChart.   Sincerely,  Anders Simmondshristina Spirit Wernli, MD

## 2017-09-27 ENCOUNTER — Ambulatory Visit (INDEPENDENT_AMBULATORY_CARE_PROVIDER_SITE_OTHER): Payer: 59 | Admitting: Family Medicine

## 2017-09-27 ENCOUNTER — Other Ambulatory Visit: Payer: Self-pay

## 2017-09-27 ENCOUNTER — Encounter: Payer: Self-pay | Admitting: Family Medicine

## 2017-09-27 DIAGNOSIS — L03115 Cellulitis of right lower limb: Secondary | ICD-10-CM

## 2017-09-27 MED ORDER — FLUCONAZOLE 150 MG PO TABS: 150.0000 mg | ORAL_TABLET | Freq: Once | ORAL | 0 refills | Status: AC

## 2017-09-27 NOTE — Patient Instructions (Signed)
Thank you for coming in today, it was so nice to see you! Today we talked about:    Groin wound: It is healing nicely! Continue doing what you're doing  I sent in a prescription for diflucan for you  Please follow up in 2 days for wound check. You can schedule this appointment at the front desk before you leave or call the clinic.  If you have any questions or concerns, please do not hesitate to call the office at [phone]. You can also message me directly via MyChart.   Sincerely,  Anders Simmondshristina Octavia Velador, MD

## 2017-09-27 NOTE — Progress Notes (Signed)
   Subjective:    Patient ID: Rebecca Flynn , female   DOB: 04/05/1981 , 36 y.o..   MRN: [id number]  HPI  Rebecca Flynn is here for  Chief Complaint  Patient presents with  . Wound Check    1. Right groin/thigh cellulitis and wound follow up: Patient states she has been doing well, is taking antibiotics as prescribed. Has 2 days left of Clindamycin. No fevers, chills. Notes that the wounds are not painful. She continues to cover them with an abdominal pad.   Review of Systems: Per HPI.   Past Medical History: Patient Active Problem List   Diagnosis Date Noted  . Cellulitis of right thigh 09/15/2017  . Obesity 01/05/2014  . Smoker 01/05/2014  . Infertility 01/05/2014  . DUB (dysfunctional uterine bleeding) 12/09/2012    Medications: reviewed   Social Hx:  reports that she has quit smoking. Her smoking use included cigarettes. She has a 8.50 pack-year smoking history. she has never used smokeless tobacco.   Objective:   BP 100/68   Pulse 86   Temp 98.1 F (36.7 C) (Oral)   Ht 5\' 6"  (1.676 m)   Wt 243 lb 9.6 oz (110.5 kg)   SpO2 98%   BMI 39.32 kg/m  Physical Exam  Gen: NAD, alert, cooperative with exam, well-appearing Skin: Right groin area showing 2 open wounds with no surrounding erythema or induration, nontender to palpation.  Small amount of pustular drainage noted.   Assessment & Plan:  Cellulitis of right thigh Continues to improve. She has been taking her Clindamycin as prescribed, has 2 days left to complete course. Not in discomfort. No systemic symptoms. Two wound site on right groin/leg were cleaned and repacked with 1/4 inch packing strips. A small wick was left outside of the tunnel to allow for drainage. An abdominal pad was placed over the groin area and taped - Continue Clindamycin for 2 more days to complete 8 day course - She endorses some vaginal candiasis, will send rx for diflucan - Follow up in 2 days for wound check/possible repacking - Will need  an appointment to establish care at our clinic  Meds ordered this encounter  Medications  . fluconazole (DIFLUCAN) 150 MG tablet    Sig: Take 1 tablet (150 mg total) once for 1 dose by mouth.    Dispense:  1 tablet    Refill:  0    Anders Simmondshristina Camela Wich, MD [facility], PGY-3

## 2017-09-27 NOTE — Assessment & Plan Note (Addendum)
Continues to improve. She has been taking her Clindamycin as prescribed, has 2 days left to complete course. Not in discomfort. No systemic symptoms. Two wound site on right groin/leg were cleaned and repacked with 1/4 inch packing strips. A small wick was left outside of the tunnel to allow for drainage. An abdominal pad was placed over the groin area and taped - Continue Clindamycin for 2 more days to complete 8 day course - She endorses some vaginal candiasis, will send rx for diflucan - Follow up in 2 days for wound check/possible repacking - Will need an appointment to establish care at our clinic

## 2017-09-30 ENCOUNTER — Other Ambulatory Visit: Payer: Self-pay

## 2017-09-30 ENCOUNTER — Encounter: Payer: Self-pay | Admitting: Student

## 2017-09-30 ENCOUNTER — Ambulatory Visit (INDEPENDENT_AMBULATORY_CARE_PROVIDER_SITE_OTHER): Payer: 59 | Admitting: Student

## 2017-09-30 VITALS — BP 112/78 | HR 79 | Temp 97.9°F | Ht 66.0 in | Wt 248.2 lb

## 2017-09-30 DIAGNOSIS — L03115 Cellulitis of right lower limb: Secondary | ICD-10-CM

## 2017-09-30 NOTE — Patient Instructions (Addendum)
It was great seeing you today! We have addressed the following issues today  Cellulitis/wound: We took out the packing and applied gauze dressing.  You may change this gauze dressing in 2-3 days or sooner if you notice excessive discharge.  Otherwise, I recommend keeping the area clean and dry.  You may return to clinic in 1 week or sooner if you have excessive discharge, increased pain, redness, fever or other symptoms concerning to you.  I also recommend losing weight to reduce recurrence.    If we did any lab work today, and the results require attention, either me or my nurse will get in touch with you. If everything is normal, you will get a letter in mail and a message via . If you don't hear from us in two weeks, please give us a call. Otherwise, we look forward to seeing you again at your next visit. If you have any questions or concerns before then, please call the clinic at [phone].  Please bring all your medications to every doctors visit  Sign up for My Chart to have easy access to your labs results, and communication with your Primary care physician.    Please check-out at the front desk before leaving the clinic.    Take Care,   Dr. Alanda SlimGonfa

## 2017-09-30 NOTE — Progress Notes (Signed)
  Subjective:    Rebecca Flynn is a 36 y.o. old female here for follow-up on cellulitis  HPI Cellulitis: patient was recently hospitalized for right groin cellulitis from 10/31-10/6.  She was discharged on oral clindamycin for 8 days to complete the course on 11/14.  She stated that her last dose was yesterday as expected. She was seen in clinic on 11/8 and 11/12 and had a packing with iodoform packing strip.  Today, she denies fever, worsening pain or erythema.  She admits some drainage.  She changed the dressing last night.  Patient has no history of diabetes.  Quit smoking about 5 years ago.  PMH/Problem List: has DUB (dysfunctional uterine bleeding); Obesity; Smoker; Infertility; and Cellulitis of right thigh on their problem list.   has a past medical history of Asthma, Bronchitis, Chlamydia, DUB (dysfunctional uterine bleeding), Herpes genitalis, Hydrosalpinx, PID (pelvic inflammatory disease), Pyelonephritis, Trichomonas, and Urinary tract infection.  FH:  Family History  Problem Relation Age of Onset  . Anesthesia problems Neg Hx   . Other Neg Hx     SH Social History   Tobacco Use  . Smoking status: Former Smoker    Packs/day: 0.50    Years: 17.00    Pack years: 8.50    Types: Cigarettes  . Smokeless tobacco: Never Used  Substance Use Topics  . Alcohol use: No    Alcohol/week: 0.0 oz    Comment: occ  . Drug use: No    Review of Systems Review of systems negative except for pertinent positives and negatives in history of present illness above.     Objective:     Vitals:   09/30/17 1501  BP: 112/78  Pulse: 79  Temp: 97.9 F (36.6 C)  TempSrc: Oral  SpO2: 96%  Weight: 248 lb 3.2 oz (112.6 kg)  Height: 5\' 6"  (1.676 m)   Body mass index is 40.06 kg/m.  Physical Exam GEN: appears well, no ditress EYES: PERRL, EOMI THROAT: MMM NECK: Supple RESP:  No IWOB, CTAB CVS:  RRR, normal S1&S2, no murmurs GI: soft, NT with active BS SKIN: Abdominal pad dressing  in place.  No significant discharge noted.  Two  iodoform dressing noted and removed.  No erythema, induration or tenderness to palpation.  I was not able to express any drainage with pressure.  NEURO: Grossly intact PSYCH: normal affect    Assessment and Plan:  1. Cellulitis of right thigh: Improving.  Completed antibiotic course yesterday.  On exam, no apparent erythema, discharge or signs of fluid loculation.  Iodoform dressing pack removed.  Gauze dressing applied.  Recommended keeping the area clean and dry.  She may change dressing every 2-3 days or sooner if needed. Return in 1 week for follow-up or sooner if she has erythema, drainage, fever or other symptoms concerning to her. I also recommended losing weight.  Return in about 1 week (around 10/07/2017) for Wound check.  Almon Herculesaye T Gonfa, MD 09/30/17 Pager: [phone]

## 2017-10-06 ENCOUNTER — Encounter: Payer: Self-pay | Admitting: Student

## 2017-10-06 ENCOUNTER — Ambulatory Visit: Payer: 59 | Admitting: Student

## 2017-12-02 DIAGNOSIS — L820 Inflamed seborrheic keratosis: Secondary | ICD-10-CM | POA: Diagnosis not present

## 2017-12-15 ENCOUNTER — Telehealth (HOSPITAL_COMMUNITY): Payer: Self-pay | Admitting: Emergency Medicine

## 2017-12-15 ENCOUNTER — Ambulatory Visit (HOSPITAL_COMMUNITY)
Admission: EM | Admit: 2017-12-15 | Discharge: 2017-12-15 | Disposition: A | Discharge status: Home / Self Care | Payer: 59 | Attending: Family Medicine | Admitting: Family Medicine

## 2017-12-15 ENCOUNTER — Encounter (HOSPITAL_COMMUNITY): Payer: Self-pay | Admitting: Emergency Medicine

## 2017-12-15 DIAGNOSIS — Z3202 Encounter for pregnancy test, result negative: Secondary | ICD-10-CM | POA: Diagnosis not present

## 2017-12-15 DIAGNOSIS — Z880 Allergy status to penicillin: Secondary | ICD-10-CM | POA: Diagnosis not present

## 2017-12-15 DIAGNOSIS — N390 Urinary tract infection, site not specified: Secondary | ICD-10-CM | POA: Insufficient documentation

## 2017-12-15 DIAGNOSIS — M5489 Other dorsalgia: Secondary | ICD-10-CM

## 2017-12-15 DIAGNOSIS — Z87891 Personal history of nicotine dependence: Secondary | ICD-10-CM | POA: Diagnosis not present

## 2017-12-15 DIAGNOSIS — R39198 Other difficulties with micturition: Secondary | ICD-10-CM

## 2017-12-15 LAB — POCT URINALYSIS DIP (DEVICE)
BILIRUBIN URINE: NEGATIVE
GLUCOSE, UA: NEGATIVE mg/dL
KETONES UR: NEGATIVE mg/dL
Leukocytes, UA: NEGATIVE
NITRITE: NEGATIVE
Protein, ur: NEGATIVE mg/dL
Specific Gravity, Urine: >=1.03 (ref 1.005–1.030)
Urobilinogen, UA: 0.2 mg/dL (ref 0.0–1.0)
pH: 6 (ref 5.0–8.0)

## 2017-12-15 LAB — POCT PREGNANCY, URINE: PREG TEST UR: NEGATIVE

## 2017-12-15 MED ORDER — CIPROFLOXACIN HCL 500 MG PO TABS
ORAL_TABLET | ORAL | Status: AC
  Filled 2017-12-15: qty 1

## 2017-12-15 MED ORDER — CIPROFLOXACIN HCL 500 MG PO TABS
500.0000 mg | ORAL_TABLET | Freq: Once | ORAL | Status: AC
  Administered 2017-12-15: 500 mg via ORAL

## 2017-12-15 MED ORDER — FLUCONAZOLE 150 MG PO TABS: 150.0000 mg | ORAL_TABLET | Freq: Once | ORAL | 0 refills | Status: AC

## 2017-12-15 MED ORDER — SULFAMETHOXAZOLE-TRIMETHOPRIM 800-160 MG PO TABS: 1.0000 | ORAL_TABLET | Freq: Two times a day (BID) | ORAL | 0 refills | Status: AC

## 2017-12-15 NOTE — ED Provider Notes (Signed)
[facility]   [id number] 12/15/17 Arrival Time: 1519   SUBJECTIVE:  Rebecca Flynn is a 37 y.o. female who presents to the urgent care with complaint of   Past Medical History:  Diagnosis Date  . Asthma   . Bronchitis   . Chlamydia   . DUB (dysfunctional uterine bleeding)   . Herpes genitalis   . Hydrosalpinx   . PID (pelvic inflammatory disease)   . Pyelonephritis   . Trichomonas   . Urinary tract infection    Family History  Problem Relation Age of Onset  . Anesthesia problems Neg Hx   . Other Neg Hx    Social History   Socioeconomic History  . Marital status: Single    Spouse name: Not on file  . Number of children: Not on file  . Years of education: Not on file  . Highest education level: Not on file  Social Needs  . Financial resource strain: Not on file  . Food insecurity - worry: Not on file  . Food insecurity - inability: Not on file  . Transportation needs - medical: Not on file  . Transportation needs - non-medical: Not on file  Occupational History  . Not on file  Tobacco Use  . Smoking status: Former Smoker    Packs/day: 0.50    Years: 17.00    Pack years: 8.50    Types: Cigarettes  . Smokeless tobacco: Never Used  Substance and Sexual Activity  . Alcohol use: No    Alcohol/week: 0.0 oz    Comment: occ  . Drug use: No  . Sexual activity: Yes    Birth control/protection: Condom  Other Topics Concern  . Not on file  Social History Narrative  . Not on file   Current Meds  Medication Sig  . megestrol (MEGACE) 40 MG tablet Take 1 tablet (40 mg total) by mouth daily. (Patient taking differently: Take 40 mg by mouth daily as needed (for menstral cycle). )   Allergies  Allergen Reactions  . Penicillins Nausea And Vomiting and Rash      ROS: As per HPI, remainder of ROS negative.   OBJECTIVE:   Vitals:   12/15/17 1533  BP: 127/82  Pulse: 78  Resp: 20  Temp: 98.8 F (37.1 C)  TempSrc: Oral  SpO2: 99%     General  appearance: alert; no distress Eyes: PERRL; EOMI; conjunctiva normal HENT: normocephalic; atraumatic;  oral mucosa normal Neck: supple Abdomen: soft, non-tender; bowel sounds normal; no masses or organomegaly; no guarding or rebound tenderness Back: no CVA tenderness Extremities: no cyanosis or edema; symmetrical with no gross deformities Skin: warm and dry Neurologic: normal gait; grossly normal Psychological: alert and cooperative; normal mood and affect      Labs:  Results for orders placed or performed during the hospital encounter of 12/15/17  POCT urinalysis dip (device)  Result Value Ref Range   Glucose, UA NEGATIVE NEGATIVE mg/dL   Bilirubin Urine NEGATIVE NEGATIVE   Ketones, ur NEGATIVE NEGATIVE mg/dL   Specific Gravity, Urine >=1.030 1.005 - 1.030   Hgb urine dipstick MODERATE (A) NEGATIVE   pH 6.0 5.0 - 8.0   Protein, ur NEGATIVE NEGATIVE mg/dL   Urobilinogen, UA 0.2 0.0 - 1.0 mg/dL   Nitrite NEGATIVE NEGATIVE   Leukocytes, UA NEGATIVE NEGATIVE  Pregnancy, urine POC  Result Value Ref Range   Preg Test, Ur NEGATIVE NEGATIVE    Labs Reviewed  POCT URINALYSIS DIP (DEVICE) - Abnormal; Notable for the following  components:      Result Value   Hgb urine dipstick MODERATE (*)    All other components within normal limits  URINE CULTURE  POCT PREGNANCY, URINE    No results found.     ASSESSMENT & PLAN:  1. Lower urinary tract infectious disease     Meds ordered this encounter  Medications  . ciprofloxacin (CIPRO) tablet 500 mg  . fluconazole (DIFLUCAN) 150 MG tablet    Sig: Take 1 tablet (150 mg total) by mouth once for 1 dose. Repeat if needed    Dispense:  2 tablet    Refill:  0  . sulfamethoxazole-trimethoprim (BACTRIM DS,SEPTRA DS) 800-160 MG tablet    Sig: Take 1 tablet by mouth 2 (two) times daily for 7 days.    Dispense:  10 tablet    Refill:  0    Reviewed expectations re: course of current medical issues. Questions answered. Outlined  signs and symptoms indicating need for more acute intervention. Patient verbalized understanding. After Visit Summary given.    Procedures:      Elvina Sidle, MD 12/15/17 1624

## 2017-12-15 NOTE — ED Triage Notes (Signed)
PT C/O: UTI sx onset yest associated w/back pain, decreased urine output  Sts she is not sexually active at the moment.   DENIES: dysuria, vag d/c  TAKING MEDS: none   A&O x4... NAD... Ambulatory

## 2017-12-15 NOTE — Telephone Encounter (Signed)
Verified medication dosing with dr hagler.  Bactrim, take one tablet bid for 7 days-Quantity 14 tablets.  Notified pharmacy of quantity as verified by dr hagler

## 2017-12-16 LAB — URINE CULTURE

## 2018-01-15 IMAGING — CT CT PELVIS W/ CM
2 of 3 series · 17 of 46 positions shown, 19 images · IV contrast (APPLIED)
Comparison: None.

CLINICAL DATA: Evaluate open wound in the medial thigh region.
Recent lancing of a subcutaneous abscess.

EXAM:
CT PELVIS WITH CONTRAST
TECHNIQUE: Multidetector CT imaging of the pelvis was performed using the
standard protocol following the bolus administration of intravenous
contrast.
CONTRAST:  100mL ZZSDPU-EII IOPAMIDOL (ZZSDPU-EII) INJECTION 61%

[Series 3: pelvis 5.0 · axial · 0.94mm/px · z∈[+550,+1020]mm · 14 of 108 slices shown, 16 images]
[im 7/108  soft-tissue]
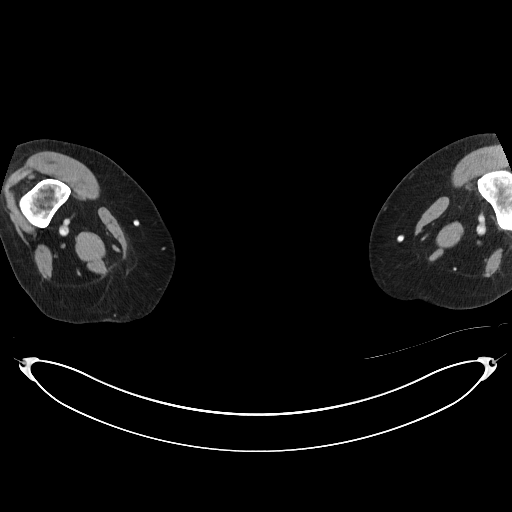
[im 7/108  bone]
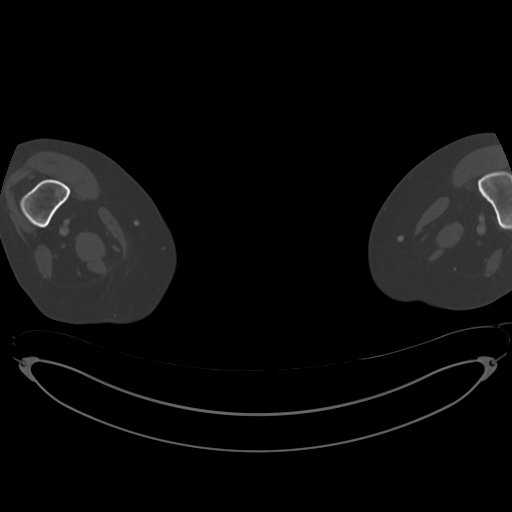
[im 14/108  soft-tissue]
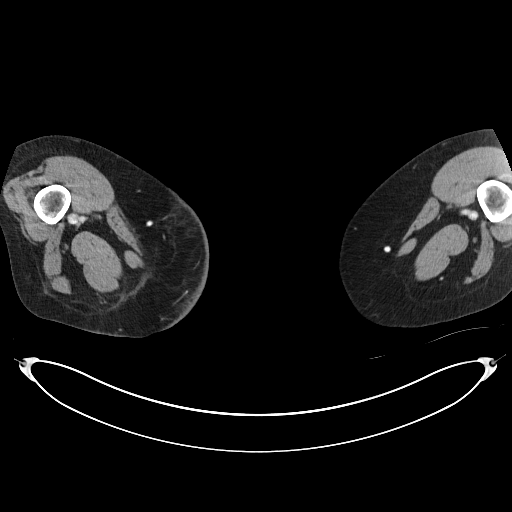
[im 21/108  soft-tissue]
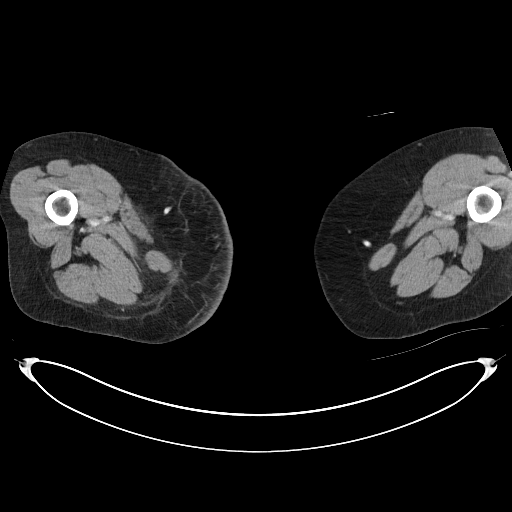
[im 28/108  soft-tissue]
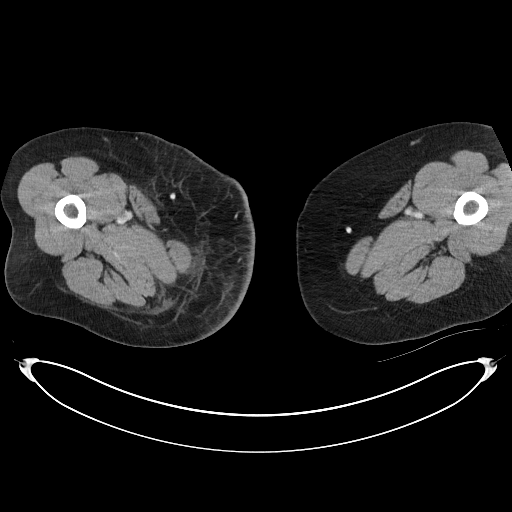
[im 35/108  soft-tissue]
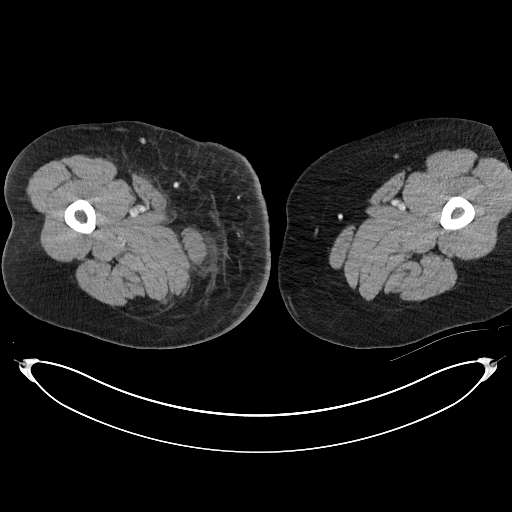
[im 42/108  soft-tissue]
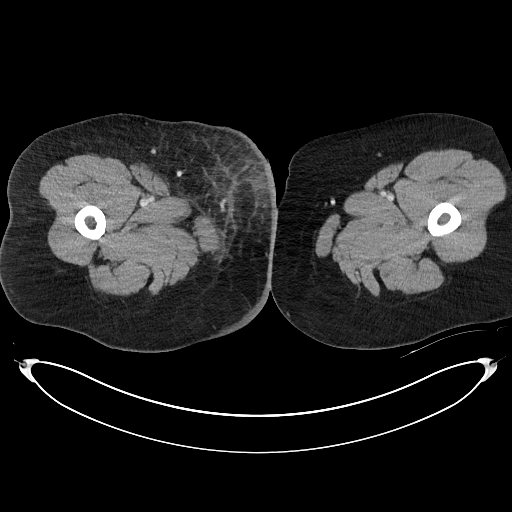
[im 49/108  soft-tissue]
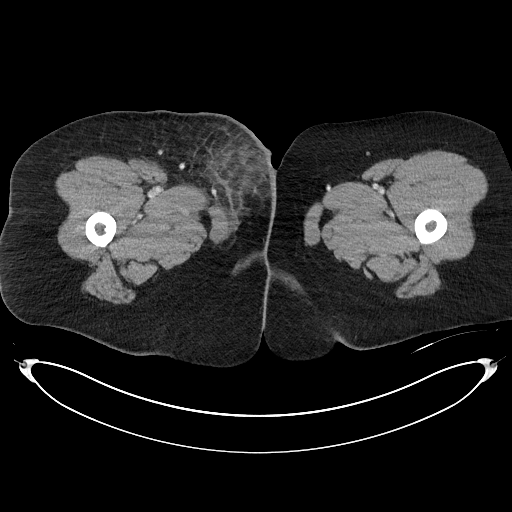
[im 59/108  soft-tissue]
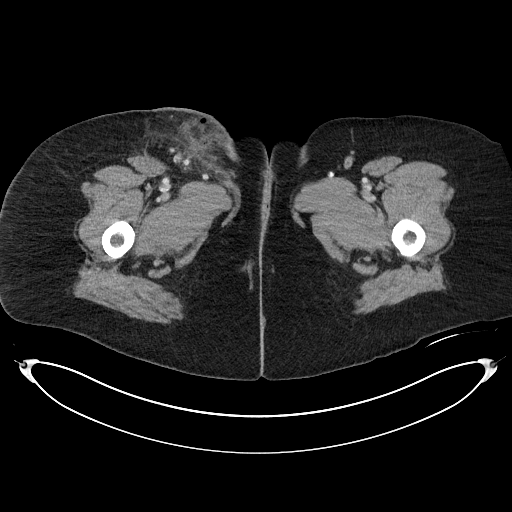
[im 66/108  soft-tissue]
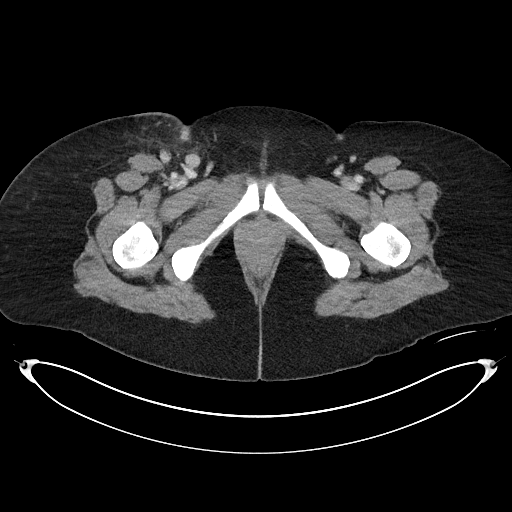
[im 66/108  bone]
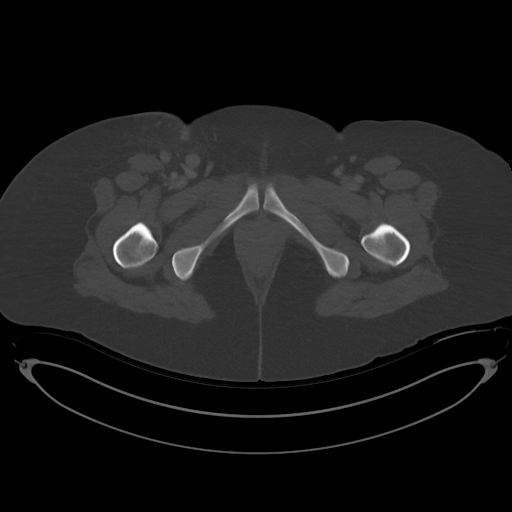
[im 73/108  soft-tissue]
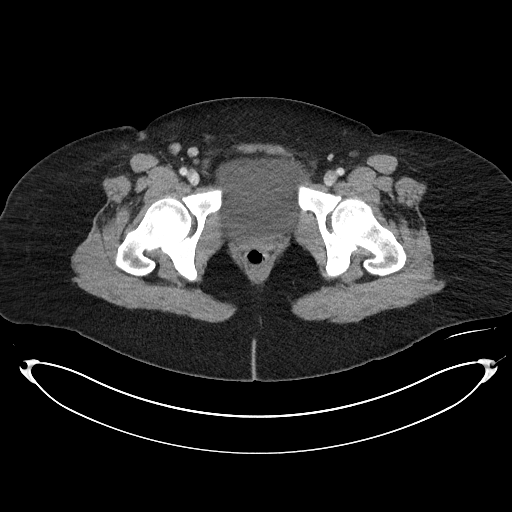
[im 80/108  soft-tissue]
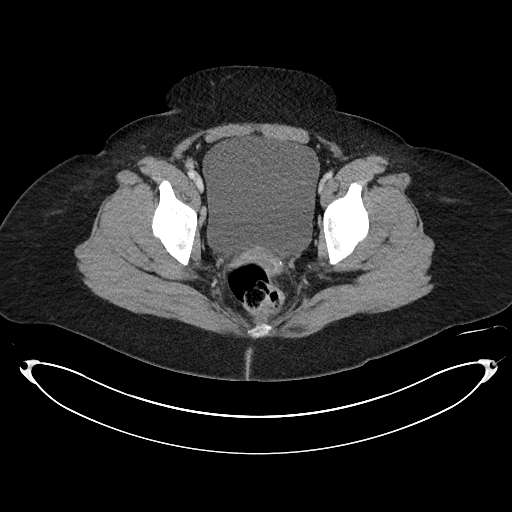
[im 87/108  soft-tissue]
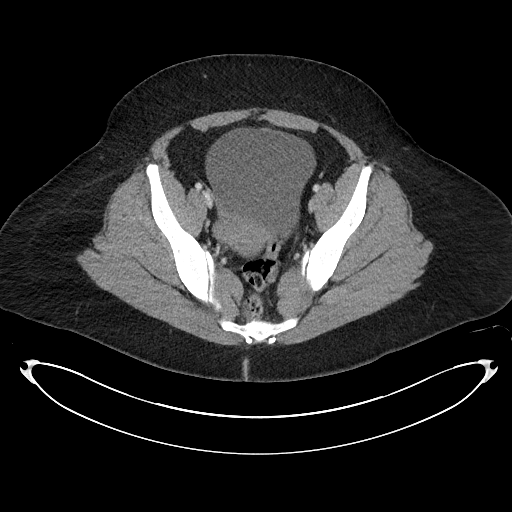
[im 94/108  soft-tissue]
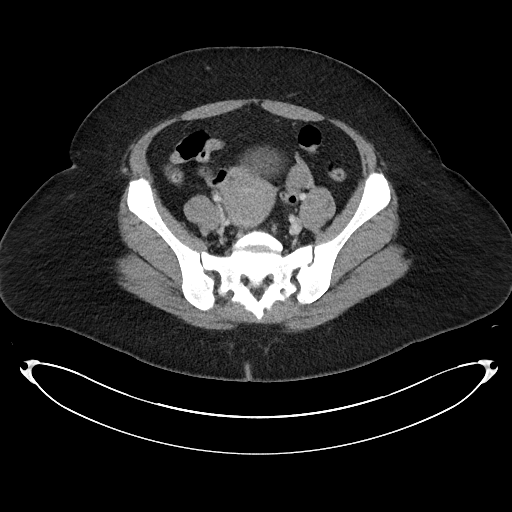
[im 101/108  soft-tissue]
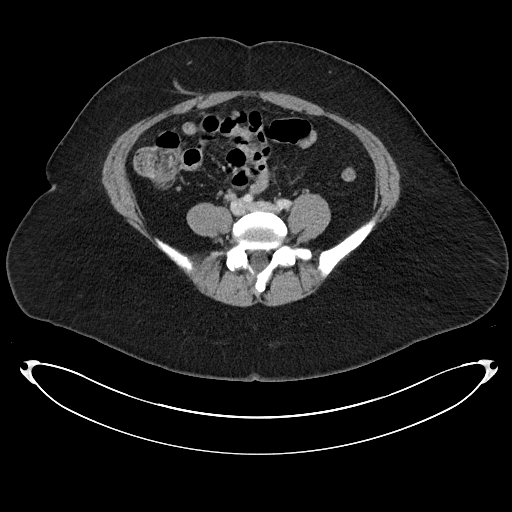

[Series 5: pelvis 3.0 mpr cor · coronal · 1.02mm/px · 3 of 123 slices shown]
[im 41/123  soft-tissue]
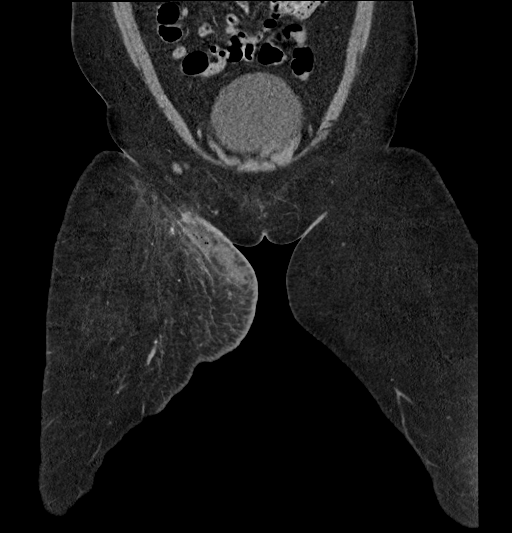
[im 55/123  soft-tissue]
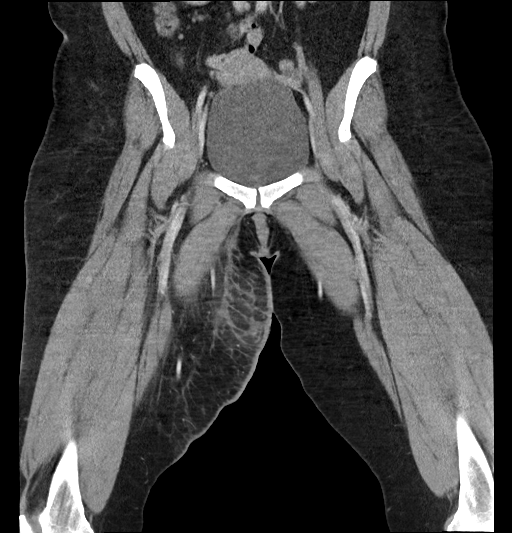
[im 68/123  soft-tissue]
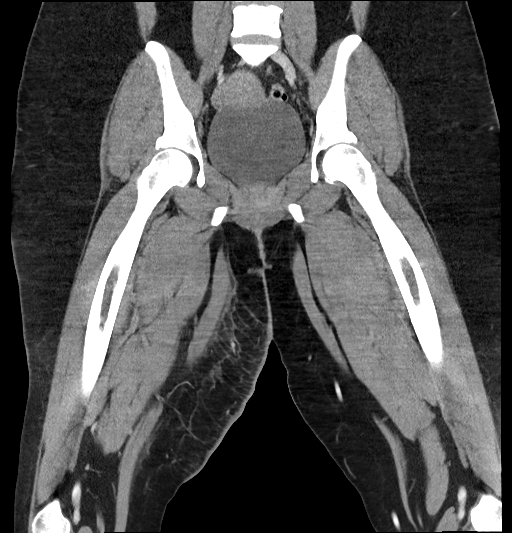

[17 of 46 positions shown; findings below may reference images not displayed]

FINDINGS: Urinary Tract: The bladder is unremarkable. It is mildly distended.
No bladder lesion or bladder calculi. No distal ureteral calculi.

Bowel: The visualized small bowel and colon are unremarkable. The
appendix is normal.

Vascular/Lymphatic: Mildly enlarged/ inflamed right external iliac
lymph nodes and right inguinal lymph nodes. The major vascular
structures are normal.

Reproductive:  Normal uterus and ovaries.

Other:  No intrapelvic inflammatory process/abscess.

Musculoskeletal: There is an open wound involving the the lower
right groin area with air in the subcutaneous tissues. No discrete
drainable soft tissue abscess is identified. Diffuse skin thickening
and subcutaneous edema consistent with cellulitis. No findings to
suggest myofasciitis or pyomyositis.

Associated inflamed/enlarged/hyperplastic right inguinal lymph nodes
and right external iliac lymph nodes.

The bony structures are unremarkable. No findings to suggest septic
arthritis or osteomyelitis.
IMPRESSION: 1. CT findings consistent with focus of significant cellulitis
involving the medial right thigh and inguinal area but no discrete
drainable soft tissue abscess, myofasciitis, pyomyositis, septic
arthritis or osteomyelitis.
2. No significant intrapelvic abnormalities.

## 2018-02-27 ENCOUNTER — Ambulatory Visit (HOSPITAL_COMMUNITY)
Admission: EM | Admit: 2018-02-27 | Discharge: 2018-02-27 | Disposition: A | Discharge status: Home / Self Care | Payer: 59 | Attending: Physician Assistant | Admitting: Physician Assistant

## 2018-02-27 ENCOUNTER — Other Ambulatory Visit: Payer: Self-pay

## 2018-02-27 ENCOUNTER — Encounter (HOSPITAL_COMMUNITY): Payer: Self-pay | Admitting: Emergency Medicine

## 2018-02-27 DIAGNOSIS — L02411 Cutaneous abscess of right axilla: Secondary | ICD-10-CM

## 2018-02-27 MED ORDER — CLINDAMYCIN HCL 300 MG PO CAPS: 300.0000 mg | ORAL_CAPSULE | Freq: Three times a day (TID) | ORAL | 0 refills | Status: DC

## 2018-02-27 NOTE — ED Triage Notes (Signed)
Noticed abscess last Tuesday.  Abscess is right axilla.

## 2018-02-27 NOTE — Discharge Instructions (Signed)
Use a warm compress often.  Take the antibiotic as prescribed.  For pain okay to take Iburpofen 600 mg every 6 hours.

## 2018-02-27 NOTE — ED Provider Notes (Signed)
02/27/2018 7:12 PM   DOB: 11/30/1980 / MRN: [id number]  SUBJECTIVE:  Rebecca Flynn is a 37 y.o. female presenting for right axillary pain and swelling that started 4 days ago it is getting worse.  She denies fever, chills, nausea, diaphoresis.  She is allergic to penicillins.   She  has a past medical history of Asthma, Bronchitis, Chlamydia, DUB (dysfunctional uterine bleeding), Herpes genitalis, Hydrosalpinx, PID (pelvic inflammatory disease), Pyelonephritis, Trichomonas, and Urinary tract infection.    She  reports that she has quit smoking. Her smoking use included cigarettes. She has a 8.50 pack-year smoking history. She has never used smokeless tobacco. She reports that she does not drink alcohol or use drugs. She  reports that she currently engages in sexual activity. She reports using the following method of birth control/protection: Condom. The patient  has a past surgical history that includes Cesarean section; Wisdom tooth extraction; and Ankle surgery.  Her family history is not on file.  ROS  OBJECTIVE:  BP 125/79 (BP Location: Left Arm)   Pulse 88   Temp 98.3 F (36.8 C) (Oral)   Resp (!) 22   SpO2 97%   Physical Exam  Constitutional: She is oriented to person, place, and time. She appears well-developed.  Eyes: Pupils are equal, round, and reactive to light. EOM are normal.  Cardiovascular: Normal rate.  Pulmonary/Chest: Effort normal.  Abdominal: She exhibits no distension.  Musculoskeletal: Normal range of motion.  Neurological: She is alert and oriented to person, place, and time. No cranial nerve deficit.  Skin: Skin is warm and dry. She is not diaphoretic.     Psychiatric: She has a normal mood and affect.  Vitals reviewed.  Risk and benefits discussed and verbal consent obtained. Anesthetic allergies reviewed. Patient anesthetized using 1:1 mix of 2% lidocaine with epi. A 1 cm incision was made using a number 11 blade and purulent material was expressed.  The  was not wound packed. The patient tolerated the procedure without difficulty.   A clean dressing was placed and wound care instructions were provided.    No results found for this or any previous visit (from the past 72 hour(s)).  No results found.  ASSESSMENT AND PLAN:  No orders of the defined types were placed in this encounter.    Abscess of axilla, right: Drained here.  Unfortunately was unable to remove the sac.  Starting her on clindamycin.  RTC precautions discussed with patient.      The patient is advised to call or return to clinic if she does not see an improvement in symptoms, or to seek the care of the closest emergency department if she worsens with the above plan.   Rebecca Flynn, MHS, Rebecca Flynn 02/27/2018 7:12 PM    Ofilia Neaslark, Rebecca Doubek Flynn, Rebecca Flynn 02/27/18 1913

## 2018-03-02 DIAGNOSIS — L821 Other seborrheic keratosis: Secondary | ICD-10-CM | POA: Diagnosis not present

## 2018-03-31 ENCOUNTER — Other Ambulatory Visit: Payer: Self-pay | Admitting: Advanced Practice Midwife

## 2018-04-18 ENCOUNTER — Other Ambulatory Visit: Payer: Self-pay | Admitting: Advanced Practice Midwife

## 2018-05-13 ENCOUNTER — Other Ambulatory Visit: Payer: Self-pay | Admitting: Advanced Practice Midwife

## 2018-08-10 ENCOUNTER — Other Ambulatory Visit: Payer: Self-pay | Admitting: Advanced Practice Midwife

## 2018-10-22 ENCOUNTER — Other Ambulatory Visit: Payer: Self-pay

## 2018-10-22 ENCOUNTER — Inpatient Hospital Stay (HOSPITAL_COMMUNITY)
Admission: AD | Admit: 2018-10-22 | Discharge: 2018-10-22 | Disposition: A | Discharge status: Home / Self Care | Payer: 59 | Source: Ambulatory Visit | Attending: Obstetrics and Gynecology | Admitting: Obstetrics and Gynecology

## 2018-10-22 DIAGNOSIS — B379 Candidiasis, unspecified: Secondary | ICD-10-CM

## 2018-10-22 DIAGNOSIS — Z3202 Encounter for pregnancy test, result negative: Secondary | ICD-10-CM | POA: Insufficient documentation

## 2018-10-22 DIAGNOSIS — B373 Candidiasis of vulva and vagina: Secondary | ICD-10-CM | POA: Insufficient documentation

## 2018-10-22 DIAGNOSIS — Z880 Allergy status to penicillin: Secondary | ICD-10-CM | POA: Insufficient documentation

## 2018-10-22 DIAGNOSIS — Z87891 Personal history of nicotine dependence: Secondary | ICD-10-CM | POA: Diagnosis not present

## 2018-10-22 DIAGNOSIS — L293 Anogenital pruritus, unspecified: Secondary | ICD-10-CM | POA: Diagnosis present

## 2018-10-22 DIAGNOSIS — N390 Urinary tract infection, site not specified: Secondary | ICD-10-CM | POA: Insufficient documentation

## 2018-10-22 LAB — WET PREP, GENITAL
CLUE CELLS WET PREP: NONE SEEN
Sperm: NONE SEEN
Trich, Wet Prep: NONE SEEN

## 2018-10-22 LAB — URINALYSIS, ROUTINE W REFLEX MICROSCOPIC
BILIRUBIN URINE: NEGATIVE
GLUCOSE, UA: NEGATIVE mg/dL
KETONES UR: NEGATIVE mg/dL
Nitrite: NEGATIVE
PROTEIN: NEGATIVE mg/dL
Specific Gravity, Urine: 1.021 (ref 1.005–1.030)
pH: 6 (ref 5.0–8.0)

## 2018-10-22 LAB — POCT PREGNANCY, URINE: PREG TEST UR: NEGATIVE

## 2018-10-22 MED ORDER — MEGESTROL ACETATE 40 MG PO TABS: 40.0000 mg | ORAL_TABLET | Freq: Every day | ORAL | 0 refills | Status: DC

## 2018-10-22 MED ORDER — SULFAMETHOXAZOLE-TRIMETHOPRIM 800-160 MG PO TABS: 1.0000 | ORAL_TABLET | Freq: Two times a day (BID) | ORAL | 0 refills | Status: AC

## 2018-10-22 MED ORDER — FLUCONAZOLE 150 MG PO TABS: 150.0000 mg | ORAL_TABLET | Freq: Every day | ORAL | 0 refills | Status: DC

## 2018-10-22 NOTE — MAU Provider Note (Signed)
History     CSN: [id number]  Arrival date and time: 10/22/18 1956   None     Chief Complaint  Patient presents with  . Vaginal Itching   HPI   [name] is a 37 y.o. female G2P2002 here with vaginal itching, frequency and urgency. Symptoms started yesterday. She thinks she has a UTI and a yeast infection.  No fever, no abdominal pain or back pain.   OB History    Gravida  2   Para  2   Term  2   Preterm      AB      Living  2     SAB      TAB      Ectopic      Multiple      Live Births              Past Medical History:  Diagnosis Date  . Asthma   . Bronchitis   . Chlamydia   . DUB (dysfunctional uterine bleeding)   . Herpes genitalis   . Hydrosalpinx   . PID (pelvic inflammatory disease)   . Pyelonephritis   . Trichomonas   . Urinary tract infection     Past Surgical History:  Procedure Laterality Date  . ANKLE SURGERY    . CESAREAN SECTION    . WISDOM TOOTH EXTRACTION      Family History  Problem Relation Age of Onset  . Anesthesia problems Neg Hx   . Other Neg Hx     Social History   Tobacco Use  . Smoking status: Former Smoker    Packs/day: 0.50    Years: 17.00    Pack years: 8.50    Types: Cigarettes  . Smokeless tobacco: Never Used  Substance Use Topics  . Alcohol use: No    Alcohol/week: 0.0 standard drinks    Comment: occ  . Drug use: No    Allergies:  Allergies  Allergen Reactions  . Penicillins Nausea And Vomiting and Rash    Medications Prior to Admission  Medication Sig Dispense Refill Last Dose  . acetaminophen (TYLENOL) 500 MG tablet Take 500 mg by mouth every 6 (six) hours as needed for moderate pain.   Unknown at Unknown time  . clindamycin (CLEOCIN) 300 MG capsule Take 1 capsule (300 mg total) by mouth 3 (three) times daily. 30 capsule 0   . megestrol (MEGACE) 40 MG tablet Take 1 tablet (40 mg total) by mouth daily. (Patient taking differently: Take 40 mg by mouth daily as needed (for menstral  cycle). ) 30 tablet 11 12/15/2017 at Unknown time   Results for orders placed or performed during the hospital encounter of 10/22/18 (from the past 48 hour(s))  Urinalysis, Routine w reflex microscopic     Status: Abnormal   Collection Time: 10/22/18  8:35 PM  Result Value Ref Range   Color, Urine YELLOW YELLOW   APPearance HAZY (A) CLEAR   Specific Gravity, Urine 1.021 1.005 - 1.030   pH 6.0 5.0 - 8.0   Glucose, UA NEGATIVE NEGATIVE mg/dL   Hgb urine dipstick MODERATE (A) NEGATIVE   Bilirubin Urine NEGATIVE NEGATIVE   Ketones, ur NEGATIVE NEGATIVE mg/dL   Protein, ur NEGATIVE NEGATIVE mg/dL   Nitrite NEGATIVE NEGATIVE   Leukocytes, UA LARGE (A) NEGATIVE   RBC / HPF 11-20 0 - 5 RBC/hpf   WBC, UA >50 (H) 0 - 5 WBC/hpf   Bacteria, UA MANY (A) NONE SEEN   Squamous  Epithelial / LPF 0-5 0 - 5   Mucus PRESENT     Comment: Performed at [facility], [address] [place], [place] [address]  Wet prep, genital     Status: Abnormal   Collection Time: 10/22/18  8:36 PM  Result Value Ref Range   Yeast Wet Prep HPF POC PRESENT (A) NONE SEEN   Trich, Wet Prep NONE SEEN NONE SEEN   Clue Cells Wet Prep HPF POC NONE SEEN NONE SEEN   WBC, Wet Prep HPF POC MODERATE (A) NONE SEEN    Comment: MODERATE BACTERIA SEEN   Sperm NONE SEEN     Comment: Performed at [facility], [address] [place], [place] [address]  Pregnancy, urine POC     Status: None   Collection Time: 10/22/18  8:40 PM  Result Value Ref Range   Preg Test, Ur NEGATIVE NEGATIVE    Comment:        THE SENSITIVITY OF THIS METHODOLOGY IS >24 mIU/mL    Review of Systems  Constitutional: Negative for chills and fever.  Genitourinary: Positive for frequency and urgency. Negative for dysuria, flank pain and hematuria.   Physical Exam   Blood pressure 121/89, pulse 72, temperature 98 F (36.7 C), temperature source Oral, resp. rate 18, weight 108 kg.  Physical Exam  Constitutional: She is oriented to person, place,  and time. She appears well-developed and well-nourished. No distress.  HENT:  Head: Normocephalic.  Eyes: Pupils are equal, round, and reactive to light.  Respiratory: Effort normal.  GI: Soft. There is no CVA tenderness.  Musculoskeletal: Normal range of motion.  Neurological: She is alert and oriented to person, place, and time.  Skin: Skin is warm. She is not diaphoretic.  Psychiatric: Her behavior is normal.   MAU Course  Procedures  None  MDM  UA Urine pregnancy test negative   Assessment and Plan   A:  1. Yeast infection   2. Acute UTI     P:  Discharge home in stable condition Rx: Bactrim, diflucan Increase oral fluids Discussed the importance of regular GYN care Return for emergencies only  Duane Lopeasch, Amaar Oshita I, NP 10/22/2018 9:33 PM

## 2018-10-22 NOTE — MAU Note (Signed)
Pt reports to MAU c/o vaginal itching and clear discharge. Pt also reports she feels like a UTI is coming. Pt states she used to take megace for her bleeding but she is out and feels every time she stops the medication she has a new problem.

## 2018-10-22 NOTE — Discharge Instructions (Signed)
Asymptomatic Bacteriuria Asymptomatic bacteriuria is the presence of a large number of bacteria in the urine without the usual symptoms of burning or frequent urination. What are the causes? This condition is caused by an increase in bacteria in the urine. This increase can be caused by:  Bacteria entering the urinary tract, such as during sex.  A blockage in the urinary tract, such as from kidney stones or a tumor.  Bladder problems that prevent the bladder from emptying.  What increases the risk? You are more likely to develop this condition if:  You have diabetes mellitus.  You are an elderly adult, especially if you are also in a long-term care facility.  You are pregnant and in the first trimester.  You have kidney stones.  You are female.  You have had a kidney transplant.  You have a leaky kidney tube valve (reflux).  You had a urinary catheter for a long period of time.  What are the signs or symptoms? There are no symptoms of this condition. How is this diagnosed? This condition is diagnosed with a urine test. Because this condition does not cause symptoms, it is usually diagnosed when a urine sample is taken to treat or diagnose another condition, such as pregnancy or kidney problems. Most women who are in their first trimester of pregnancy are screened for asymptomatic bacteriuria. How is this treated? Usually, treatment is not needed for this condition. Treating the condition can lead to other problems, such as a yeast infection or the growth of bacteria that do not respond to treatment (antibiotic-resistant bacteria). Some people, such as pregnant women and people with kidney transplants, do need treatment with antibiotic medicines to prevent kidney infection (pyelonephritis). In pregnant women, kidney infection can lead to premature labor, fetal growth restriction, or newborn death. Follow these instructions at home: Medicines  Take over-the-counter and  prescription medicines only as told by your health care provider.  If you were prescribed an antibiotic medicine, take it as told by your health care provider. Do not stop taking the antibiotic even if you start to feel better. General instructions  Monitor your condition for any changes.  Drink enough fluid to keep your urine clear or pale yellow.  Go to the bathroom more often to keep your bladder empty.  If you are female, keep the area around your vagina and rectum clean. Wipe yourself from front to back after urinating.  Keep all follow-up visits as told by your health care provider. This is important. Contact a health care provider if:  You notice any new symptoms, such as back pain or burning while urinating. Get help right away if:  You develop signs of an infection such as: ? A burning sensation when you urinate. ? Have pain when you urinate. ? Develop an intense need to urinate. ? Urinating more frequently. ? Back pain or pelvic pain. ? Fever or chills.  You have blood in your urine.  Your urine becomes discolored or cloudy.  Your urine smells bad.  You have severe pain that cannot be controlled with medicine. Summary  Asymptomatic bacteriuria is the presence of a large number of bacteria in the urine without the usual symptoms of burning or frequent urination.  Usually, treatment is not needed for this condition. Treating the condition can lead to other problems, such as too much yeast and the growth of antibiotic-resistant bacteria.  Some people, such as pregnant women and people with kidney transplants, do need treatment with antibiotic medicines to prevent   kidney infection (pyelonephritis).  If you were prescribed an antibiotic medicine, take it as told by your health care provider. Do not stop taking the antibiotic even if you start to feel better. This information is not intended to replace advice given to you by your health care provider. Make sure you  discuss any questions you have with your health care provider. Document Released: 11/02/2005 Document Revised: 10/27/2016 Document Reviewed: 10/27/2016 Elsevier Interactive Patient Education  2017 [employer]  

## 2018-10-24 LAB — GC/CHLAMYDIA PROBE AMP (~~LOC~~) NOT AT ARMC
Chlamydia: NEGATIVE
Neisseria Gonorrhea: NEGATIVE

## 2019-02-15 DIAGNOSIS — H10011 Acute follicular conjunctivitis, right eye: Secondary | ICD-10-CM | POA: Diagnosis not present

## 2019-03-28 ENCOUNTER — Telehealth: Payer: Self-pay | Admitting: Family Medicine

## 2019-03-28 NOTE — Telephone Encounter (Signed)
Patient called in wanting to get her annual exam scheduled. Patient was scheduled for 6/23 @ 8:55. Patient would also like a refill on her magace 40mg . Patient stated the pharmacy on file is correct. Informed patient I would send this message to the nurses and they will get back in touch with her. Verbalized understanding

## 2019-04-05 ENCOUNTER — Other Ambulatory Visit: Payer: Self-pay | Admitting: Obstetrics and Gynecology

## 2019-04-05 MED ORDER — MEGESTROL ACETATE 40 MG PO TABS
40.0000 mg | ORAL_TABLET | Freq: Every day | ORAL | 0 refills | Status: DC
Start: 2019-04-05 — End: 2019-05-09

## 2019-04-05 MED ORDER — MEGESTROL ACETATE 40 MG PO TABS
40.0000 mg | ORAL_TABLET | Freq: Every day | ORAL | 0 refills | Status: DC
Start: 2019-04-05 — End: 2019-04-05

## 2019-04-05 NOTE — Progress Notes (Signed)
Patient is scheduled for annual in June. She called asking for a refill of Megace due to DUB.    Rx: Megace: #60 no refill    Rasch, Harolyn Rutherford, NP 04/05/2019 8:16 AM

## 2019-05-09 ENCOUNTER — Encounter: Payer: Self-pay | Admitting: Obstetrics and Gynecology

## 2019-05-09 ENCOUNTER — Ambulatory Visit: Payer: 59 | Admitting: Obstetrics and Gynecology

## 2019-05-09 ENCOUNTER — Other Ambulatory Visit: Payer: Self-pay

## 2019-05-09 ENCOUNTER — Other Ambulatory Visit (HOSPITAL_COMMUNITY)
Admission: RE | Admit: 2019-05-09 | Discharge: 2019-05-09 | Disposition: A | Discharge status: Home / Self Care | Payer: 59 | Source: Ambulatory Visit | Attending: Obstetrics and Gynecology | Admitting: Obstetrics and Gynecology

## 2019-05-09 VITALS — BP 120/83 | HR 70 | Ht 64.0 in | Wt 238.9 lb

## 2019-05-09 DIAGNOSIS — Z01419 Encounter for gynecological examination (general) (routine) without abnormal findings: Secondary | ICD-10-CM

## 2019-05-09 DIAGNOSIS — Z8742 Personal history of other diseases of the female genital tract: Secondary | ICD-10-CM

## 2019-05-09 DIAGNOSIS — Z113 Encounter for screening for infections with a predominantly sexual mode of transmission: Secondary | ICD-10-CM

## 2019-05-09 DIAGNOSIS — Z1151 Encounter for screening for human papillomavirus (HPV): Secondary | ICD-10-CM

## 2019-05-09 DIAGNOSIS — A599 Trichomoniasis, unspecified: Secondary | ICD-10-CM

## 2019-05-09 DIAGNOSIS — N840 Polyp of corpus uteri: Secondary | ICD-10-CM | POA: Diagnosis not present

## 2019-05-09 DIAGNOSIS — Z124 Encounter for screening for malignant neoplasm of cervix: Secondary | ICD-10-CM | POA: Diagnosis not present

## 2019-05-09 LAB — POCT PREGNANCY, URINE: Preg Test, Ur: NEGATIVE

## 2019-05-09 MED ORDER — MEGESTROL ACETATE 40 MG PO TABS: 40.0000 mg | ORAL_TABLET | Freq: Every day | ORAL | 3 refills | Status: DC

## 2019-05-09 NOTE — Procedures (Signed)
Endometrial Biopsy Procedure Note  Pre-operative Diagnosis: History of AUB, BMI 40s  Post-operative Diagnosis: same. Atrophic endometrium   Procedure Details  Urine pregnancy test was done and result was negative.  The risks (including infection, bleeding, pain, and uterine perforation) and benefits of the procedure were explained to the patient and Written informed consent was obtained.  The patient was placed in the dorsal lithotomy position.  Bimanual exam showed the uterus to be in the neutral position.  A Graves' speculum inserted in the vagina, and the cervix was visualized and a pap smear performed. The cervix was then prepped with povidone iodine, and a sharp tenaculum was applied to the anterior lip of the cervix for stabilization.  A pipelle was inserted into the uterine cavity and sounded the uterus to a depth of 8cm.  A scant amount of tissue was collected after 1 passes; pt didn't tolerate procedure well at all so only one Russom done The sample was sent for pathologic examination.  Condition: Stable  Complications: None  Plan: The patient was advised to call for any fever or for prolonged or severe pain or bleeding. She was advised to use OTC analgesics as needed for mild to moderate pain. She was advised to avoid vaginal intercourse for 48 hours or until the bleeding has completely stopped.  Durene Romans MD Attending Center for [employer] [facility] Fish farm manager)

## 2019-05-09 NOTE — Progress Notes (Signed)
Obstetrics and Gynecology Annual Patient Evaluation  Appointment Date: 05/09/2019  [facility]  Primary Care Provider: Westley ChandlerBrown, Carina M  Referring Provider: Self  Chief Complaint:  Chief Complaint  Patient presents with  . Gynecologic Exam    History of Present Illness: Rebecca Flynn is a 38 y.o. African-American G2P2002 (No LMP recorded. (Menstrual status: Other).), seen for the above chief complaint. Her past medical history is significant for h/o c-section x 2, BMI 40s.   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 Patient seen back in 2015 by GYN and had normal u/s, pap smear.  She takes megace 40 qday and is amenorrheic on it and rarely has to increase to bid if she has BTB or spotting. W/o the megace she has long heavy periods that last for two weeks.    No breast s/s, fevers, chills, chest pain, SOB, nausea, vomiting, abdominal pain, dysuria, vaginal itching, dyspareunia, diarrhea, constipation  Review of Systems: as noted in the History of Present Illness.  Patient Active Problem List   Diagnosis Date Noted  . Obesity 01/05/2014  . Smoker 01/05/2014  . Infertility 01/05/2014    Past Medical History:  Past Medical History:  Diagnosis Date  . Asthma   . Bronchitis   . Cellulitis of right thigh 09/15/2017  . Chlamydia   . DUB (dysfunctional uterine bleeding)   . Herpes genitalis   . Hydrosalpinx   . PID (pelvic inflammatory disease)   . Pyelonephritis   . Trichomonas   . Urinary tract infection     Past Surgical History:  Past Surgical History:  Procedure Laterality Date  . ANKLE SURGERY    . CESAREAN SECTION     x2  . WISDOM TOOTH EXTRACTION      Past Obstetrical History:  OB History  Gravida Para Term Preterm AB Living  2 2 2  0 0 2  SAB TAB Ectopic Multiple Live Births  0 0   0 2    # Outcome Date GA Lbr Len/2nd Weight Sex Delivery Anes PTL Lv  2 Term      CS-LTranv     1 Term      CS-LTranv       Past  Gynecological History: As per HPI. She is currently using no method for contraception.   Social History:  Social History   Socioeconomic History  . Marital status: Single    Spouse name: Not on file  . Number of children: Not on file  . Years of education: Not on file  . Highest education level: Not on file  Occupational History  . Not on file  Social Needs  . Financial resource strain: Not on file  . Food insecurity    Worry: Not on file    Inability: Not on file  . Transportation needs    Medical: Not on file    Non-medical: Not on file  Tobacco Use  . Smoking status: Former Smoker    Packs/day: 0.50    Years: 17.00    Pack years: 8.50    Types: Cigarettes  . Smokeless tobacco: Never Used  Substance and Sexual Activity  . Alcohol use: No    Alcohol/week: 0.0 standard drinks    Comment: occ  . Drug use: No  . Sexual activity: Yes    Birth control/protection: Condom  Lifestyle  . Physical activity    Days per week: Not on file    Minutes per session: Not on file  .  Stress: Not on file  Relationships  . Social Herbalist on phone: Not on file    Gets together: Not on file    Attends religious service: Not on file    Active member of club or organization: Not on file    Attends meetings of clubs or organizations: Not on file    Relationship status: Not on file  . Intimate partner violence    Fear of current or ex partner: Not on file    Emotionally abused: Not on file    Physically abused: Not on file    Forced sexual activity: Not on file  Other Topics Concern  . Not on file  Social History Narrative  . Not on file    Family History:  Family History  Problem Relation Age of Onset  . Anesthesia problems Neg Hx   . Other Neg Hx    She denies any female cancers  Medications Abcde R. Novoa had no medications administered during this visit. Current Outpatient Medications  Medication Sig Dispense Refill  . acetaminophen (TYLENOL) 500 MG tablet  Take 500 mg by mouth every 6 (six) hours as needed for moderate pain.    . megestrol (MEGACE) 40 MG tablet Take 1 tablet (40 mg total) by mouth daily. 90 tablet 3  . clindamycin (CLEOCIN) 300 MG capsule Take 1 capsule (300 mg total) by mouth 3 (three) times daily. 30 capsule 0  . fluconazole (DIFLUCAN) 150 MG tablet Take 1 tablet (150 mg total) by mouth daily. 2 tablet 0   No current facility-administered medications for this visit.     Allergies Penicillins   Physical Exam:  BP 120/83   Pulse 70   Ht 5\' 4"  (1.626 m)   Wt 238 lb 14.4 oz (108.4 kg)   BMI 41.01 kg/m  Body mass index is 41.01 kg/m. General appearance: Well nourished, well developed female in no acute distress.  Neck:  Supple, normal appearance, and no thyromegaly  Cardiovascular: normal s1 and s2.  No murmurs, rubs or gallops. Respiratory:  Clear to auscultation bilateral. Normal respiratory effort Abdomen: positive bowel sounds and no masses, hernias; diffusely non tender to palpation, non distended Breasts: pt declines breast s/s Neuro/Psych:  Normal mood and affect.  Skin:  Warm and dry.  Lymphatic:  No inguinal lymphadenopathy.   Pelvic exam: is limited by body habitus EGBUS: within normal limits, Vagina: within normal limits and with no blood or discharge in the vault, Cervix: normal appearing cervix without tenderness, discharge or lesions. Uterus:  nonenlarged and non tender and Adnexa:  normal adnexa and no mass, fullness, tenderness Rectovaginal: deferred  See procedure note for embx  Laboratory: UPT negative  Radiology:  Technique:  Both transabdominal and transvaginal ultrasound examinations of the pelvis were performed.  Transabdominal technique was performed for global imaging of the pelvis including uterus, ovaries, adnexal regions, and pelvic cul-de-sac.  It was necessary to proceed with endovaginal exam following the transabdominal exam to visualize the endometrium and  adnexae.  Comparison:  05/05/2011  Findings: Uterus:  9.4 x 4.4 x 5.5 cm.  No fibroids identified.  Endometrium: Double layer thickness measures 15 mm transvaginally. No focal lesion visualized.  Right ovary: 2.4 x 1.9 x 2.1 cm.  Normal appearance.  Left ovary: 2.4 x 2.3 x 2.4 cm.  Normal appearance.  Other Findings:  No free fluid  IMPRESSION:  1.  No evidence of fibroids. 2.  Endometrial thickness measures 15 mm.  If bleeding remains unresponsive  to hormonal or medical therapy, sonohysterogram should be considered for focal lesion work-up. (Ref:  Radiological Reasoning: Algorithmic Workup of Abnormal Vaginal Bleeding with Endovaginal Sonography and Sonohysterography. AJR 2008; 161:[id number]) 3.  Normal ovaries.  No adnexal mass identified.   Original Report Authenticated By: Myles RosenthalJohn Stahl, M.D  Normal uterus and ovaries on 08/2017 CT pelvis with contrast  Assessment: pt stable  Plan:  1. Well woman exam Recommend bloodwork such as PRL, TSH. Patient declines blood work.   Follow up pap  Recommend getting an embx b/c she's never had one and with her long history and bmi, even though she is on megace, there is a risk of malignancy. Follow up embx  Options d/w patient and she remembered re: other medications, hysteroscopy, ablation that was d/w her in the past. D/w her other options that may not impact weight gain. Pt to stay with megace for now.   Patient amenable to mammo at age 38. Front desk to set up on check out  - Cytology - PAP( [place]) - Surgical pathology( [facility]/ [facility])  RTC 1 year  Cornelia Copaharlie Tiyanna Larcom, Jr MD Attending [facility](Faculty Practice)

## 2019-05-10 LAB — CYTOLOGY - PAP
Adequacy: ABSENT
Chlamydia: NEGATIVE
Diagnosis: NEGATIVE
HPV: NOT DETECTED
Neisseria Gonorrhea: NEGATIVE
Trichomonas: POSITIVE — AB

## 2019-05-13 DIAGNOSIS — A599 Trichomoniasis, unspecified: Secondary | ICD-10-CM | POA: Insufficient documentation

## 2019-05-13 HISTORY — DX: Trichomoniasis, unspecified: A59.9

## 2019-05-13 MED ORDER — METRONIDAZOLE 500 MG PO TABS: 2000.0000 mg | ORAL_TABLET | Freq: Once | ORAL | 0 refills | Status: AC

## 2019-05-13 MED ORDER — ONDANSETRON 8 MG PO TBDP: 8.0000 mg | ORAL_TABLET | Freq: Once | ORAL | 0 refills | Status: AC

## 2019-05-13 NOTE — Addendum Note (Signed)
Addended by: Aletha Halim on: 05/13/2019 10:00 AM   Modules accepted: Orders

## 2019-05-15 ENCOUNTER — Other Ambulatory Visit: Payer: Self-pay

## 2019-05-15 DIAGNOSIS — Z01419 Encounter for gynecological examination (general) (routine) without abnormal findings: Secondary | ICD-10-CM

## 2019-05-15 MED ORDER — FLUCONAZOLE 150 MG PO TABS: 150.0000 mg | ORAL_TABLET | Freq: Every day | ORAL | 0 refills | Status: DC

## 2019-05-17 ENCOUNTER — Other Ambulatory Visit: Payer: Self-pay

## 2019-05-17 MED ORDER — METRONIDAZOLE 500 MG PO TABS: 2000.0000 mg | ORAL_TABLET | Freq: Once | ORAL | 0 refills | Status: AC

## 2019-05-23 ENCOUNTER — Encounter: Payer: Self-pay | Admitting: *Deleted

## 2019-05-29 ENCOUNTER — Telehealth: Payer: Self-pay | Admitting: Obstetrics and Gynecology

## 2019-05-29 ENCOUNTER — Encounter: Payer: Self-pay | Admitting: Obstetrics and Gynecology

## 2019-05-29 DIAGNOSIS — N840 Polyp of corpus uteri: Secondary | ICD-10-CM | POA: Insufficient documentation

## 2019-05-29 NOTE — Telephone Encounter (Signed)
GYN Telephone Note  Patient called at [phone] to d/w her re: options. She took the flagyl w/o issue. See inbasket message to patient. Pt to consider options and let me know   Durene Romans MD Attending Center for [facility] (Faculty Practice) 05/29/2019 Time: [phone]

## 2020-04-10 ENCOUNTER — Other Ambulatory Visit: Payer: Self-pay | Admitting: Obstetrics and Gynecology

## 2020-04-10 DIAGNOSIS — Z01419 Encounter for gynecological examination (general) (routine) without abnormal findings: Secondary | ICD-10-CM

## 2020-04-10 DIAGNOSIS — Z8742 Personal history of other diseases of the female genital tract: Secondary | ICD-10-CM

## 2020-04-10 MED ORDER — MEGESTROL ACETATE 40 MG PO TABS: 40.0000 mg | ORAL_TABLET | Freq: Every day | ORAL | 3 refills | Status: DC

## 2020-08-28 ENCOUNTER — Encounter (HOSPITAL_COMMUNITY): Payer: Self-pay | Admitting: Emergency Medicine

## 2020-08-28 ENCOUNTER — Other Ambulatory Visit: Payer: Self-pay

## 2020-08-28 ENCOUNTER — Ambulatory Visit (HOSPITAL_COMMUNITY)
Admission: EM | Admit: 2020-08-28 | Discharge: 2020-08-28 | Disposition: A | Discharge status: Home / Self Care | Payer: 59 | Attending: Internal Medicine | Admitting: Internal Medicine

## 2020-08-28 DIAGNOSIS — N760 Acute vaginitis: Secondary | ICD-10-CM | POA: Diagnosis present

## 2020-08-28 LAB — POCT URINALYSIS DIPSTICK, ED / UC
Glucose, UA: NEGATIVE mg/dL
Nitrite: NEGATIVE
Protein, ur: NEGATIVE mg/dL
Specific Gravity, Urine: >=1.03 (ref 1.005–1.030)
Urobilinogen, UA: 1 mg/dL (ref 0.0–1.0)
pH: 5.5 (ref 5.0–8.0)

## 2020-08-28 MED ORDER — NITROFURANTOIN MONOHYD MACRO 100 MG PO CAPS: 100.0000 mg | ORAL_CAPSULE | Freq: Two times a day (BID) | ORAL | 0 refills | Status: DC

## 2020-08-28 MED ORDER — FLUCONAZOLE 150 MG PO TABS: 150.0000 mg | ORAL_TABLET | Freq: Once | ORAL | 0 refills | Status: AC

## 2020-08-28 NOTE — ED Triage Notes (Signed)
Pt c/o vaginal itching x 2 days. Pt states she feels like she is urinating more frequently and has some dysuria. Pt denies vaginal discharge.

## 2020-08-28 NOTE — ED Provider Notes (Addendum)
[facility]    CSN: [id number] Arrival date & time: 08/28/20  1735      History   Chief Complaint Chief Complaint  Patient presents with  . Vaginitis    HPI Rebecca Flynn is a 39 y.o. female comes to the urgent care with a 2-day history of vaginal itching, dysuria and some urinary frequency.  Symptoms started 2 days ago and has been persistent.  She denies any nausea or vomiting.  No flank pain.  No vaginal discharge.  No abdominal pain.  No dyspareunia.  Patient will like to be screened for STDs as well during this visit.   HPI  Past Medical History:  Diagnosis Date  . Asthma   . Bronchitis   . Cellulitis of right thigh 09/15/2017  . Chlamydia   . DUB (dysfunctional uterine bleeding)   . Herpes genitalis   . Hydrosalpinx   . PID (pelvic inflammatory disease)   . Pyelonephritis   . Trichomonas   . Urinary tract infection     Patient Active Problem List   Diagnosis Date Noted  . Endometrial polyp 05/29/2019  . Trichomoniasis 05/13/2019  . Obesity 01/05/2014  . Smoker 01/05/2014  . Infertility 01/05/2014  . Abnormal uterine bleeding (AUB) 12/09/2012    Past Surgical History:  Procedure Laterality Date  . ANKLE SURGERY    . CESAREAN SECTION     x2  . WISDOM TOOTH EXTRACTION      OB History    Gravida  2   Para  2   Term  2   Preterm  0   AB  0   Living  2     SAB  0   TAB  0   Ectopic      Multiple  0   Live Births  2            Home Medications    Prior to Admission medications   Medication Sig Start Date End Date Taking? Authorizing Provider  fluconazole (DIFLUCAN) 150 MG tablet Take 1 tablet (150 mg total) by mouth once for 1 dose. 08/28/20 08/28/20  Merrilee Jansky, MD  megestrol (MEGACE) 40 MG tablet Take 1 tablet (40 mg total) by mouth daily. 04/10/20   Chapman Bing, MD  nitrofurantoin, macrocrystal-monohydrate, (MACROBID) 100 MG capsule Take 1 capsule (100 mg total) by mouth 2 (two) times daily. 08/28/20    LampteyBritta Mccreedy, MD    Family History Family History  Problem Relation Age of Onset  . Anesthesia problems Neg Hx   . Other Neg Hx     Social History Social History   Tobacco Use  . Smoking status: Former Smoker    Packs/day: 0.50    Years: 17.00    Pack years: 8.50    Types: Cigarettes  . Smokeless tobacco: Never Used  Substance Use Topics  . Alcohol use: No    Alcohol/week: 0.0 standard drinks    Comment: occ  . Drug use: No     Allergies   Penicillins   Review of Systems Review of Systems  Respiratory: Negative.   Gastrointestinal: Negative for abdominal pain, nausea and vomiting.  Genitourinary: Positive for dysuria, frequency and urgency. Negative for difficulty urinating, dyspareunia, flank pain, menstrual problem, pelvic pain, vaginal discharge and vaginal pain.  Musculoskeletal: Negative.      Physical Exam Triage Vital Signs ED Triage Vitals  Enc Vitals Group     BP --      Pulse Rate 08/28/20 1832  83     Resp 08/28/20 1832 16     Temp 08/28/20 1832 98.1 F (36.7 C)     Temp Source 08/28/20 1832 Oral     SpO2 08/28/20 1832 100 %     Weight --      Height --      Head Circumference --      Peak Flow --      Pain Score 08/28/20 1829 0     Pain Loc --      Pain Edu? --      Excl. in GC? --    No data found.  Updated Vital Signs Pulse 83   Temp 98.1 F (36.7 C) (Oral)   Resp 16   SpO2 100%   Visual Acuity Right Eye Distance:   Left Eye Distance:   Bilateral Distance:    Right Eye Near:   Left Eye Near:    Bilateral Near:     Physical Exam Vitals and nursing note reviewed.  Constitutional:      General: She is not in acute distress.    Appearance: She is not ill-appearing.  Cardiovascular:     Rate and Rhythm: Normal rate and regular rhythm.     Pulses: Normal pulses.     Heart sounds: Normal heart sounds.  Pulmonary:     Effort: Pulmonary effort is normal.     Breath sounds: Normal breath sounds.  Neurological:      Mental Status: She is alert.      UC Treatments / Results  Labs (all labs ordered are listed, but only abnormal results are displayed) Labs Reviewed  POCT URINALYSIS DIPSTICK, ED / UC - Abnormal; Notable for the following components:      Result Value   Bilirubin Urine SMALL (*)    Ketones, ur TRACE (*)    Hgb urine dipstick LARGE (*)    Leukocytes,Ua TRACE (*)    All other components within normal limits  URINE CULTURE  CERVICOVAGINAL ANCILLARY ONLY    EKG   Radiology No results found.  Procedures Procedures (including critical care time)  Medications Ordered in UC Medications - No data to display  Initial Impression / Assessment and Plan / UC Course  I have reviewed the triage vital signs and the nursing notes.  Pertinent labs & imaging results that were available during my care of the patient were reviewed by me and considered in my medical decision making (see chart for details).     1.  Acute vaginitis: Point-of-care urinalysis is positive for leukocyte Estrace and hemoglobin Urine culture sent Keflex 500 mg twice daily for 5 days Fluconazole 150 mg orally x1 dose, may be repeated in 72 hours. Cervicovaginal swab for GC/chlamydia/trichomonas/BV/yeast Safe sex practices recommended If STD screen is significant we will manage the patient appropriately. Return precautions given. Final Clinical Impressions(s) / UC Diagnoses   Final diagnoses:  Acute vaginitis   Discharge Instructions   None    ED Prescriptions    Medication Sig Dispense Auth. Provider   nitrofurantoin, macrocrystal-monohydrate, (MACROBID) 100 MG capsule Take 1 capsule (100 mg total) by mouth 2 (two) times daily. 10 capsule Ledora Delker, Britta Mccreedy, MD   fluconazole (DIFLUCAN) 150 MG tablet Take 1 tablet (150 mg total) by mouth once for 1 dose. 2 tablet Huldah Marin, Britta Mccreedy, MD     PDMP not reviewed this encounter.   Merrilee Jansky, MD 08/28/20 Aretha Parrot    Merrilee Jansky, MD 08/28/20  [phone]

## 2020-08-29 ENCOUNTER — Telehealth (HOSPITAL_COMMUNITY): Payer: Self-pay | Admitting: Emergency Medicine

## 2020-08-29 ENCOUNTER — Encounter: Payer: Self-pay | Admitting: Family Medicine

## 2020-08-29 LAB — CERVICOVAGINAL ANCILLARY ONLY
Bacterial Vaginitis (gardnerella): POSITIVE — AB
Candida Glabrata: NEGATIVE
Candida Vaginitis: POSITIVE — AB
Chlamydia: NEGATIVE
Comment: NEGATIVE
Comment: NEGATIVE
Comment: NEGATIVE
Comment: NEGATIVE
Comment: NEGATIVE
Comment: NORMAL
Neisseria Gonorrhea: NEGATIVE
Trichomonas: NEGATIVE

## 2020-08-29 LAB — URINE CULTURE

## 2020-08-29 MED ORDER — METRONIDAZOLE 500 MG PO TABS: 500.0000 mg | ORAL_TABLET | Freq: Two times a day (BID) | ORAL | 0 refills | Status: DC

## 2020-11-26 ENCOUNTER — Other Ambulatory Visit: Payer: 59

## 2020-11-26 DIAGNOSIS — Z20822 Contact with and (suspected) exposure to covid-19: Secondary | ICD-10-CM

## 2020-11-28 ENCOUNTER — Encounter: Payer: Self-pay | Admitting: Family Medicine

## 2020-11-28 LAB — NOVEL CORONAVIRUS, NAA: SARS-CoV-2, NAA: NOT DETECTED

## 2020-11-28 LAB — SARS-COV-2, NAA 2 DAY TAT

## 2021-01-15 ENCOUNTER — Other Ambulatory Visit: Payer: Self-pay

## 2021-01-15 ENCOUNTER — Ambulatory Visit (HOSPITAL_COMMUNITY)
Admission: EM | Admit: 2021-01-15 | Discharge: 2021-01-15 | Disposition: A | Discharge status: Home / Self Care | Payer: 59 | Attending: Family Medicine | Admitting: Family Medicine

## 2021-01-15 ENCOUNTER — Encounter (HOSPITAL_COMMUNITY): Payer: Self-pay

## 2021-01-15 DIAGNOSIS — L02412 Cutaneous abscess of left axilla: Secondary | ICD-10-CM

## 2021-01-15 MED ORDER — HIBICLENS 4 % EX LIQD: Freq: Every day | CUTANEOUS | 0 refills | Status: DC | PRN

## 2021-01-15 MED ORDER — SULFAMETHOXAZOLE-TRIMETHOPRIM 800-160 MG PO TABS: 1.0000 | ORAL_TABLET | Freq: Two times a day (BID) | ORAL | 0 refills | Status: AC

## 2021-01-15 NOTE — ED Triage Notes (Signed)
Pt in c/o abscess under her right arm that she noticed about 1 week ago  Pt denies any drainage  Pt has been using warm compresses for relief

## 2021-01-15 NOTE — ED Provider Notes (Signed)
[facility]    CSN: [id number] Arrival date & time: 01/15/21  1758      History   Chief Complaint Chief Complaint  Patient presents with  . Abscess    HPI Rebecca Flynn is a 40 y.o. female.   Patient here today for evaluation of 1 week history of painful mass in left axilla.  She states is progressively worsening since onset.  History of axillary abscesses per patient, and this feels consistent with that.  She denies fevers, chills, sweats, body aches, drainage from the area.  Has been applying warm compresses and taking over-the-counter pain relievers with very minimal relief at this time.  No injury to the area.  Denies swelling, numbness, tingling, weakness down the arm.     Past Medical History:  Diagnosis Date  . Asthma   . Bronchitis   . Cellulitis of right thigh 09/15/2017  . Chlamydia   . DUB (dysfunctional uterine bleeding)   . Herpes genitalis   . Hydrosalpinx   . PID (pelvic inflammatory disease)   . Pyelonephritis   . Trichomonas   . Urinary tract infection     Patient Active Problem List   Diagnosis Date Noted  . Endometrial polyp 05/29/2019  . Trichomoniasis 05/13/2019  . Obesity 01/05/2014  . Smoker 01/05/2014  . Infertility 01/05/2014  . Abnormal uterine bleeding (AUB) 12/09/2012    Past Surgical History:  Procedure Laterality Date  . ANKLE SURGERY    . CESAREAN SECTION     x2  . WISDOM TOOTH EXTRACTION      OB History    Gravida  2   Para  2   Term  2   Preterm  0   AB  0   Living  2     SAB  0   IAB  0   Ectopic      Multiple  0   Live Births  2            Home Medications    Prior to Admission medications   Medication Sig Start Date End Date Taking? Authorizing Provider  chlorhexidine (HIBICLENS) 4 % external liquid Apply topically daily as needed. 01/15/21  Yes Particia Nearing, PA-C  sulfamethoxazole-trimethoprim (BACTRIM DS) 800-160 MG tablet Take 1 tablet by mouth 2 (two) times daily for  7 days. 01/15/21 01/22/21 Yes Particia Nearing, PA-C  megestrol (MEGACE) 40 MG tablet Take 1 tablet (40 mg total) by mouth daily. 04/10/20   Hitchcock Bing, MD  metroNIDAZOLE (FLAGYL) 500 MG tablet Take 1 tablet (500 mg total) by mouth 2 (two) times daily. 08/29/20   Lamptey, Britta Mccreedy, MD  nitrofurantoin, macrocrystal-monohydrate, (MACROBID) 100 MG capsule Take 1 capsule (100 mg total) by mouth 2 (two) times daily. 08/28/20   LampteyBritta Mccreedy, MD    Family History Family History  Problem Relation Age of Onset  . Anesthesia problems Neg Hx   . Other Neg Hx     Social History Social History   Tobacco Use  . Smoking status: Former Smoker    Packs/day: 0.50    Years: 17.00    Pack years: 8.50    Types: Cigarettes  . Smokeless tobacco: Never Used  Substance Use Topics  . Alcohol use: No    Alcohol/week: 0.0 standard drinks    Comment: occ  . Drug use: No     Allergies   Penicillins   Review of Systems Review of Systems Per HPI  Physical Exam Triage Vital  Signs ED Triage Vitals  Enc Vitals Group     BP 01/15/21 1820 128/87     Pulse Rate 01/15/21 1820 85     Resp 01/15/21 1820 19     Temp 01/15/21 1820 98.1 F (36.7 C)     Temp src --      SpO2 01/15/21 1820 97 %     Weight --      Height --      Head Circumference --      Peak Flow --      Pain Score 01/15/21 1919 9     Pain Loc --      Pain Edu? --      Excl. in GC? --    No data found.  Updated Vital Signs BP 128/87   Pulse 85   Temp 98.1 F (36.7 C)   Resp 19   SpO2 97%   Visual Acuity Right Eye Distance:   Left Eye Distance:   Bilateral Distance:    Right Eye Near:   Left Eye Near:    Bilateral Near:     Physical Exam Vitals and nursing note reviewed.  Constitutional:      Appearance: Normal appearance. She is not ill-appearing.  HENT:     Head: Atraumatic.  Eyes:     Extraocular Movements: Extraocular movements intact.     Conjunctiva/sclera: Conjunctivae normal.   Cardiovascular:     Rate and Rhythm: Normal rate and regular rhythm.     Heart sounds: Normal heart sounds.  Pulmonary:     Effort: Pulmonary effort is normal.     Breath sounds: Normal breath sounds.  Musculoskeletal:        General: Normal range of motion.     Cervical back: Normal range of motion and neck supple.  Skin:    General: Skin is warm and dry.     Findings: Erythema present.     Comments: 2.5 cm erythematous fluctuant mass left axilla, significantly tender to palpation No active drainage from area.  Neurological:     Mental Status: She is alert and oriented to person, place, and time.  Psychiatric:        Thought Content: Thought content normal.        Judgment: Judgment normal.     Comments: Anxious, tearful      UC Treatments / Results  Labs (all labs ordered are listed, but only abnormal results are displayed) Labs Reviewed - No data to display  EKG   Radiology No results found.  Procedures Incision and Drainage  Date/Time: 01/16/2021 8:21 AM Performed by: Particia Nearing, PA-C Authorized by: Particia Nearing, PA-C   Consent:    Consent obtained:  Verbal   Consent given by:  Patient   Risks, benefits, and alternatives were discussed: yes     Risks discussed:  Bleeding, incomplete drainage, pain and infection   Alternatives discussed:  Alternative treatment Universal protocol:    Procedure explained and questions answered to patient or proxy's satisfaction: yes     Relevant documents present and verified: yes     Site/side marked: yes     Immediately prior to procedure, a time out was called: yes     Patient identity confirmed:  Verbally with patient and arm band Location:    Type:  Abscess   Size:  2.5   Location: Left axilla. Pre-procedure details:    Skin preparation:  Chlorhexidine with alcohol Sedation:    Sedation type:  None Anesthesia:  Anesthesia method:  Local infiltration   Local anesthetic:  Lidocaine 2% WITH  epi Procedure type:    Complexity:  Simple Procedure details:    Ultrasound guidance: no     Needle aspiration: no     Incision types:  Stab incision   Incision depth:  Subcutaneous   Wound management:  Probed and deloculated   Drainage:  Purulent and bloody   Drainage amount:  Moderate   Packing materials:  None Post-procedure details:    Procedure completion:  Tolerated well, no immediate complications   (including critical care time)  Medications Ordered in UC Medications - No data to display  Initial Impression / Assessment and Plan / UC Course  I have reviewed the triage vital signs and the nursing notes.  Pertinent labs & imaging results that were available during my care of the patient were reviewed by me and considered in my medical decision making (see chart for details).     I&D of left axillary abscess performed today without immediate complications noted.  Patient tolerated procedure well.  Will send home on antibiotics and apply Hibiclens twice daily to area, Neosporin, keep covered.  Warm compresses off and on.  Over-the-counter pain relievers as needed.  Follow-up for wound recheck in 1 week if not fully resolved.  May use Hibiclens in the future several times weekly to areas for preventative maintenance. Final Clinical Impressions(s) / UC Diagnoses   Final diagnoses:  Abscess of left axilla   Discharge Instructions   None    ED Prescriptions    Medication Sig Dispense Auth. Provider   sulfamethoxazole-trimethoprim (BACTRIM DS) 800-160 MG tablet Take 1 tablet by mouth 2 (two) times daily for 7 days. 14 tablet Particia Nearing, [place]   chlorhexidine (HIBICLENS) 4 % external liquid Apply topically daily as needed. 120 mL Particia Nearing, [place]     PDMP not reviewed this encounter.   Roosvelt Maser [employer], [place] 01/16/21 [phone]

## 2021-01-16 DIAGNOSIS — L02412 Cutaneous abscess of left axilla: Secondary | ICD-10-CM | POA: Diagnosis not present

## 2021-03-25 ENCOUNTER — Encounter: Payer: Self-pay | Admitting: *Deleted

## 2021-07-08 ENCOUNTER — Other Ambulatory Visit: Payer: Self-pay | Admitting: Obstetrics and Gynecology

## 2021-07-08 DIAGNOSIS — Z01419 Encounter for gynecological examination (general) (routine) without abnormal findings: Secondary | ICD-10-CM

## 2021-07-08 DIAGNOSIS — Z8742 Personal history of other diseases of the female genital tract: Secondary | ICD-10-CM

## 2021-09-29 ENCOUNTER — Encounter: Payer: Self-pay | Admitting: *Deleted

## 2021-10-27 ENCOUNTER — Ambulatory Visit (HOSPITAL_COMMUNITY)
Admission: EM | Admit: 2021-10-27 | Discharge: 2021-10-27 | Disposition: A | Discharge status: Home / Self Care | Payer: 59 | Attending: Family Medicine | Admitting: Family Medicine

## 2021-10-27 ENCOUNTER — Other Ambulatory Visit: Payer: Self-pay

## 2021-10-27 ENCOUNTER — Encounter (HOSPITAL_COMMUNITY): Payer: Self-pay

## 2021-10-27 DIAGNOSIS — N390 Urinary tract infection, site not specified: Secondary | ICD-10-CM | POA: Diagnosis not present

## 2021-10-27 DIAGNOSIS — M545 Low back pain, unspecified: Secondary | ICD-10-CM | POA: Diagnosis present

## 2021-10-27 LAB — POCT URINALYSIS DIPSTICK, ED / UC
Bilirubin Urine: NEGATIVE
Glucose, UA: NEGATIVE mg/dL
Leukocytes,Ua: NEGATIVE
Nitrite: NEGATIVE
Protein, ur: NEGATIVE mg/dL
Specific Gravity, Urine: >=1.03 (ref 1.005–1.030)
Urobilinogen, UA: 0.2 mg/dL (ref 0.0–1.0)
pH: 5.5 (ref 5.0–8.0)

## 2021-10-27 MED ORDER — CIPROFLOXACIN HCL 500 MG PO TABS: 500.0000 mg | ORAL_TABLET | Freq: Two times a day (BID) | ORAL | 0 refills | Status: AC

## 2021-10-27 MED ORDER — FLUCONAZOLE 150 MG PO TABS: 150.0000 mg | ORAL_TABLET | Freq: Once | ORAL | 0 refills | Status: AC

## 2021-10-27 NOTE — Discharge Instructions (Addendum)
Take cipro 500 mg twice daily for 7 days for the possible urine infection.  If not improving, we may need to evaluate you for a kidney stone, or do xrays looking at your back or hip.  Drink plenty of fluids.

## 2021-10-27 NOTE — ED Triage Notes (Signed)
Pt presents with c/o a possible kidney infection. States she was cramping 3 days ago and states yesterday the pain worsened.

## 2021-10-27 NOTE — ED Provider Notes (Addendum)
[facility]    CSN: [id number] Arrival date & time: 10/27/21  1739      History   Chief Complaint Chief Complaint  Patient presents with   Back Pain    HPI Rebecca Flynn is a 40 y.o. female.    Back Pain Here for a 2-3 day h/o pain in low back on the right. Is similar to when she has had UTI's before. No f/c. No v/d, but has maybe had some nausea (appetite decreased). No dysuria.   Periods irregular, but states is on megestrol for that, and has not had intercourse in some time.   The pain does worsen when she sits too long or stands too long    Past Medical History:  Diagnosis Date   Asthma    Bronchitis    Cellulitis of right thigh 09/15/2017   Chlamydia    DUB (dysfunctional uterine bleeding)    Herpes genitalis    Hydrosalpinx    PID (pelvic inflammatory disease)    Pyelonephritis    Trichomonas    Urinary tract infection     Patient Active Problem List   Diagnosis Date Noted   Endometrial polyp 05/29/2019   Trichomoniasis 05/13/2019   Obesity 01/05/2014   Smoker 01/05/2014   Infertility 01/05/2014   Abnormal uterine bleeding (AUB) 12/09/2012    Past Surgical History:  Procedure Laterality Date   ANKLE SURGERY     CESAREAN SECTION     x2   WISDOM TOOTH EXTRACTION      OB History     Gravida  2   Para  2   Term  2   Preterm  0   AB  0   Living  2      SAB  0   IAB  0   Ectopic      Multiple  0   Live Births  2            Home Medications    Prior to Admission medications   Medication Sig Start Date End Date Taking? Authorizing Provider  ciprofloxacin (CIPRO) 500 MG tablet Take 1 tablet (500 mg total) by mouth 2 (two) times daily for 7 days. 10/27/21 11/03/21 Yes Zenia Resides, MD  fluconazole (DIFLUCAN) 150 MG tablet Take 1 tablet (150 mg total) by mouth once for 1 dose. 10/27/21 10/27/21 Yes Zenia Resides, MD  megestrol (MEGACE) 40 MG tablet Take 40 mg by mouth daily.   Yes [provider]  chlorhexidine (HIBICLENS) 4 % external liquid Apply topically daily as needed. 01/15/21   Particia Nearing, PA-C    Family History Family History  Problem Relation Age of Onset   Anesthesia problems Neg Hx    Other Neg Hx     Social History Social History   Tobacco Use   Smoking status: Former    Packs/day: 0.50    Years: 17.00    Pack years: 8.50    Types: Cigarettes   Smokeless tobacco: Never  Substance Use Topics   Alcohol use: No    Alcohol/week: 0.0 standard drinks    Comment: occ   Drug use: No     Allergies   Penicillins   Review of Systems Review of Systems  Musculoskeletal:  Positive for back pain.    Physical Exam Triage Vital Signs ED Triage Vitals  Enc Vitals Group     BP 10/27/21 1843 (!) 135/91     Pulse Rate 10/27/21 1842 72  Resp 10/27/21 1842 19     Temp 10/27/21 1842 98.3 F (36.8 C)     Temp Source 10/27/21 1842 Oral     SpO2 10/27/21 1842 99 %     Weight --      Height --      Head Circumference --      Peak Flow --      Pain Score 10/27/21 1842 8     Pain Loc --      Pain Edu? --      Excl. in [place]? --    No data found.  Updated Vital Signs BP (!) 135/91   Pulse 72   Temp 98.3 F (36.8 C) (Oral)   Resp 19   LMP  (LMP Unknown)   SpO2 99%   Visual Acuity Right Eye Distance:   Left Eye Distance:   Bilateral Distance:    Right Eye Near:   Left Eye Near:    Bilateral Near:     Physical Exam Vitals reviewed.  Constitutional:      Appearance: She is not toxic-appearing.  HENT:     Nose: Nose normal.     Mouth/Throat:     Mouth: Mucous membranes are moist.     Pharynx: No oropharyngeal exudate.  Eyes:     Extraocular Movements: Extraocular movements intact.     Pupils: Pupils are equal, round, and reactive to light.  Cardiovascular:     Rate and Rhythm: Normal rate and regular rhythm.     Heart sounds: No murmur heard. Pulmonary:     Effort: Pulmonary effort is normal.     Breath sounds:  Normal breath sounds.  Abdominal:     Palpations: Abdomen is soft. There is no mass.     Tenderness: There is abdominal tenderness (mild tenderness in the suprapubic area).  Musculoskeletal:     Cervical back: Neck supple.  Lymphadenopathy:     Cervical: No cervical adenopathy.  Skin:    Coloration: Skin is not jaundiced or pale.  Neurological:     General: No focal deficit present.     Mental Status: She is alert and oriented to person, place, and time.  Psychiatric:        Behavior: Behavior normal.     UC Treatments / Results  Labs (all labs ordered are listed, but only abnormal results are displayed) Labs Reviewed  POCT URINALYSIS DIPSTICK, ED / UC - Abnormal; Notable for the following components:      Result Value   Ketones, ur TRACE (*)    Hgb urine dipstick LARGE (*)    All other components within normal limits  URINE CULTURE    EKG   Radiology No results found.  Procedures Procedures (including critical care time)  Medications Ordered in UC Medications - No data to display  Initial Impression / Assessment and Plan / UC Course  I have reviewed the triage vital signs and the nursing notes.  Pertinent labs & imaging results that were available during my care of the patient were reviewed by me and considered in my medical decision making (see chart for details).     UA shows pos for blood, neg wbc, trace of ketones.   With her history will begin antibiotics now, but also do urine c/s. Discussed possibility of this being more musculoskeletal in origin, as opposed to due to her urine. Consider renal stone if not improving or if c/s neg.  She requested fluconazole for potential yeast infection with the antibiotic  Final Clinical Impressions(s) / UC Diagnoses   Final diagnoses:  Acute right-sided low back pain without sciatica  Lower urinary tract infectious disease     Discharge Instructions      Take cipro 500 mg twice daily for 7 days for the  possible urine infection.  If not improving, we may need to evaluate you for a kidney stone, or do xrays looking at your back or hip.  Drink plenty of fluids.     ED Prescriptions     Medication Sig Dispense Auth. Provider   ciprofloxacin (CIPRO) 500 MG tablet Take 1 tablet (500 mg total) by mouth 2 (two) times daily for 7 days. 14 tablet Zenia Resides, MD   fluconazole (DIFLUCAN) 150 MG tablet Take 1 tablet (150 mg total) by mouth once for 1 dose. 1 tablet Marlinda Mike Janace Aris, MD      PDMP not reviewed this encounter.   Zenia Resides, MD 10/27/21 1911    Zenia Resides, MD 10/27/21 [phone]

## 2021-10-28 LAB — URINE CULTURE

## 2022-08-25 ENCOUNTER — Encounter (HOSPITAL_COMMUNITY): Payer: Self-pay | Admitting: Emergency Medicine

## 2022-08-25 ENCOUNTER — Ambulatory Visit (HOSPITAL_COMMUNITY)
Admission: EM | Admit: 2022-08-25 | Discharge: 2022-08-25 | Disposition: A | Discharge status: Home / Self Care | Payer: 59 | Attending: Internal Medicine | Admitting: Internal Medicine

## 2022-08-25 DIAGNOSIS — L299 Pruritus, unspecified: Secondary | ICD-10-CM | POA: Diagnosis not present

## 2022-08-25 DIAGNOSIS — R21 Rash and other nonspecific skin eruption: Secondary | ICD-10-CM

## 2022-08-25 DIAGNOSIS — L258 Unspecified contact dermatitis due to other agents: Secondary | ICD-10-CM | POA: Diagnosis not present

## 2022-08-25 MED ORDER — METHYLPREDNISOLONE SODIUM SUCC 125 MG IJ SOLR
125.0000 mg | Freq: Once | INTRAMUSCULAR | Status: AC
  Administered 2022-08-25: 125 mg via INTRAMUSCULAR

## 2022-08-25 MED ORDER — BENADRYL ITCH STOPPING 1-0.1 % EX CREA: TOPICAL_CREAM | Freq: Three times a day (TID) | CUTANEOUS | 0 refills | Status: DC | PRN

## 2022-08-25 MED ORDER — METHYLPREDNISOLONE SODIUM SUCC 125 MG IJ SOLR
INTRAMUSCULAR | Status: AC
  Filled 2022-08-25: qty 2

## 2022-08-25 MED ORDER — PREDNISONE 20 MG PO TABS: 40.0000 mg | ORAL_TABLET | Freq: Every day | ORAL | 0 refills | Status: AC

## 2022-08-25 NOTE — Discharge Instructions (Addendum)
We gave you a steroid injection today in the clinic to help with your itching. Attempted to avoid itching the rash as this will cause worsening bleeding, scarring, and symptoms.  Start prednisone 40 mg once daily tomorrow morning with breakfast for the next 5 days.  Take this medicine with food to avoid stomach upset.  Do not take any ibuprofen while you are taking prednisone.  You may apply Benadryl cream every 8 hours to the rash to help with itch further as needed.  Wash all of your clothing and bedding in hot water.   If you develop any new or worsening symptoms or do not improve in the next 2 to 3 days, please return.  If your symptoms are severe, please go to the emergency room.  Follow-up with your primary care provider for further evaluation and management of your symptoms as well as ongoing wellness visits.  I hope you feel better!

## 2022-08-25 NOTE — ED Provider Notes (Signed)
[facility]    CSN: [id number] Arrival date & time: 08/25/22  1228      History   Chief Complaint Chief Complaint  Patient presents with   Rash    HPI Rebecca Flynn is a 41 y.o. female.   Patient presents to urgent care for evaluation of generalized rash due to insect bites that started 5-6 days ago. She is confident that the rash is due to flea bites and states she does not know how they got into her house. She has attempted to flea bomb the house unsuccessfully. She has also tried using neosporin antibiotic cream to the areas of greatest itch without relief. She states she is "miserable" and the itching has only gotten worse since initial onset of symptoms. Denies throat closing sensation, chest pain, shortness of breath, and headache. No fever/chills reported. Bites are worse to the bilateral lower extremities. No recent exposure to new personal hygiene products, new bedding, poisonous plants, or any other possible trigger. This has never happened in the past and has also attempted use of benadryl over the counter without relief of symptoms.      Past Medical History:  Diagnosis Date   Asthma    Bronchitis    Cellulitis of right thigh 09/15/2017   Chlamydia    DUB (dysfunctional uterine bleeding)    Herpes genitalis    Hydrosalpinx    PID (pelvic inflammatory disease)    Pyelonephritis    Trichomonas    Urinary tract infection     Patient Active Problem List   Diagnosis Date Noted   Endometrial polyp 05/29/2019   Trichomoniasis 05/13/2019   Obesity 01/05/2014   Smoker 01/05/2014   Infertility 01/05/2014   Abnormal uterine bleeding (AUB) 12/09/2012    Past Surgical History:  Procedure Laterality Date   ANKLE SURGERY     CESAREAN SECTION     x2   WISDOM TOOTH EXTRACTION      OB History     Gravida  2   Para  2   Term  2   Preterm  0   AB  0   Living  2      SAB  0   IAB  0   Ectopic      Multiple  0   Live Births  2             Home Medications    Prior to Admission medications   Medication Sig Start Date End Date Taking? Authorizing Provider  diphenhydrAMINE-zinc acetate (BENADRYL ITCH STOPPING) cream Apply topically 3 (three) times daily as needed for itching. 08/25/22  Yes Carlisle Beers, FNP  predniSONE (DELTASONE) 20 MG tablet Take 2 tablets (40 mg total) by mouth daily for 5 days. 08/25/22 08/30/22 Yes Leib Elahi, Donavan Burnet, FNP  chlorhexidine (HIBICLENS) 4 % external liquid Apply topically daily as needed. 01/15/21   Particia Nearing, PA-C  megestrol (MEGACE) 40 MG tablet Take 40 mg by mouth daily.    [provider]    Family History Family History  Problem Relation Age of Onset   Anesthesia problems Neg Hx    Other Neg Hx     Social History Social History   Tobacco Use   Smoking status: Former    Packs/day: 0.50    Years: 17.00    Total pack years: 8.50    Types: Cigarettes   Smokeless tobacco: Never  Substance Use Topics   Alcohol use: No    Alcohol/week:  0.0 standard drinks of alcohol    Comment: occ   Drug use: No     Allergies   Penicillins   Review of Systems Review of Systems Per HPI  Physical Exam Triage Vital Signs ED Triage Vitals  Enc Vitals Group     BP 08/25/22 1318 (!) 145/117     Pulse Rate 08/25/22 1318 74     Resp 08/25/22 1318 20     Temp 08/25/22 1318 98.4 F (36.9 C)     Temp Source 08/25/22 1318 Oral     SpO2 08/25/22 1318 100 %     Weight --      Height --      Head Circumference --      Peak Flow --      Pain Score 08/25/22 1317 9     Pain Loc --      Pain Edu? --      Excl. in [place]? --    No data found.  Updated Vital Signs BP (!) 145/117 (BP Location: Right Arm)   Pulse 74   Temp 98.4 F (36.9 C) (Oral)   Resp 20   LMP 08/05/2022   SpO2 100%   Visual Acuity Right Eye Distance:   Left Eye Distance:   Bilateral Distance:    Right Eye Near:   Left Eye Near:    Bilateral Near:     Physical  Exam Vitals and nursing note reviewed.  Constitutional:      Appearance: She is not ill-appearing or toxic-appearing.  HENT:     Head: Normocephalic and atraumatic.     Right Ear: Hearing and external ear normal.     Left Ear: Hearing and external ear normal.     Nose: Nose normal.     Mouth/Throat:     Lips: Pink.  Eyes:     General: Lids are normal. Vision grossly intact. Gaze aligned appropriately.     Extraocular Movements: Extraocular movements intact.     Conjunctiva/sclera: Conjunctivae normal.  Cardiovascular:     Rate and Rhythm: Normal rate and regular rhythm.     Heart sounds: Normal heart sounds, S1 normal and S2 normal.  Pulmonary:     Effort: Pulmonary effort is normal. No respiratory distress.     Breath sounds: Normal breath sounds and air entry.  Musculoskeletal:     Cervical back: Neck supple.  Skin:    General: Skin is warm and dry.     Capillary Refill: Capillary refill takes less than 2 seconds.     Findings: Rash present.     Comments: Generalized bites to the body as seen below in images. Rash is intensely pruritic and patient appears to be very uncomfortable in exam room. Skin is mildly warm to the touch.   Neurological:     General: No focal deficit present.     Mental Status: She is alert and oriented to person, place, and time. Mental status is at baseline.     Cranial Nerves: No dysarthria or facial asymmetry.  Psychiatric:        Mood and Affect: Mood normal.        Speech: Speech normal.        Behavior: Behavior normal.        Thought Content: Thought content normal.        Judgment: Judgment normal.            UC Treatments / Results  Labs (all labs ordered are listed, but only  abnormal results are displayed) Labs Reviewed - No data to display  EKG   Radiology No results found.  Procedures Procedures (including critical care time)  Medications Ordered in UC Medications  methylPREDNISolone sodium succinate (SOLU-MEDROL)  125 mg/2 mL injection 125 mg (125 mg Intramuscular Given 08/25/22 1412)    Initial Impression / Assessment and Plan / UC Course  I have reviewed the triage vital signs and the nursing notes.  Pertinent labs & imaging results that were available during my care of the patient were reviewed by me and considered in my medical decision making (see chart for details).   1. Contact dermatitis and pruritic rash Patient is very uncomfortable and rash is intensely pruritic. 125mg  solumedrol given in clinic (she is not a diabetic) for relief of itch and symptoms. She is to begin taking prednisone 40mg  burst for 5 days starting tomorrow morning with breakfast. Advised to avoid ibuprofen while taking prednisone due to increased risk of GI bleeding. She may use benadryl cream to the areas of greatest itching every 8 hours as needed for further topical relief of symptoms. If symptoms worsen or fail to improve in the next 24-48 hours, she has been advised to return to urgent care or PCP for reevaluation. Patient expresses agreement with this plan.  Discussed physical exam and available lab work findings in clinic with patient.  Counseled patient regarding appropriate use of medications and potential side effects for all medications recommended or prescribed today. Discussed red flag signs and symptoms of worsening condition,when to call the PCP office, return to urgent care, and when to seek higher level of care in the emergency department. Patient verbalizes understanding and agreement with plan. All questions answered. Patient discharged in stable condition.    Final Clinical Impressions(s) / UC Diagnoses   Final diagnoses:  Contact dermatitis due to other agent, unspecified contact dermatitis type  Rash  Pruritus     Discharge Instructions      We gave you a steroid injection today in the clinic to help with your itching. Attempted to avoid itching the rash as this will cause worsening bleeding,  scarring, and symptoms.  Start prednisone 40 mg once daily tomorrow morning with breakfast for the next 5 days.  Take this medicine with food to avoid stomach upset.  Do not take any ibuprofen while you are taking prednisone.  You may apply Benadryl cream every 8 hours to the rash to help with itch further as needed.  Wash all of your clothing and bedding in hot water.   If you develop any new or worsening symptoms or do not improve in the next 2 to 3 days, please return.  If your symptoms are severe, please go to the emergency room.  Follow-up with your primary care provider for further evaluation and management of your symptoms as well as ongoing wellness visits.  I hope you feel better!   ED Prescriptions     Medication Sig Dispense Auth. Provider   predniSONE (DELTASONE) 20 MG tablet Take 2 tablets (40 mg total) by mouth daily for 5 days. 10 tablet , FNP   diphenhydrAMINE-zinc acetate (BENADRYL ITCH STOPPING) cream Apply topically 3 (three) times daily as needed for itching. 28.3 g , FNP      PDMP not reviewed this encounter.   Carlisle Beers, Carlisle Beers 08/26/22 1404

## 2022-08-25 NOTE — ED Triage Notes (Signed)
Pt reports rash/possible flee bites all over for over week. Tried ointment and Benadryl but getting worse.

## 2023-01-03 ENCOUNTER — Encounter (HOSPITAL_COMMUNITY): Payer: Self-pay | Admitting: *Deleted

## 2023-01-03 ENCOUNTER — Ambulatory Visit (HOSPITAL_COMMUNITY)
Admission: EM | Admit: 2023-01-03 | Discharge: 2023-01-03 | Disposition: A | Discharge status: Home / Self Care | Payer: 59 | Attending: Physician Assistant | Admitting: Physician Assistant

## 2023-01-03 DIAGNOSIS — R109 Unspecified abdominal pain: Secondary | ICD-10-CM | POA: Diagnosis present

## 2023-01-03 LAB — POCT URINALYSIS DIPSTICK, ED / UC
Glucose, UA: NEGATIVE mg/dL
Leukocytes,Ua: NEGATIVE
Nitrite: NEGATIVE
Protein, ur: 30 mg/dL — AB
Specific Gravity, Urine: >=1.03 (ref 1.005–1.030)
Urobilinogen, UA: 1 mg/dL (ref 0.0–1.0)
pH: 5.5 (ref 5.0–8.0)

## 2023-01-03 LAB — POC URINE PREG, ED: Preg Test, Ur: NEGATIVE

## 2023-01-03 MED ORDER — ONDANSETRON 4 MG PO TBDP
4.0000 mg | ORAL_TABLET | Freq: Once | ORAL | Status: AC
  Administered 2023-01-03: 4 mg via ORAL

## 2023-01-03 MED ORDER — FLUCONAZOLE 150 MG PO TABS: ORAL_TABLET | ORAL | 0 refills | Status: DC

## 2023-01-03 MED ORDER — ONDANSETRON 4 MG PO TBDP
ORAL_TABLET | ORAL | Status: AC
  Filled 2023-01-03: qty 1

## 2023-01-03 MED ORDER — NITROFURANTOIN MONOHYD MACRO 100 MG PO CAPS: 100.0000 mg | ORAL_CAPSULE | Freq: Two times a day (BID) | ORAL | 0 refills | Status: DC

## 2023-01-03 NOTE — ED Triage Notes (Signed)
C/O bilat flank pain and nausea x 3-4 days. Reports she feels she has been running a fever; has been taking Tyl. C/O polyuria.

## 2023-01-03 NOTE — ED Provider Notes (Signed)
[facility]    CSN: [id number] Arrival date & time: 01/03/23  1558      History   Chief Complaint Chief Complaint  Patient presents with   Flank Pain    HPI Rebecca Flynn is a 42 y.o. female.   Patient here today for evaluation of nausea and bilateral flank pain with associated polyuria she has had for 3 to 4 days.  She states that in the past she has had UTIs without dysuria.  She reports she feels as if she has been running a fever.  She has been taking Tylenol without resolution. She does admit to some diarrhea.   The history is provided by the patient.  Flank Pain Pertinent negatives include no abdominal pain and no shortness of breath.    Past Medical History:  Diagnosis Date   Asthma    Bronchitis    Cellulitis of right thigh 09/15/2017   Chlamydia    DUB (dysfunctional uterine bleeding)    Herpes genitalis    Hydrosalpinx    PID (pelvic inflammatory disease)    Pyelonephritis    Trichomonas    Urinary tract infection     Patient Active Problem List   Diagnosis Date Noted   Endometrial polyp 05/29/2019   Trichomoniasis 05/13/2019   Obesity 01/05/2014   Smoker 01/05/2014   Infertility 01/05/2014   Abnormal uterine bleeding (AUB) 12/09/2012    Past Surgical History:  Procedure Laterality Date   ANKLE SURGERY     CESAREAN SECTION     x2   WISDOM TOOTH EXTRACTION      OB History     Gravida  2   Para  2   Term  2   Preterm  0   AB  0   Living  2      SAB  0   IAB  0   Ectopic      Multiple  0   Live Births  2            Home Medications    Prior to Admission medications   Medication Sig Start Date End Date Taking? Authorizing Provider  fluconazole (DIFLUCAN) 150 MG tablet Take one tab PO today and repeat dose in 3 days if symptoms persist 01/03/23  Yes Francene Finders, PA-C  megestrol (MEGACE) 40 MG tablet Take 40 mg by mouth daily.   Yes [provider]  nitrofurantoin, macrocrystal-monohydrate,  (MACROBID) 100 MG capsule Take 1 capsule (100 mg total) by mouth 2 (two) times daily. 01/03/23  Yes Francene Finders, PA-C  chlorhexidine (HIBICLENS) 4 % external liquid Apply topically daily as needed. 01/15/21   Volney American, PA-C  diphenhydrAMINE-zinc acetate [facility] STOPPING) cream Apply topically 3 (three) times daily as needed for itching. 08/25/22   Talbot Grumbling, FNP    Family History Family History  Problem Relation Age of Onset   Anesthesia problems Neg Hx    Other Neg Hx     Social History Social History   Tobacco Use   Smoking status: Former    Packs/day: 0.50    Years: 17.00    Total pack years: 8.50    Types: Cigarettes   Smokeless tobacco: Never  Vaping Use   Vaping Use: Never used  Substance Use Topics   Alcohol use: Yes    Comment: occasionally   Drug use: Not Currently    Types: Marijuana     Allergies   Penicillins   Review of Systems  Review of Systems  Constitutional:  Positive for fever (subjective).  Eyes:  Negative for discharge and redness.  Respiratory:  Negative for shortness of breath.   Gastrointestinal:  Positive for diarrhea and nausea. Negative for abdominal pain and vomiting.  Genitourinary:  Positive for flank pain and frequency. Negative for dysuria.     Physical Exam Triage Vital Signs ED Triage Vitals  Enc Vitals Group     BP 01/03/23 1658 (!) 149/102     Pulse Rate 01/03/23 1658 74     Resp 01/03/23 1658 18     Temp 01/03/23 1658 97.8 F (36.6 C)     Temp Source 01/03/23 1658 Oral     SpO2 01/03/23 1658 98 %     Weight --      Height --      Head Circumference --      Peak Flow --      Pain Score 01/03/23 1700 7     Pain Loc --      Pain Edu? --      Excl. in [place]? --    No data found.  Updated Vital Signs BP (!) 138/92   Pulse 63   Temp 97.8 F (36.6 C) (Oral)   Resp 18   LMP 12/02/2022 (Exact Date)   SpO2 98%       Physical Exam Vitals and nursing note reviewed.  Constitutional:       General: She is not in acute distress.    Appearance: Normal appearance. She is not ill-appearing.  HENT:     Head: Normocephalic and atraumatic.  Eyes:     Conjunctiva/sclera: Conjunctivae normal.  Cardiovascular:     Rate and Rhythm: Normal rate.  Pulmonary:     Effort: Pulmonary effort is normal. No respiratory distress.  Musculoskeletal:     Comments: No TTP to midline spine, mild TTP across low back/ flank area  Neurological:     Mental Status: She is alert.  Psychiatric:        Mood and Affect: Mood normal.        Behavior: Behavior normal.        Thought Content: Thought content normal.      UC Treatments / Results  Labs (all labs ordered are listed, but only abnormal results are displayed) Labs Reviewed  POCT URINALYSIS DIPSTICK, ED / UC - Abnormal; Notable for the following components:      Result Value   Bilirubin Urine SMALL (*)    Ketones, ur TRACE (*)    Hgb urine dipstick MODERATE (*)    Protein, ur 30 (*)    All other components within normal limits  URINE CULTURE  POC URINE PREG, ED    EKG   Radiology No results found.  Procedures Procedures (including critical care time)  Medications Ordered in UC Medications  ondansetron (ZOFRAN-ODT) disintegrating tablet 4 mg (4 mg Oral Given 01/03/23 1704)    Initial Impression / Assessment and Plan / UC Course  I have reviewed the triage vital signs and the nursing notes.  Pertinent labs & imaging results that were available during my care of the patient were reviewed by me and considered in my medical decision making (see chart for details).    Given reported history of UTI with similar presentation seems reasonable to treat to cover while awaiting culture results. Patient requested diflucan for yeast coverage after antibiotic therapy- diflucan prescribed. Discussed possible alternative diagnosis including gastroenteritis, dehydration (given urine results) and/or muscular pain given  TTP. Recommended  follow up if no gradual improvement or with any further concerns.   Final Clinical Impressions(s) / UC Diagnoses   Final diagnoses:  Flank pain   Discharge Instructions   None    ED Prescriptions     Medication Sig Dispense Auth. Provider   nitrofurantoin, macrocrystal-monohydrate, (MACROBID) 100 MG capsule Take 1 capsule (100 mg total) by mouth 2 (two) times daily. 10 capsule Francene Finders, PA-C   fluconazole (DIFLUCAN) 150 MG tablet Take one tab PO today and repeat dose in 3 days if symptoms persist 2 tablet Francene Finders, PA-C      PDMP not reviewed this encounter.   Francene Finders, PA-C 01/03/23 1749

## 2023-01-05 ENCOUNTER — Telehealth (HOSPITAL_COMMUNITY): Payer: Self-pay | Admitting: Emergency Medicine

## 2023-01-05 LAB — URINE CULTURE: Culture: >=100000 — AB

## 2023-01-05 MED ORDER — SULFAMETHOXAZOLE-TRIMETHOPRIM 800-160 MG PO TABS: 1.0000 | ORAL_TABLET | Freq: Two times a day (BID) | ORAL | 0 refills | Status: AC

## 2023-08-26 ENCOUNTER — Emergency Department (HOSPITAL_COMMUNITY)
Admission: EM | Admit: 2023-08-26 | Discharge: 2023-08-26 | Disposition: A | Discharge status: Home / Self Care | Payer: Medicaid Other | Attending: Emergency Medicine | Admitting: Emergency Medicine

## 2023-08-26 ENCOUNTER — Encounter (HOSPITAL_COMMUNITY): Payer: Self-pay | Admitting: Emergency Medicine

## 2023-08-26 ENCOUNTER — Ambulatory Visit (HOSPITAL_COMMUNITY)
Admission: EM | Admit: 2023-08-26 | Discharge: 2023-08-26 | Disposition: A | Discharge status: Home / Self Care | Payer: Medicaid Other | Attending: Family Medicine | Admitting: Family Medicine

## 2023-08-26 ENCOUNTER — Other Ambulatory Visit: Payer: Self-pay

## 2023-08-26 ENCOUNTER — Encounter (HOSPITAL_COMMUNITY): Payer: Self-pay

## 2023-08-26 DIAGNOSIS — D367 Benign neoplasm of other specified sites: Secondary | ICD-10-CM

## 2023-08-26 DIAGNOSIS — L732 Hidradenitis suppurativa: Secondary | ICD-10-CM | POA: Diagnosis not present

## 2023-08-26 DIAGNOSIS — M7918 Myalgia, other site: Secondary | ICD-10-CM

## 2023-08-26 DIAGNOSIS — L02416 Cutaneous abscess of left lower limb: Secondary | ICD-10-CM | POA: Insufficient documentation

## 2023-08-26 DIAGNOSIS — L02415 Cutaneous abscess of right lower limb: Secondary | ICD-10-CM | POA: Diagnosis present

## 2023-08-26 DIAGNOSIS — L0291 Cutaneous abscess, unspecified: Secondary | ICD-10-CM

## 2023-08-26 MED ORDER — KETOROLAC TROMETHAMINE 30 MG/ML IJ SOLN
30.0000 mg | Freq: Once | INTRAMUSCULAR | Status: AC
  Administered 2023-08-26: 30 mg via INTRAMUSCULAR
  Filled 2023-08-26: qty 1

## 2023-08-26 MED ORDER — IBUPROFEN 600 MG PO TABS: 600.0000 mg | ORAL_TABLET | Freq: Four times a day (QID) | ORAL | 0 refills | Status: DC | PRN

## 2023-08-26 MED ORDER — HYDROCODONE-ACETAMINOPHEN 5-325 MG PO TABS: 1.0000 | ORAL_TABLET | ORAL | 0 refills | Status: DC | PRN

## 2023-08-26 MED ORDER — LIDOCAINE-EPINEPHRINE (PF) 2 %-1:200000 IJ SOLN
10.0000 mL | Freq: Once | INTRAMUSCULAR | Status: AC
  Administered 2023-08-26: 10 mL
  Filled 2023-08-26: qty 20

## 2023-08-26 MED ORDER — DOXYCYCLINE HYCLATE 100 MG PO CAPS: 100.0000 mg | ORAL_CAPSULE | Freq: Two times a day (BID) | ORAL | 0 refills | Status: DC

## 2023-08-26 MED ORDER — DOXYCYCLINE HYCLATE 100 MG PO TABS
100.0000 mg | ORAL_TABLET | Freq: Once | ORAL | Status: AC
  Administered 2023-08-26: 100 mg via ORAL
  Filled 2023-08-26: qty 1

## 2023-08-26 MED ORDER — GABAPENTIN 300 MG PO CAPS: 300.0000 mg | ORAL_CAPSULE | Freq: Every day | ORAL | 0 refills | Status: DC

## 2023-08-26 NOTE — ED Provider Notes (Signed)
[facility]    CSN: [id number] Arrival date & time: 08/26/23  1051      History   Chief Complaint Chief Complaint  Patient presents with   Rash    Skin growth    HPI Rebecca Flynn is a 42 y.o. female.   Rebecca Flynn is a 42 year old female who presents with pain in the left buttock and right anterior proximal thigh.  Several weeks ago she had a biopsy done of the area in the left buttock which she was told was negative for anything concerning.  However she is now having some sharp, shooting, pain when sitting on the area and it feels hard.  She also has an area on the right inner groin that is swollen but is not draining purulence, not warm to the touch.  She does have a history of hidradenitis suppurativa which involves the armpits more than the groin.  She is not currently having any fevers or chills.   The history is provided by the patient.  Rash Associated symptoms: no diarrhea, no fatigue, no fever, no nausea and not vomiting     Past Medical History:  Diagnosis Date   Asthma    Bronchitis    Cellulitis of right thigh 09/15/2017   Chlamydia    DUB (dysfunctional uterine bleeding)    Herpes genitalis    Hydrosalpinx    PID (pelvic inflammatory disease)    Pyelonephritis    Trichomonas    Urinary tract infection     Patient Active Problem List   Diagnosis Date Noted   Endometrial polyp 05/29/2019   Trichomoniasis 05/13/2019   Obesity 01/05/2014   Smoker 01/05/2014   Infertility 01/05/2014   Abnormal uterine bleeding (AUB) 12/09/2012    Past Surgical History:  Procedure Laterality Date   ANKLE SURGERY     CESAREAN SECTION     x2   WISDOM TOOTH EXTRACTION      OB History     Gravida  2   Para  2   Term  2   Preterm  0   AB  0   Living  2      SAB  0   IAB  0   Ectopic      Multiple  0   Live Births  2            Home Medications    Prior to Admission medications   Medication Sig Start Date End Date Taking?  Authorizing Provider  gabapentin (NEURONTIN) 300 MG capsule Take 1 capsule (300 mg total) by mouth at bedtime for 14 days. 08/26/23 09/09/23 Yes Ivor Messier, MD  chlorhexidine (HIBICLENS) 4 % external liquid Apply topically daily as needed. Patient not taking: Reported on 08/26/2023 01/15/21   Particia Nearing, PA-C  diphenhydrAMINE-zinc acetate [facility] STOPPING) cream Apply topically 3 (three) times daily as needed for itching. Patient not taking: Reported on 08/26/2023 08/25/22   Carlisle Beers, FNP  fluconazole (DIFLUCAN) 150 MG tablet Take one tab PO today and repeat dose in 3 days if symptoms persist Patient not taking: Reported on 08/26/2023 01/03/23   Tomi Bamberger, PA-C  megestrol (MEGACE) 40 MG tablet Take 40 mg by mouth daily.    [provider]  nitrofurantoin, macrocrystal-monohydrate, (MACROBID) 100 MG capsule Take 1 capsule (100 mg total) by mouth 2 (two) times daily. Patient not taking: Reported on 08/26/2023 01/03/23   Tomi Bamberger, PA-C    Family History Family History  Problem  Relation Age of Onset   Anesthesia problems Neg Hx    Other Neg Hx     Social History Social History   Tobacco Use   Smoking status: Former    Current packs/day: 0.50    Average packs/day: 0.5 packs/day for 17.0 years (8.5 ttl pk-yrs)    Types: Cigarettes   Smokeless tobacco: Never  Vaping Use   Vaping status: Never Used  Substance Use Topics   Alcohol use: Yes    Comment: occasionally   Drug use: Not Currently    Types: Marijuana     Allergies   Penicillins   Review of Systems Review of Systems  Constitutional:  Negative for appetite change, chills, fatigue and fever.  Gastrointestinal:  Negative for anal bleeding, diarrhea, nausea, rectal pain and vomiting.  Genitourinary:  Negative for genital sores and vaginal pain.  Skin:  Positive for rash. Negative for wound.  Neurological:  Negative for numbness.     Physical Exam Triage  Vital Signs ED Triage Vitals  Encounter Vitals Group     BP 08/26/23 1144 (!) 138/90     Systolic BP Percentile --      Diastolic BP Percentile --      Pulse Rate 08/26/23 1144 77     Resp 08/26/23 1144 16     Temp 08/26/23 1144 98.3 F (36.8 C)     Temp Source 08/26/23 1144 Oral     SpO2 08/26/23 1144 99 %     Weight --      Height --      Head Circumference --      Peak Flow --      Pain Score 08/26/23 1142 5     Pain Loc --      Pain Education --      Exclude from Growth Chart --    No data found.  Updated Vital Signs BP (!) 138/90 (BP Location: Right Arm)   Pulse 77   Temp 98.3 F (36.8 C) (Oral)   Resp 16   LMP 08/01/2023 (Approximate)   SpO2 99%   Visual Acuity Right Eye Distance:   Left Eye Distance:   Bilateral Distance:    Right Eye Near:   Left Eye Near:    Bilateral Near:     Physical Exam Vitals reviewed.  Constitutional:      Appearance: Normal appearance. She is obese. She is not ill-appearing, toxic-appearing or diaphoretic.  HENT:     Head: Normocephalic and atraumatic.  Eyes:     Extraocular Movements: Extraocular movements intact.  Genitourinary:    General: Normal vulva.     Rectum: Normal.  Skin:    Comments: There is a soft, nonfluctuant, cystic area over the right inner proximal thigh.  He is nontender to touch and no warmth palpated.  There is no purulence or surrounding erythema. There is a hard, nodular, dermal, cystic structure in the skin of the medial buttock 5 cm from the rectum on the left/8 o'clock position.  There is no overlying erythema, drainage, warmth, fluctuance, and is only tender to deep palpation but not superficial.  Neurological:     General: No focal deficit present.     Mental Status: She is alert.  Psychiatric:        Mood and Affect: Mood normal.        Behavior: Behavior normal.      UC Treatments / Results  Labs (all labs ordered are listed, but only abnormal results are displayed) Labs  Reviewed - No  data to display  EKG   Radiology No results found.  Procedures Procedures (including critical care time)  Medications Ordered in UC Medications - No data to display  Initial Impression / Assessment and Plan / UC Course  I have reviewed the triage vital signs and the nursing notes.  Pertinent labs & imaging results that were available during my care of the patient were reviewed by me and considered in my medical decision making (see chart for details).     Dermal cysts -The area in the left buttock skin close to the rectum appears to be a dermal fibrous cyst versus lymph node.  The patient reports having a biopsy done several weeks ago which was negative.  The area itself does not hurt but when sitting it causes sharp shooting pain.  This is likely due to mass effect and nerve impingement. - We discussed the options for pain management and after joint decision-making the patient would like to trial a short course of gabapentin for nerve pain. - I explained that unfortunately, the area of the cyst as well as her history of hidradenitis suppurativa make this a high risk area for surgical excision. - I recommended follow-up with general surgery as they will have better capability for the procedure as well as any complications that might arise - Referral information given - She does have a cystic area in the right proximal groin as well, but this does not look infectious currently.  I recommended continuing warm compresses and cleansing as she uses in the armpits with avoidance of the genital area. - No indication for antibiotics currently. - We discussed monitoring for signs of an infectious progression. - We also discussed the benefit of weight loss on her chronic condition. - The patient voiced understanding and agreement with plan.   Final Clinical Impressions(s) / UC Diagnoses   Final diagnoses:  Dermoid cyst of lower extremity, unspecified laterality  Hydradenitis  Pain in  left buttock     Discharge Instructions      Continue your clensing routine for hydradenitis suppurativa including the groin Monitor for signs of infection. Your cystic structures are not currently infectious Follow-up with general surgery for excision of cyst in the left gluteus region as this is a high risk procedure. Start gabapentin nightly for nerve pain and monitor for side effects. Continue warm compresses     ED Prescriptions     Medication Sig Dispense Auth. Provider   gabapentin (NEURONTIN) 300 MG capsule Take 1 capsule (300 mg total) by mouth at bedtime for 14 days. 14 capsule Ivor Messier, MD      PDMP not reviewed this encounter.   Ivor Messier, MD 08/26/23 [phone]

## 2023-08-26 NOTE — ED Notes (Signed)
2 areas incised and drained by Dr. Particia Nearing. Pt tolerated procedure well. Discussed after care.

## 2023-08-26 NOTE — ED Triage Notes (Signed)
Pt states she has skin growth on buttocks that she has had for "awhile". It has started to hurt her this week when sitting.  She also states she has a boil on right inner thigh that is becoming bothersome.

## 2023-08-26 NOTE — ED Triage Notes (Signed)
Pt has had a swollen area and pain in left buttock for several days. Pt went to UC where she was diagnosed with hydradenitis suppurativa and was referred to a general surgeon but the surgeon is not in her insurance network so they told her to come to the ED. Pt c/o pain in buttock at this time. Pt denies fevers.

## 2023-08-26 NOTE — ED Provider Triage Note (Signed)
Emergency Medicine Provider Triage Evaluation Note  Rebecca Flynn , a 42 y.o. female  was evaluated in triage.  Pt complains of perianal cyst and pain by labia. Seen at [facility], referred to [facility]- they are out of network. Came here for further eval .  Review of Systems  Positive: Pain  Negative: fever  Physical Exam  BP (!) 153/102 (BP Location: Left Arm)   Pulse 76   Temp 97.6 F (36.4 C) (Oral)   Resp 18   Ht 5\' 4"  (1.626 m)   Wt 98.9 kg   LMP 08/01/2023 (Approximate)   SpO2 100%   BMI 37.42 kg/m  Gen:   Awake, no distress   Resp:  Normal effort  MSK:   Moves extremities without difficulty  Other:    Medical Decision Making  Medically screening exam initiated at 3:02 PM.  Appropriate orders placed.  Rebecca Flynn was informed that the remainder of the evaluation will be completed by another provider, this initial triage assessment does not replace that evaluation, and the importance of remaining in the ED until their evaluation is complete.    Arthor Captain, PA-C 08/26/23 [phone]

## 2023-08-26 NOTE — Discharge Instructions (Addendum)
Continue your clensing routine for hydradenitis suppurativa including the groin Monitor for signs of infection. Your cystic structures are not currently infectious Follow-up with general surgery for excision of cyst in the left gluteus region as this is a high risk procedure. Start gabapentin nightly for nerve pain and monitor for side effects. Continue warm compresses

## 2023-08-27 NOTE — ED Provider Notes (Signed)
[facility] AT [facility] Provider Note   CSN: [id number] Arrival date & time: 08/26/23  1401     History  Chief Complaint  Patient presents with   Cyst    Rebecca Flynn is a 42 y.o. female.  Pt is a 42 yo female with pmhx significant for hidradenitis.  She went to UC with 2 abscesses and there was concern 1 of the abscesses were too close to the rectum to drain.  So, she was sent to surgery.  Pt came here b/c she had a lot of pain and felt like she needed abx.         Home Medications Prior to Admission medications   Medication Sig Start Date End Date Taking? Authorizing Provider  doxycycline (VIBRAMYCIN) 100 MG capsule Take 1 capsule (100 mg total) by mouth 2 (two) times daily. 08/26/23  Yes Jacalyn Lefevre, MD  HYDROcodone-acetaminophen (NORCO/VICODIN) 5-325 MG tablet Take 1 tablet by mouth every 4 (four) hours as needed. 08/26/23  Yes Jacalyn Lefevre, MD  ibuprofen (ADVIL) 600 MG tablet Take 1 tablet (600 mg total) by mouth every 6 (six) hours as needed. 08/26/23  Yes Jacalyn Lefevre, MD  chlorhexidine (HIBICLENS) 4 % external liquid Apply topically daily as needed. Patient not taking: Reported on 08/26/2023 01/15/21   Particia Nearing, PA-C  diphenhydrAMINE-zinc acetate [facility] STOPPING) cream Apply topically 3 (three) times daily as needed for itching. Patient not taking: Reported on 08/26/2023 08/25/22   Carlisle Beers, FNP  fluconazole (DIFLUCAN) 150 MG tablet Take one tab PO today and repeat dose in 3 days if symptoms persist Patient not taking: Reported on 08/26/2023 01/03/23   Tomi Bamberger, PA-C  gabapentin (NEURONTIN) 300 MG capsule Take 1 capsule (300 mg total) by mouth at bedtime for 14 days. 08/26/23 09/09/23  Ivor Messier, MD  megestrol (MEGACE) 40 MG tablet Take 40 mg by mouth daily.    [provider]  nitrofurantoin, macrocrystal-monohydrate, (MACROBID) 100 MG capsule Take 1 capsule (100 mg  total) by mouth 2 (two) times daily. Patient not taking: Reported on 08/26/2023 01/03/23   Tomi Bamberger, PA-C      Allergies    Penicillins    Review of Systems   Review of Systems  Skin:        Abscess times 2  All other systems reviewed and are negative.   Physical Exam Updated Vital Signs BP (!) 162/78   Pulse 70   Temp 97.6 F (36.4 C) (Oral)   Resp 18   Ht 5\' 4"  (1.626 m)   Wt 98.9 kg   LMP 08/01/2023 (Approximate)   SpO2 100%   BMI 37.42 kg/m  Physical Exam Vitals and nursing note reviewed.  Constitutional:      Appearance: Normal appearance. She is obese.  HENT:     Head: Normocephalic and atraumatic.     Right Ear: External ear normal.     Left Ear: External ear normal.     Nose: Nose normal.     Mouth/Throat:     Mouth: Mucous membranes are moist.     Pharynx: Oropharynx is clear.  Eyes:     Extraocular Movements: Extraocular movements intact.     Conjunctiva/sclera: Conjunctivae normal.     Pupils: Pupils are equal, round, and reactive to light.  Cardiovascular:     Rate and Rhythm: Normal rate and regular rhythm.     Pulses: Normal pulses.     Heart sounds: Normal  heart sounds.  Pulmonary:     Effort: Pulmonary effort is normal.     Breath sounds: Normal breath sounds.  Abdominal:     General: Abdomen is flat. Bowel sounds are normal.     Palpations: Abdomen is soft.  Musculoskeletal:        General: Normal range of motion.     Cervical back: Normal range of motion and neck supple.  Skin:    General: Skin is warm.     Capillary Refill: Capillary refill takes less than 2 seconds.     Comments: Abscess to right and left inner thigh.  No rectal involvement.  Neurological:     General: No focal deficit present.     Mental Status: She is alert and oriented to person, place, and time.  Psychiatric:        Mood and Affect: Mood normal.        Behavior: Behavior normal.     ED Results / Procedures / Treatments   Labs (all labs ordered are  listed, but only abnormal results are displayed) Labs Reviewed - No data to display  EKG None  Radiology No results found.  Procedures .Marland KitchenIncision and Drainage  Date/Time: 08/27/2023 3:43 PM  Performed by: Jacalyn Lefevre, MD Authorized by: Jacalyn Lefevre, MD   Consent:    Consent obtained:  Verbal   Risks discussed:  Bleeding   Alternatives discussed:  No treatment Universal protocol:    Patient identity confirmed:  Verbally with patient Location:    Type:  Abscess   Size:  1   Location:  Lower extremity   Lower extremity location:  Leg   Leg location:  L upper leg Sedation:    Sedation type:  None Anesthesia:    Anesthesia method:  Local infiltration   Local anesthetic:  Lidocaine 2% WITH epi Procedure type:    Complexity:  Simple Procedure details:    Incision types:  Single straight   Wound management:  Probed and deloculated   Drainage:  Purulent   Drainage amount:  Moderate   Wound treatment:  Wound left open   Packing materials:  None Post-procedure details:    Procedure completion:  Tolerated well, no immediate complications .Marland KitchenIncision and Drainage  Date/Time: 08/27/2023 4:02 PM  Performed by: Jacalyn Lefevre, MD Authorized by: Jacalyn Lefevre, MD   Consent:    Consent obtained:  Verbal   Consent given by:  Patient   Alternatives discussed:  No treatment Universal protocol:    Patient identity confirmed:  Verbally with patient Location:    Type:  Abscess   Location:  Lower extremity   Lower extremity location:  Leg   Leg location:  R upper leg Pre-procedure details:    Skin preparation:  Povidone-iodine Sedation:    Sedation type:  None Anesthesia:    Anesthesia method:  Local infiltration   Local anesthetic:  Lidocaine 2% WITH epi Procedure type:    Complexity:  Simple Procedure details:    Incision types:  Single straight   Wound management:  Probed and deloculated   Drainage:  Purulent   Drainage amount:  Moderate   Wound  treatment:  Wound left open   Packing materials:  None     Medications Ordered in ED Medications  ketorolac (TORADOL) 30 MG/ML injection 30 mg (30 mg Intramuscular Given 08/26/23 1713)  doxycycline (VIBRA-TABS) tablet 100 mg (100 mg Oral Given 08/26/23 1713)  lidocaine-EPINEPHrine (XYLOCAINE W/EPI) 2 %-1:200000 (PF) injection 10 mL (10 mLs Infiltration Given by Other  08/26/23 1713)    ED Course/ Medical Decision Making/ A&P                                 Medical Decision Making Risk Prescription drug management.   Pt's abscesses drained.  She is d/c with doxy.  She is stable.  Return if worse.         Final Clinical Impression(s) / ED Diagnoses Final diagnoses:  Abscess    Rx / DC Orders ED Discharge Orders          Ordered    doxycycline (VIBRAMYCIN) 100 MG capsule  2 times daily        08/26/23 1753    HYDROcodone-acetaminophen (NORCO/VICODIN) 5-325 MG tablet  Every 4 hours PRN        08/26/23 1753    ibuprofen (ADVIL) 600 MG tablet  Every 6 hours PRN        08/26/23 1753              Jacalyn Lefevre, MD 08/27/23 1603

## 2023-09-01 ENCOUNTER — Encounter (HOSPITAL_BASED_OUTPATIENT_CLINIC_OR_DEPARTMENT_OTHER): Payer: Self-pay

## 2023-09-01 ENCOUNTER — Emergency Department (HOSPITAL_BASED_OUTPATIENT_CLINIC_OR_DEPARTMENT_OTHER): Payer: Medicaid Other | Admitting: Radiology

## 2023-09-01 ENCOUNTER — Emergency Department (HOSPITAL_BASED_OUTPATIENT_CLINIC_OR_DEPARTMENT_OTHER)
Admission: EM | Admit: 2023-09-01 | Discharge: 2023-09-01 | Disposition: A | Discharge status: Home / Self Care | Payer: Medicaid Other

## 2023-09-01 ENCOUNTER — Other Ambulatory Visit (HOSPITAL_BASED_OUTPATIENT_CLINIC_OR_DEPARTMENT_OTHER): Payer: Self-pay

## 2023-09-01 ENCOUNTER — Other Ambulatory Visit: Payer: Self-pay

## 2023-09-01 DIAGNOSIS — M25511 Pain in right shoulder: Secondary | ICD-10-CM | POA: Diagnosis present

## 2023-09-01 DIAGNOSIS — M545 Low back pain, unspecified: Secondary | ICD-10-CM | POA: Diagnosis not present

## 2023-09-01 MED ORDER — IBUPROFEN 600 MG PO TABS
600.0000 mg | ORAL_TABLET | Freq: Four times a day (QID) | ORAL | 0 refills | Status: DC | PRN
  Filled 2023-09-01: qty 20, 5d supply, fill #0

## 2023-09-01 MED ORDER — KETOROLAC TROMETHAMINE 60 MG/2ML IM SOLN
30.0000 mg | Freq: Once | INTRAMUSCULAR | Status: AC
  Administered 2023-09-01: 30 mg via INTRAMUSCULAR
  Filled 2023-09-01: qty 2

## 2023-09-01 MED ORDER — VENTOLIN HFA 108 (90 BASE) MCG/ACT IN AERS
2.0000 | INHALATION_SPRAY | RESPIRATORY_TRACT | 0 refills | Status: DC | PRN
  Filled 2023-09-01: qty 18, 28d supply, fill #0

## 2023-09-01 MED ORDER — METHOCARBAMOL 500 MG PO TABS
1000.0000 mg | ORAL_TABLET | Freq: Three times a day (TID) | ORAL | 0 refills | Status: DC | PRN
  Filled 2023-09-01: qty 30, 5d supply, fill #0

## 2023-09-01 NOTE — ED Notes (Signed)
Was seen Thursday at [facility] for same, given gabapentin to take by [facility] Provider.

## 2023-09-01 NOTE — ED Provider Notes (Signed)
[place] [facility] AT [facility] Provider Note   CSN: [id number] Arrival date & time: 09/01/23  1047     History  Chief Complaint  Patient presents with   Shoulder Pain    Rebecca Flynn is a 42 y.o. female.  Patient presents to the emergency department for evaluation of 1 week of lower back pain and now with shoulder pain.  Pain started in her right lower back area.  It is worse with movement and palpation.  The pain in this area has improved however it moved up into the shoulder and lateral chest wall.  No weakness, numbness, or tingling in the arm.  She denies any falls or injuries.  She was seen in urgent care for this originally and was prescribed gabapentin which she has been taking without improvement.  She cannot lay on that shoulder because of pain.  No shortness of breath or difficulty breathing.  No diaphoresis or vomiting.       Home Medications Prior to Admission medications   Medication Sig Start Date End Date Taking? Authorizing Provider  chlorhexidine (HIBICLENS) 4 % external liquid Apply topically daily as needed. Patient not taking: Reported on 08/26/2023 01/15/21   Particia Nearing, PA-C  diphenhydrAMINE-zinc acetate [facility] STOPPING) cream Apply topically 3 (three) times daily as needed for itching. Patient not taking: Reported on 08/26/2023 08/25/22   Carlisle Beers, FNP  doxycycline (VIBRAMYCIN) 100 MG capsule Take 1 capsule (100 mg total) by mouth 2 (two) times daily. 08/26/23   Jacalyn Lefevre, MD  fluconazole (DIFLUCAN) 150 MG tablet Take one tab PO today and repeat dose in 3 days if symptoms persist Patient not taking: Reported on 08/26/2023 01/03/23   Tomi Bamberger, PA-C  gabapentin (NEURONTIN) 300 MG capsule Take 1 capsule (300 mg total) by mouth at bedtime for 14 days. 08/26/23 09/09/23  Ivor Messier, MD  HYDROcodone-acetaminophen (NORCO/VICODIN) 5-325 MG tablet Take 1 tablet by mouth every 4 (four) hours  as needed. 08/26/23   Jacalyn Lefevre, MD  ibuprofen (ADVIL) 600 MG tablet Take 1 tablet (600 mg total) by mouth every 6 (six) hours as needed. 08/26/23   Jacalyn Lefevre, MD  megestrol (MEGACE) 40 MG tablet Take 40 mg by mouth daily.    [provider]  nitrofurantoin, macrocrystal-monohydrate, (MACROBID) 100 MG capsule Take 1 capsule (100 mg total) by mouth 2 (two) times daily. Patient not taking: Reported on 08/26/2023 01/03/23   Tomi Bamberger, PA-C      Allergies    Penicillins    Review of Systems   Review of Systems  Physical Exam Updated Vital Signs BP (!) 137/91 (BP Location: Left Arm)   Pulse 60   Temp 98.4 F (36.9 C)   Resp 20   Ht 5\' 4"  (1.626 m)   Wt 97.1 kg   LMP 08/01/2023 (Approximate)   SpO2 99%   BMI 36.73 kg/m   Physical Exam Vitals and nursing note reviewed.  Constitutional:      Appearance: She is well-developed.  HENT:     Head: Normocephalic and atraumatic.  Eyes:     Pupils: Pupils are equal, round, and reactive to light.  Cardiovascular:     Pulses: Normal pulses. No decreased pulses.          Radial pulses are 2+ on the right side and 2+ on the left side.  Musculoskeletal:        General: Tenderness present.     Right shoulder: Tenderness present.  No bony tenderness. Normal range of motion.     Left shoulder: No tenderness or bony tenderness. Normal range of motion.     Cervical back: Normal range of motion and neck supple. No tenderness. Normal range of motion.     Thoracic back: No tenderness. Normal range of motion.       Back:  Skin:    General: Skin is warm and dry.  Neurological:     Mental Status: She is alert.     Sensory: No sensory deficit.     Comments: Motor, sensation, and vascular distal to the injury is fully intact.   Psychiatric:        Mood and Affect: Mood normal.     ED Results / Procedures / Treatments   Labs (all labs ordered are listed, but only abnormal results are displayed) Labs Reviewed - No  data to display  ED ECG REPORT   Date: 09/01/2023  Rate: 49  Rhythm: sinus bradycardia  QRS Axis: normal  Intervals: normal  ST/T Wave abnormalities: normal  Conduction Disutrbances:none  Narrative Interpretation:   Old EKG Reviewed: none available  I have personally reviewed the EKG tracing and agree with the computerized printout as noted.    Radiology DG Shoulder Right  Result Date: 09/01/2023 CLINICAL DATA:  Right shoulder pain.  No known injury. EXAM: RIGHT SHOULDER - 2+ VIEW COMPARISON:  None Available. FINDINGS: No acute fracture or dislocation. No aggressive osseous lesion. Glenohumeral and acromioclavicular joints are normal in alignment. No significant arthritis. No soft tissue swelling. No radiopaque foreign bodies. IMPRESSION: *No acute fracture or dislocation. Electronically Signed   By: Jules Schick M.D.   On: 09/01/2023 14:30    Procedures Procedures    Medications Ordered in ED Medications  ketorolac (TORADOL) injection 30 mg (30 mg Intramuscular Given 09/01/23 1208)    ED Course/ Medical Decision Making/ A&P    Patient seen and examined. History obtained directly from patient.   Labs/EKG: None ordered  Imaging: Ordered x-ray of the shoulder, EKG.  Medications/Fluids: Ordered: IM Toradol.   Most recent vital signs reviewed and are as follows: BP (!) 137/91 (BP Location: Left Arm)   Pulse 60   Temp 98.4 F (36.9 C)   Resp 20   Ht 5\' 4"  (1.626 m)   Wt 97.1 kg   LMP 08/01/2023 (Approximate)   SpO2 99%   BMI 36.73 kg/m   Initial impression: Musculoskeletal back pain and shoulder pain  3:11 PM Reassessment performed. Patient appears stable.  She states that she slept for a while.  Imaging personally visualized and interpreted including: X-ray of the shoulder, agree negative.  Reviewed pertinent lab work and imaging with patient at bedside. Questions answered.   Most current vital signs reviewed and are as follows: BP (!) 134/100   Pulse  72   Temp 98.4 F (36.9 C)   Resp 20   Ht 5\' 4"  (1.626 m)   Wt 97.1 kg   LMP 08/01/2023 (Approximate)   SpO2 99%   BMI 36.73 kg/m   Plan: Discharge to home.   Prescriptions written for: Robaxin, ibuprofen.  Patient requesting prescription for albuterol inhaler refill.  Other home care instructions discussed: RICE protocol  ED return instructions discussed: New or worsening symptoms, chest pain, shortness of breath.  Follow-up instructions discussed: Patient encouraged to follow-up with their PCP or ortho referral in 7 days.  Medical Decision Making Amount and/or Complexity of Data Reviewed Radiology: ordered.  Risk Prescription drug management.   Patient with right lower back pain which has now moved to her right shoulder area.  Worse with movement and palpation.  Exam suggestive of musculoskeletal etiology.  X-rays negative for fracture or dislocation.  Upper extremity is neurovascularly intact.  EKG with sinus bradycardia, however patient not persistently bradycardic here.  Low concern for ACS, PE, pneumonia.  Will prescribe Robaxin and ibuprofen.  Patient will discontinue previously prescribed gabapentin and hydrocodone.        Final Clinical Impression(s) / ED Diagnoses Final diagnoses:  Acute pain of right shoulder    Rx / DC Orders ED Discharge Orders          Ordered    ibuprofen (ADVIL) 600 MG tablet  Every 6 hours PRN        09/01/23 1513    methocarbamol (ROBAXIN) 500 MG tablet  Every 8 hours PRN        09/01/23 1513    albuterol (VENTOLIN HFA) 108 (90 Base) MCG/ACT inhaler  Every 4 hours PRN        09/01/23 1513              Renne Crigler, PA-C 09/01/23 1516    Coral Spikes, DO 09/02/23 0710

## 2023-09-01 NOTE — ED Triage Notes (Signed)
Pt c/o right lower back pain starting last Thursday that radiates up into right shoulder now. Reports painful to lay on. Denies injury. Full ROM but painful.   Ambulatory to room, in NAD, CA&Ox4.

## 2023-09-01 NOTE — Discharge Instructions (Addendum)
Please read and follow all provided instructions.  Your diagnoses today include:  1. Acute pain of right shoulder     Tests performed today include: An x-ray of the affected area - does NOT show any broken bones EKG - no abnormal heart rhythms Vital signs. See below for your results today.   Medications prescribed:  Robaxin (methocarbamol) - muscle relaxer medication  DO NOT drive or perform any activities that require you to be awake and alert because this medicine can make you drowsy.   Ibuprofen (Motrin, Advil) - anti-inflammatory pain medication Do not exceed 600mg  ibuprofen every 6 hours, take with food  You have been prescribed an anti-inflammatory medication or NSAID. Take with food. Take smallest effective dose for the shortest duration needed for your pain. Stop taking if you experience stomach pain or vomiting.   Albuterol inhaler - medication that opens up your airway  Use inhaler as follows: 1-2 puffs with spacer every 4 hours as needed for wheezing, cough, or shortness of breath.   Take any prescribed medications only as directed.  Home care instructions:  Follow any educational materials contained in this packet Follow R.I.C.E. Protocol: R - rest your injury  I  - use ice on injury without applying directly to skin C - compress injury with bandage or splint E - elevate the injury as much as possible  Follow-up instructions: Please follow-up with your primary care provider or the provided orthopedic physician (bone specialist) if you continue to have significant pain in 1 week. In this case you may have a more severe injury that requires further care.   Return instructions:  Please return if your fingers are numb or tingling, appear gray or blue, or you have severe pain (also elevate the arm and loosen splint or wrap if you were given one) Please return to the Emergency Department if you experience worsening symptoms.  Please return if you have any other emergent  concerns.  Additional Information:  Your vital signs today were: BP (!) 134/100   Pulse 72   Temp 98.4 F (36.9 C)   Resp 20   Ht 5\' 4"  (1.626 m)   Wt 97.1 kg   LMP 08/01/2023 (Approximate)   SpO2 99%   BMI 36.73 kg/m  If your blood pressure (BP) was elevated above 135/85 this visit, please have this repeated by your doctor within one month. --------------

## 2023-12-01 ENCOUNTER — Ambulatory Visit (HOSPITAL_COMMUNITY)
Admission: RE | Admit: 2023-12-01 | Discharge: 2023-12-01 | Disposition: A | Discharge status: Home / Self Care | Payer: Medicaid Other | Source: Ambulatory Visit | Attending: Emergency Medicine | Admitting: Emergency Medicine

## 2023-12-01 VITALS — BP 143/90 | HR 83 | Temp 98.0°F | Resp 16

## 2023-12-01 DIAGNOSIS — L02412 Cutaneous abscess of left axilla: Secondary | ICD-10-CM

## 2023-12-01 MED ORDER — FLUCONAZOLE 150 MG PO TABS: ORAL_TABLET | ORAL | 0 refills | Status: DC

## 2023-12-01 MED ORDER — CLINDAMYCIN PHOSPHATE 1 % EX GEL: Freq: Two times a day (BID) | CUTANEOUS | 0 refills | Status: DC

## 2023-12-01 MED ORDER — DOXYCYCLINE HYCLATE 100 MG PO CAPS: 100.0000 mg | ORAL_CAPSULE | Freq: Two times a day (BID) | ORAL | 0 refills | Status: DC

## 2023-12-01 MED ORDER — LIDOCAINE HCL 2 % IJ SOLN
INTRAMUSCULAR | Status: AC
  Filled 2023-12-01: qty 20

## 2023-12-01 NOTE — Discharge Instructions (Addendum)
 We have incised and drained an abscess in your left axilla.  The area may continue to drain some, this is normal.  Take the antibiotics twice daily with food.  Take the Diflucan  on day 3 of antibiotics and then on the last day to help prevent vaginal yeast infections.  You can also add a probiotic supplement with lactobacillus to your diet or eat active culture yogurt such as [place] yogurt.  Avoid applying deodorant or shaving to the area fully heals.  I would suggest spray deodorant.  Once the area heals you can use the clindamycin  gel to help prevent bacteria buildup in the skin.  And her milligrams of ibuprofen  every 8 hours can help with pain and inflammation.  Return to clinic for any new or urgent symptoms.

## 2023-12-01 NOTE — ED Provider Notes (Signed)
 [facility]    CSN: [id number] Arrival date & time: 12/01/23  1736      History   Chief Complaint Chief Complaint  Patient presents with   Cyst    HPI Rebecca Flynn is a 43 y.o. female.   Patient presents to clinic over concern for an abscess in her left axilla that has been present for the past 3 days.  She has been using warm compresses and squeezing the area, has not had any drainage.  She has not had any fevers, reports overall she feels sick and knows that there is an infection in her body.  Does have a history of at bedtime.  Uses Hibiclens  daily in the shower to her axilla.  The history is provided by the patient and medical records.    Past Medical History:  Diagnosis Date   Asthma    Bronchitis    Cellulitis of right thigh 09/15/2017   Chlamydia    DUB (dysfunctional uterine bleeding)    Herpes genitalis    Hydrosalpinx    PID (pelvic inflammatory disease)    Pyelonephritis    Trichomonas    Urinary tract infection     Patient Active Problem List   Diagnosis Date Noted   Endometrial polyp 05/29/2019   Trichomoniasis 05/13/2019   Obesity 01/05/2014   Smoker 01/05/2014   Infertility 01/05/2014   Abnormal uterine bleeding (AUB) 12/09/2012    Past Surgical History:  Procedure Laterality Date   ANKLE SURGERY     CESAREAN SECTION     x2   WISDOM TOOTH EXTRACTION      OB History     Gravida  2   Para  2   Term  2   Preterm  0   AB  0   Living  2      SAB  0   IAB  0   Ectopic      Multiple  0   Live Births  2            Home Medications    Prior to Admission medications   Medication Sig Start Date End Date Taking? Authorizing Provider  clindamycin  (CLINDAGEL) 1 % gel Apply topically 2 (two) times daily. 12/01/23  Yes Eliya Geiman  N, FNP  doxycycline  (VIBRAMYCIN ) 100 MG capsule Take 1 capsule (100 mg total) by mouth 2 (two) times daily. 12/01/23  Yes Servando Kyllonen  N, FNP  fluconazole  (DIFLUCAN ) 150 MG  tablet Take 1 tablet on day 3 of antibiotics and 1 tablet on the last day of antibiotics. 12/01/23  Yes Lorraine Cimmino  N, FNP  albuterol  (VENTOLIN  HFA) 108 (90 Base) MCG/ACT inhaler Inhale 2 puffs into the lungs every 4 (four) hours as needed for wheezing or shortness of breath. 09/01/23   Geiple, Joshua, PA-C  ibuprofen  (ADVIL ) 600 MG tablet Take 1 tablet (600 mg total) by mouth every 6 (six) hours as needed. 09/01/23   Geiple, Joshua, PA-C  methocarbamol  (ROBAXIN ) 500 MG tablet Take 2 tablets (1,000 mg total) by mouth every 8 (eight) hours as needed for muscle spasms. 09/01/23   Lyna Sandhoff, PA-C    Family History Family History  Problem Relation Age of Onset   Anesthesia problems Neg Hx    Other Neg Hx     Social History Social History   Tobacco Use   Smoking status: Former    Current packs/day: 0.50    Average packs/day: 0.5 packs/day for 17.0 years (8.5 ttl pk-yrs)    Types:  Cigarettes   Smokeless tobacco: Never  Vaping Use   Vaping status: Never Used  Substance Use Topics   Alcohol use: Yes    Comment: occasionally   Drug use: Not Currently    Types: Marijuana     Allergies   Penicillins   Review of Systems Review of Systems  Per HPI   Physical Exam Triage Vital Signs ED Triage Vitals  Encounter Vitals Group     BP 12/01/23 1827 (!) 143/90     Systolic BP Percentile --      Diastolic BP Percentile --      Pulse Rate 12/01/23 1827 83     Resp 12/01/23 1827 16     Temp 12/01/23 1827 98 F (36.7 C)     Temp Source 12/01/23 1827 Oral     SpO2 12/01/23 1827 99 %     Weight --      Height --      Head Circumference --      Peak Flow --      Pain Score 12/01/23 1829 5     Pain Loc --      Pain Education --      Exclude from Growth Chart --    No data found.  Updated Vital Signs BP (!) 143/90 (BP Location: Left Arm)   Pulse 83   Temp 98 F (36.7 C) (Oral)   Resp 16   LMP 11/22/2023 (Approximate)   SpO2 99%   Visual Acuity Right Eye  Distance:   Left Eye Distance:   Bilateral Distance:    Right Eye Near:   Left Eye Near:    Bilateral Near:     Physical Exam Vitals and nursing note reviewed.  Constitutional:      Appearance: Normal appearance.  HENT:     Head: Normocephalic and atraumatic.     Right Ear: External ear normal.     Left Ear: External ear normal.     Nose: Nose normal.     Mouth/Throat:     Mouth: Mucous membranes are moist.  Eyes:     Conjunctiva/sclera: Conjunctivae normal.  Cardiovascular:     Rate and Rhythm: Normal rate.  Pulmonary:     Effort: Pulmonary effort is normal. No respiratory distress.  Musculoskeletal:        General: Normal range of motion.  Skin:    General: Skin is warm and dry.     Findings: Abscess present.          Comments: Fluctuant 3 cm x 1 cm abscess to the left axilla.  Neurological:     General: No focal deficit present.     Mental Status: She is alert and oriented to person, place, and time.  Psychiatric:        Mood and Affect: Mood normal.        Behavior: Behavior normal. Behavior is cooperative.      UC Treatments / Results  Labs (all labs ordered are listed, but only abnormal results are displayed) Labs Reviewed - No data to display  EKG   Radiology No results found.  Procedures Incision and Drainage  Date/Time: 12/01/2023 7:08 PM  Performed by: Marisue Side, FNP Authorized by: Harlow Lighter, Havier Deeb  N, FNP   Consent:    Consent obtained:  Verbal   Consent given by:  Patient   Risks, benefits, and alternatives were discussed: yes     Risks discussed:  Bleeding, pain and incomplete drainage   Alternatives discussed:  No  treatment Universal protocol:    Procedure explained and questions answered to patient or proxy's satisfaction: yes     Patient identity confirmed:  Verbally with patient Location:    Type:  Abscess   Size:  4cm x 2cm   Location:  Trunk   Trunk location: L axilla. Pre-procedure details:    Skin preparation:   Antiseptic wash Anesthesia:    Anesthesia method:  Local infiltration   Local anesthetic:  Lidocaine  2% w/o epi Procedure type:    Complexity:  Simple Procedure details:    Incision types:  Single straight   Drainage:  Bloody and purulent   Drainage amount:  Moderate   Wound treatment:  Wound left open   Packing materials:  None Post-procedure details:    Procedure completion:  Tolerated well, no immediate complications  (including critical care time)  Medications Ordered in UC Medications - No data to display  Initial Impression / Assessment and Plan / UC Course  I have reviewed the triage vital signs and the nursing notes.  Pertinent labs & imaging results that were available during my care of the patient were reviewed by me and considered in my medical decision making (see chart for details).  Vitals and triage reviewed, patient is hemodynamically stable.  Fluctuant abscess to the left axilla.  Area cleaned, numbed incised and drained.  See procedure note for further details.  Patient tolerated well.  Will place on doxycycline .  Advise clindamycin  gel in the future to help prevent further abscesses, discussed difficulty with at bedtime.  Will send in Diflucan  empirically due to yeast infections with antibiotics.  Plan of care, follow-up care return precautions given, no questions at this time.     Final Clinical Impressions(s) / UC Diagnoses   Final diagnoses:  Abscess of left axilla     Discharge Instructions      We have incised and drained an abscess in your left axilla.  The area may continue to drain some, this is normal.  Take the antibiotics twice daily with food.  Take the Diflucan  on day 3 of antibiotics and then on the last day to help prevent vaginal yeast infections.  You can also add a probiotic supplement with lactobacillus to your diet or eat active culture yogurt such as [place] yogurt.  Avoid applying deodorant or shaving to the area fully heals.  I would  suggest spray deodorant.  Once the area heals you can use the clindamycin  gel to help prevent bacteria buildup in the skin.  And her milligrams of ibuprofen  every 8 hours can help with pain and inflammation.  Return to clinic for any new or urgent symptoms.       ED Prescriptions     Medication Sig Dispense Auth. Provider   clindamycin  (CLINDAGEL) 1 % gel Apply topically 2 (two) times daily. 30 g Harlow Lighter, Advait Buice  N, FNP   doxycycline  (VIBRAMYCIN ) 100 MG capsule Take 1 capsule (100 mg total) by mouth 2 (two) times daily. 20 capsule Harlow Lighter, Monty Spicher  N, FNP   fluconazole  (DIFLUCAN ) 150 MG tablet Take 1 tablet on day 3 of antibiotics and 1 tablet on the last day of antibiotics. 2 tablet Emry Harrier, FNP      PDMP not reviewed this encounter.   Carlee Charters, FNP 12/01/23 1909

## 2023-12-01 NOTE — ED Triage Notes (Signed)
 Patient presents to the office for left arm pit cyst x 2 days.

## 2024-01-22 ENCOUNTER — Emergency Department (HOSPITAL_BASED_OUTPATIENT_CLINIC_OR_DEPARTMENT_OTHER)
Admission: EM | Admit: 2024-01-22 | Discharge: 2024-01-22 | Disposition: A | Discharge status: Home / Self Care | Attending: Emergency Medicine | Admitting: Emergency Medicine

## 2024-01-22 ENCOUNTER — Encounter (HOSPITAL_BASED_OUTPATIENT_CLINIC_OR_DEPARTMENT_OTHER): Payer: Self-pay | Admitting: *Deleted

## 2024-01-22 ENCOUNTER — Emergency Department (HOSPITAL_BASED_OUTPATIENT_CLINIC_OR_DEPARTMENT_OTHER)

## 2024-01-22 ENCOUNTER — Other Ambulatory Visit: Payer: Self-pay

## 2024-01-22 DIAGNOSIS — R718 Other abnormality of red blood cells: Secondary | ICD-10-CM | POA: Insufficient documentation

## 2024-01-22 DIAGNOSIS — N854 Malposition of uterus: Secondary | ICD-10-CM | POA: Diagnosis not present

## 2024-01-22 DIAGNOSIS — R519 Headache, unspecified: Secondary | ICD-10-CM | POA: Diagnosis not present

## 2024-01-22 DIAGNOSIS — N938 Other specified abnormal uterine and vaginal bleeding: Secondary | ICD-10-CM | POA: Diagnosis not present

## 2024-01-22 DIAGNOSIS — D72829 Elevated white blood cell count, unspecified: Secondary | ICD-10-CM | POA: Diagnosis not present

## 2024-01-22 DIAGNOSIS — N939 Abnormal uterine and vaginal bleeding, unspecified: Secondary | ICD-10-CM | POA: Diagnosis not present

## 2024-01-22 LAB — URINALYSIS, ROUTINE W REFLEX MICROSCOPIC
Bilirubin Urine: NEGATIVE
Glucose, UA: NEGATIVE mg/dL
Ketones, ur: 15 mg/dL — AB
Nitrite: NEGATIVE
Protein, ur: 30 mg/dL — AB
RBC / HPF: >50 RBC/hpf (ref 0–5)
Specific Gravity, Urine: 1.022 (ref 1.005–1.030)
pH: 6 (ref 5.0–8.0)

## 2024-01-22 LAB — CBC
HCT: 43.8 % (ref 36.0–46.0)
Hemoglobin: 15.1 g/dL — ABNORMAL HIGH (ref 12.0–15.0)
MCH: 31 pg (ref 26.0–34.0)
MCHC: 34.5 g/dL (ref 30.0–36.0)
MCV: 89.9 fL (ref 80.0–100.0)
Platelets: 417 10*3/uL — ABNORMAL HIGH (ref 150–400)
RBC: 4.87 MIL/uL (ref 3.87–5.11)
RDW: 13.3 % (ref 11.5–15.5)
WBC: 13.1 10*3/uL — ABNORMAL HIGH (ref 4.0–10.5)
nRBC: 0 % (ref 0.0–0.2)

## 2024-01-22 LAB — COMPREHENSIVE METABOLIC PANEL
ALT: 25 U/L (ref 0–44)
AST: 27 U/L (ref 15–41)
Albumin: 4.6 g/dL (ref 3.5–5.0)
Alkaline Phosphatase: 99 U/L (ref 38–126)
Anion gap: 12 (ref 5–15)
BUN: 6 mg/dL (ref 6–20)
CO2: 21 mmol/L — ABNORMAL LOW (ref 22–32)
Calcium: 9.6 mg/dL (ref 8.9–10.3)
Chloride: 103 mmol/L (ref 98–111)
Creatinine, Ser: 0.63 mg/dL (ref 0.44–1.00)
GFR, Estimated: >60 mL/min (ref >60)
Glucose, Bld: 92 mg/dL (ref 70–99)
Potassium: 3.9 mmol/L (ref 3.5–5.1)
Sodium: 136 mmol/L (ref 135–145)
Total Bilirubin: 0.8 mg/dL (ref 0.0–1.2)
Total Protein: 8 g/dL (ref 6.5–8.1)

## 2024-01-22 LAB — WET PREP, GENITAL
Clue Cells Wet Prep HPF POC: NONE SEEN
Sperm: NONE SEEN
Trich, Wet Prep: NONE SEEN
WBC, Wet Prep HPF POC: <10 (ref <10)
Yeast Wet Prep HPF POC: NONE SEEN

## 2024-01-22 LAB — PREGNANCY, URINE: Preg Test, Ur: NEGATIVE

## 2024-01-22 LAB — LIPASE, BLOOD: Lipase: 14 U/L (ref 11–51)

## 2024-01-22 MED ORDER — MEGESTROL ACETATE 40 MG PO TABS: 40.0000 mg | ORAL_TABLET | Freq: Every day | ORAL | 0 refills | Status: DC

## 2024-01-22 MED ORDER — LIDOCAINE HCL (PF) 1 % IJ SOLN: 1.0000 mL | Freq: Once | INTRAMUSCULAR | Status: AC

## 2024-01-22 MED ORDER — LIDOCAINE HCL (PF) 1 % IJ SOLN
INTRAMUSCULAR | Status: AC
  Administered 2024-01-22: 1 mL
  Filled 2024-01-22: qty 5

## 2024-01-22 MED ORDER — CEFTRIAXONE SODIUM 500 MG IJ SOLR
500.0000 mg | Freq: Once | INTRAMUSCULAR | Status: AC
  Administered 2024-01-22: 500 mg via INTRAMUSCULAR
  Filled 2024-01-22: qty 500

## 2024-01-22 NOTE — ED Triage Notes (Signed)
 Pt to ED reporting vaginal bleeding since February 6th. Patient having to change pads q 2 hours. No blood clots but blood is bright red in color. Recently patient feeling weaker with nausea, vomiting and abdominal pain.   Pt reports similar vaginal bleeding years ago that she was seen and placed on a medication she can not remember the name of.

## 2024-01-22 NOTE — ED Provider Notes (Signed)
 [place] EMERGENCY DEPARTMENT AT [facility] Provider Note   CSN: [id number] Arrival date & time: 01/22/24  1518     History  Chief Complaint  Patient presents with   Vaginal Bleeding    Rebecca Flynn is a 43 y.o. female.  The history is provided by the patient and medical records. No language interpreter was used.  Vaginal Bleeding    43 year old female history of STI, kidney infection, dysfunctional uterine bleeding, endometrial polyps presenting with complaints of vaginal bleeding.  Patient states for more than a month she has had persistent vaginal bleeding.  States she goes to 5-6 pads per day.  For the past few days she now endorsed generalized fatigue and headache.  She also complaining of some generalized abdominal cramping.  She denies any new sexual partner no recent trauma no vaginal discharge or odor and no urinary symptoms.  She is a G2, P2.  She denies any specific treatment tried.  Home Medications Prior to Admission medications   Medication Sig Start Date End Date Taking? Authorizing Provider  albuterol (VENTOLIN HFA) 108 (90 Base) MCG/ACT inhaler Inhale 2 puffs into the lungs every 4 (four) hours as needed for wheezing or shortness of breath. 09/01/23   Renne Crigler, PA-C  clindamycin (CLINDAGEL) 1 % gel Apply topically 2 (two) times daily. 12/01/23   Garrison, [place] N, FNP  doxycycline (VIBRAMYCIN) 100 MG capsule Take 1 capsule (100 mg total) by mouth 2 (two) times daily. 12/01/23   Garrison, [place] N, FNP  fluconazole (DIFLUCAN) 150 MG tablet Take 1 tablet on day 3 of antibiotics and 1 tablet on the last day of antibiotics. 12/01/23   Garrison, [place] N, FNP  ibuprofen (ADVIL) 600 MG tablet Take 1 tablet (600 mg total) by mouth every 6 (six) hours as needed. 09/01/23   Renne Crigler, PA-C  methocarbamol (ROBAXIN) 500 MG tablet Take 2 tablets (1,000 mg total) by mouth every 8 (eight) hours as needed for muscle spasms. 09/01/23   Renne Crigler, PA-C       Allergies    Penicillins    Review of Systems   Review of Systems  Genitourinary:  Positive for vaginal bleeding.  All other systems reviewed and are negative.   Physical Exam Updated Vital Signs BP (!) 156/96   Pulse 74   Temp 98.2 F (36.8 C) (Oral)   Resp 18   Ht 5\' 4"  (1.626 m)   Wt 103.4 kg   LMP 12/23/2023 (Exact Date)   SpO2 98%   BMI 39.14 kg/m  Physical Exam Vitals and nursing note reviewed.  Constitutional:      General: She is not in acute distress.    Appearance: She is well-developed.  HENT:     Head: Atraumatic.  Eyes:     Conjunctiva/sclera: Conjunctivae normal.  Cardiovascular:     Rate and Rhythm: Normal rate and regular rhythm.     Pulses: Normal pulses.     Heart sounds: Normal heart sounds.  Pulmonary:     Effort: Pulmonary effort is normal.  Abdominal:     Palpations: Abdomen is soft.     Tenderness: There is no abdominal tenderness.  Musculoskeletal:     Cervical back: Neck supple.  Skin:    Findings: No rash.  Neurological:     Mental Status: She is alert.  Psychiatric:        Mood and Affect: Mood normal.     ED Results / Procedures / Treatments   Labs (all labs ordered  are listed, but only abnormal results are displayed) Labs Reviewed  COMPREHENSIVE METABOLIC PANEL - Abnormal; Notable for the following components:      Result Value   CO2 21 (*)    All other components within normal limits  CBC - Abnormal; Notable for the following components:   WBC 13.1 (*)    Hemoglobin 15.1 (*)    Platelets 417 (*)    All other components within normal limits  URINALYSIS, ROUTINE W REFLEX MICROSCOPIC - Abnormal; Notable for the following components:   Color, Urine ORANGE (*)    APPearance HAZY (*)    Hgb urine dipstick LARGE (*)    Ketones, ur 15 (*)    Protein, ur 30 (*)    Leukocytes,Ua TRACE (*)    Bacteria, UA RARE (*)    All other components within normal limits  LIPASE, BLOOD  PREGNANCY, URINE     EKG None  Radiology No results found.  Procedures .Pelvic exam  Date/Time: 01/22/2024 5:09 PM  Performed by: Fayrene Helper, PA-C Authorized by: Fayrene Helper, PA-C  Comments: Chaperone present during exam.  No inguinal lymphopathy or inguinal hernia noted.  Normal external genitalia.  Moderate amount of discomfort with speculum insertion.  Closed cervical os with blood noted in vaginal vault.  No obvious vaginal tear noted.  No significant discharge.  On bimanual exam bilateral adnexal tenderness with cervical motion tenderness.       Medications Ordered in ED Medications - No data to display  ED Course/ Medical Decision Making/ A&P                                 Medical Decision Making Amount and/or Complexity of Data Reviewed Labs: ordered. Radiology: ordered.  Risk Prescription drug management.   BP (!) 156/96   Pulse 74   Temp 98.2 F (36.8 C) (Oral)   Resp 18   Ht 5\' 4"  (1.626 m)   Wt 103.4 kg   LMP 12/23/2023 (Exact Date)   SpO2 98%   BMI 39.14 kg/m   25:44 PM  43 year old female history of STI, kidney infection, dysfunctional uterine bleeding, endometrial polyps presenting with complaints of vaginal bleeding.  Patient states for more than a month she has had persistent vaginal bleeding.  States she goes to 5-6 pads per day.  For the past few days she now endorsed generalized fatigue and headache.  She also complaining of some generalized abdominal cramping.  She denies any new sexual partner no recent trauma no vaginal discharge or odor and no urinary symptoms.  She is a G2, P2.  She denies any specific treatment tried.  Pelvic exam notable for moderate amount of discomfort with speculum insertion.  Patient also has blood noted in vaginal vault without any clots and no visualized vaginal tear.  Bleeding appears to come from cervical os.  No vaginal discharge noted.  She does have bilateral adnexal tenderness with cervical motion tenderness.  Exam is limited due  to body habitus.  After discussion we will prophylactically provide Rocephin to cover for potential STI.  I will also order transvaginal ultrasound for further assessment.  -Labs ordered, independently viewed and interpreted by me.  Labs remarkable for normal wet prep, electrolyte panels are reassuring, hemoglobin is 15.1 likely hemoconcentration.  White count is 13.1.  Urinalysis shows large hemoglobin likely contaminant from vaginal bleeding.  She does have trace of leukocytes and 11-20 WBC however she does not  endorse any urinary discomfort to suggest UTI.  Her pregnancy test is negative -The patient was maintained on a cardiac monitor.  I personally viewed and interpreted the cardiac monitored which showed an underlying rhythm of: Normal sinus rhythm -Imaging independently viewed and interpreted by me and I agree with radiologist's interpretation.  Result remarkable for transvaginal ultrasound shows possible intramural fibroid.  Fortunately normal endometrial thickness.  Radiologist recommend possible sonohysterogram should consider for focal lesion workup if bleeding not responding to hormonal or medical therapy. -This patient presents to the ED for concern of vaginal bleeding, this involves an extensive number of treatment options, and is a complaint that carries with it a high risk of complications and morbidity.  The differential diagnosis includes uterine fibroid, endometriosis, abnormal functional uterine bleeding, vaginal tear, PID, STI, UTI, cervicitis, cancer -Co morbidities that complicate the patient evaluation includes dysfunctional uterine bleeding -Treatment includes Rocephin -Reevaluation of the patient after these medicines showed that the patient stayed the same -PCP office notes or outside notes reviewed -Escalation to admission/observation considered: patients feels much better, is comfortable with discharge, and will follow up with OBGYN -Prescription medication considered, patient  comfortable with megace as pt has had this medication in the pats for her DUB -Social Determinant of Health considered which includes tobacco use         Final Clinical Impression(s) / ED Diagnoses Final diagnoses:  DUB (dysfunctional uterine bleeding)    Rx / DC Orders ED Discharge Orders          Ordered    megestrol (MEGACE) 40 MG tablet  Daily        01/22/24 1842              Fayrene Helper, PA-C 01/22/24 1843    Arby Barrette, MD 01/23/24 1706

## 2024-01-22 NOTE — Discharge Instructions (Addendum)
 Please take Megace to help with your vaginal bleeding but and follow-up closely with OB/GYN for further care.  If bleeding not improved with medication you may need further evaluation by a specialist to ensure no evidence of cancer or other concerning causes

## 2024-01-24 ENCOUNTER — Telehealth: Payer: Self-pay | Admitting: Family Medicine

## 2024-01-24 LAB — GC/CHLAMYDIA PROBE AMP (~~LOC~~) NOT AT ARMC
Chlamydia: NEGATIVE
Comment: NEGATIVE
Comment: NORMAL
Neisseria Gonorrhea: NEGATIVE

## 2024-01-24 NOTE — Telephone Encounter (Signed)
 Patient has been bleeding for 30 days. She went to ED and needs to be seen with Dr. Vergie Living which they referred her here to see Dr. Vergie Living and has an appointment for April 9. Want to ask nurse questions about what she can do in the time being could a nurse call her back please

## 2024-01-26 NOTE — Telephone Encounter (Signed)
 Left message to call office if she continues to have questions/concerns. Re:  Pt was prescribed Megace 40 mg daily from the ED.    Leonette Nutting

## 2024-02-02 MED ORDER — MEGESTROL ACETATE 40 MG PO TABS: 40.0000 mg | ORAL_TABLET | Freq: Every day | ORAL | 0 refills | Status: DC
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
## 2024-02-02 NOTE — Addendum Note (Signed)
 Addended by: Faythe Casa on: 02/02/2024 10:01 AM   Modules accepted: Orders

## 2024-02-23 ENCOUNTER — Other Ambulatory Visit (HOSPITAL_COMMUNITY)
Admission: RE | Admit: 2024-02-23 | Discharge: 2024-02-23 | Disposition: A | Discharge status: Home / Self Care | Source: Ambulatory Visit | Attending: Obstetrics and Gynecology | Admitting: Obstetrics and Gynecology

## 2024-02-23 ENCOUNTER — Other Ambulatory Visit: Payer: Self-pay

## 2024-02-23 ENCOUNTER — Ambulatory Visit (INDEPENDENT_AMBULATORY_CARE_PROVIDER_SITE_OTHER): Admitting: Obstetrics and Gynecology

## 2024-02-23 ENCOUNTER — Encounter: Payer: Self-pay | Admitting: Obstetrics and Gynecology

## 2024-02-23 VITALS — BP 135/89 | HR 86 | Wt 225.5 lb

## 2024-02-23 DIAGNOSIS — Z3202 Encounter for pregnancy test, result negative: Secondary | ICD-10-CM

## 2024-02-23 DIAGNOSIS — Z1231 Encounter for screening mammogram for malignant neoplasm of breast: Secondary | ICD-10-CM

## 2024-02-23 DIAGNOSIS — N939 Abnormal uterine and vaginal bleeding, unspecified: Secondary | ICD-10-CM | POA: Insufficient documentation

## 2024-02-23 DIAGNOSIS — N840 Polyp of corpus uteri: Secondary | ICD-10-CM | POA: Diagnosis not present

## 2024-02-23 HISTORY — PX: ENDOMETRIAL BIOPSY: PRO73

## 2024-02-23 LAB — CBC
Hematocrit: 46.9 % — ABNORMAL HIGH (ref 34.0–46.6)
Hemoglobin: 15.6 g/dL (ref 11.1–15.9)
MCH: 31.3 pg (ref 26.6–33.0)
MCHC: 33.3 g/dL (ref 31.5–35.7)
MCV: 94 fL (ref 79–97)
Platelets: 398 10*3/uL (ref 150–450)
RBC: 4.98 x10E6/uL (ref 3.77–5.28)
RDW: 13.2 % (ref 11.7–15.4)
WBC: 12.5 10*3/uL — ABNORMAL HIGH (ref 3.4–10.8)

## 2024-02-23 LAB — HEMOGLOBIN A1C
Est. average glucose Bld gHb Est-mCnc: 114 mg/dL
Hgb A1c MFr Bld: 5.6 % (ref 4.8–5.6)

## 2024-02-23 LAB — POCT PREGNANCY, URINE: Preg Test, Ur: NEGATIVE

## 2024-02-23 MED ORDER — MEGESTROL ACETATE 40 MG PO TABS: 40.0000 mg | ORAL_TABLET | Freq: Two times a day (BID) | ORAL | 1 refills | Status: DC

## 2024-02-23 NOTE — Procedures (Signed)
 Endometrial Biopsy Procedure Note  Pre-operative Diagnosis: AUB. H/o polyps  Post-operative Diagnosis: same   Procedure Details  Cervical exam performed in the presence of a chaperone Urine pregnancy test was done and result was negative.  The risks (including infection, bleeding, pain, and uterine perforation) and benefits of the procedure were explained to the patient and Written informed consent was obtained.  The patient was placed in the dorsal lithotomy position.  Bimanual exam showed the uterus to be in the neutral position.  A Graves' speculum inserted in the vagina, and the cervix was visualized and a pap smear performed; scant old blood seen in the vault. The cervix was then prepped with povidone iodine, and a sharp tenaculum was applied to the anterior lip of the cervix for stabilization.  A pipelle was inserted into the uterine cavity and sounded the uterus to a depth of 7.5cm.  A Minimal amount of polypoid appearing tissue was collected after 1 passes. The sample was sent for pathologic examination.  Condition: Stable  Complications: None  Plan: The patient was advised to call for any fever or for prolonged or severe pain or bleeding. She was advised to use OTC analgesics as needed for mild to moderate pain. She was advised to avoid vaginal intercourse for 48 hours or until the bleeding has completely stopped.  Cornelia Copa MD Attending Center for [facility])

## 2024-02-23 NOTE — Progress Notes (Signed)
 Obstetrics and Gynecology New Patient Evaluation  Appointment Date: 02/23/2024  OBGYN Clinic: Center for [facility]  Primary Care Provider: None  Referring Provider: [facility]  Chief Complaint:  Chief Complaint  Patient presents with   ED follow up    Abnormal uterine bleeding    History of Present Illness: Rebecca Flynn is a 43 y.o.  [id number], seen for the above chief complaint. Her past medical history is significant for h/o endometrial polyps, BMI 30s, c-section x 2.  Patient last seen by me in 2020 for annual exam and long h/o AUB controlled on megace 40 qdayand embx showed endometrial polyp; options d/w me and pt to touch  base and let me know next steps. Patient went to ED in early march for one month of AUB. She states she came off the megace to see how her periods were and she had qmonth, regular periods that were not heavy or painful. This last for little over a year before her March 2025 AUB. ED labs negative and u/s negative (see below). Patient started on megace 40 qday which slowed down the bleeding but still having some.   Review of Systems: Pertinent items are noted in HPI.   Patient Active Problem List   Diagnosis Date Noted   Endometrial polyp 05/29/2019   Obesity 01/05/2014   Smoker 01/05/2014   Abnormal uterine bleeding (AUB) 12/09/2012    Past Medical History:  Past Medical History:  Diagnosis Date   Asthma    Bronchitis    Cellulitis of right thigh 09/15/2017   Chlamydia    DUB (dysfunctional uterine bleeding)    Herpes genitalis    Hydrosalpinx    PID (pelvic inflammatory disease)    Pyelonephritis    Trichomonas    Trichomoniasis 05/13/2019   Urinary tract infection     Past Surgical History:  Past Surgical History:  Procedure Laterality Date   ANKLE SURGERY     CESAREAN SECTION     x2   ENDOMETRIAL BIOPSY  02/23/2024   WISDOM TOOTH EXTRACTION      Past Obstetrical History:  OB History  Gravida Para Term  Preterm AB Living  2 2 2  0 0 2  SAB IAB Ectopic Multiple Live Births  0 0  0 2    # Outcome Date GA Lbr Len/2nd Weight Sex Type Anes PTL Lv  2 Term      CS-LTranv     1 Term      CS-LTranv       Past Gynecological History: As per HPI.  Social History:  Social History   Socioeconomic History   Marital status: Single    Spouse name: Not on file   Number of children: Not on file   Years of education: Not on file   Highest education level: Not on file  Occupational History   Not on file  Tobacco Use   Smoking status: Former    Current packs/day: 0.50    Average packs/day: 0.5 packs/day for 17.0 years (8.5 ttl pk-yrs)    Types: Cigarettes   Smokeless tobacco: Never  Vaping Use   Vaping status: Never Used  Substance and Sexual Activity   Alcohol use: Yes    Comment: occasionally   Drug use: Not Currently    Types: Marijuana   Sexual activity: Yes    Birth control/protection: None  Other Topics Concern   Not on file  Social History Narrative   Not on file   Social Drivers  of Health   Financial Resource Strain: Not on file  Food Insecurity: Not on file  Transportation Needs: Not on file  Physical Activity: Not on file  Stress: Not on file  Social Connections: Not on file  Intimate Partner Violence: Not on file    Family History:  Family History  Problem Relation Age of Onset   Anesthesia problems Neg Hx    Other Neg Hx     Medications Megace 40 qday  Allergies Penicillins   Physical Exam:  BP 135/89   Pulse 86   Wt 225 lb 8 oz (102.3 kg)   BMI 38.71 kg/m  Body mass index is 38.71 kg/m. General appearance: Well nourished, well developed female in no acute distress.  Neck:  Supple, normal appearance, and no thyromegaly  Cardiovascular: normal s1 and s2.  No murmurs, rubs or gallops. Respiratory:  Clear to auscultation bilateral. Normal respiratory effort Abdomen: positive bowel sounds and no masses, hernias; diffusely non tender to palpation, non  distended Neuro/Psych:  Normal mood and affect.  Skin:  Warm and dry.  Lymphatic:  No inguinal lymphadenopathy.   Cervical exam performed in the presence of a chaperone Pelvic exam: is limited by body habitus EGBUS: within normal limits Vagina: within normal limits and with  scant old blood  blood in the vault Cervix: normal appearing cervix without tenderness, discharge or lesions. Uterus:  nonenlarged and non tender Adnexa:  normal adnexa and no mass, fullness, tenderness Rectovaginal: deferred  See procedure note re: embx  Laboratory: UPT negative ED labs with negative gc/ct/wet prep    Latest Ref Rng & Units 01/22/2024    3:32 PM 09/21/2017    7:21 AM 09/19/2017    5:22 AM  CBC  WBC 4.0 - 10.5 K/uL 13.1  11.6  9.5   Hemoglobin 12.0 - 15.0 g/dL 14.7  82.9  56.2   Hematocrit 36.0 - 46.0 % 43.8  42.2  40.2   Platelets 150 - 400 K/uL 417  517  511       Latest Ref Rng & Units 01/22/2024    3:32 PM 09/21/2017    7:21 AM 09/19/2017    5:22 AM  CMP  Glucose 70 - 99 mg/dL 92  91  90   BUN 6 - 20 mg/dL 6  10  9    Creatinine 0.44 - 1.00 mg/dL 1.30  8.65  7.84   Sodium 135 - 145 mmol/L 136  138  137   Potassium 3.5 - 5.1 mmol/L 3.9  4.3  4.1   Chloride 98 - 111 mmol/L 103  109  108   CO2 22 - 32 mmol/L 21  21  21    Calcium 8.9 - 10.3 mg/dL 9.6  9.2  8.7   Total Protein 6.5 - 8.1 g/dL 8.0  6.6    Total Bilirubin 0.0 - 1.2 mg/dL 0.8  0.7    Alkaline Phos 38 - 126 U/L 99  83    AST 15 - 41 U/L 27  88    ALT 0 - 44 U/L 25  94      Radiology:  Images reviewed  Narrative & Impression  CLINICAL DATA:  Vaginal bleeding.   EXAM: TRANSABDOMINAL AND TRANSVAGINAL ULTRASOUND OF PELVIS   TECHNIQUE: Both transabdominal and transvaginal ultrasound examinations of the pelvis were performed. Transabdominal technique was performed for global imaging of the pelvis including uterus, ovaries, adnexal regions, and pelvic cul-de-sac. It was necessary to proceed with endovaginal exam  following the transabdominal exam  to visualize the uterus, endometrium and adnexa.   COMPARISON:  Pelvis CT 09/15/2017   FINDINGS: Uterus   Measurements: 9.1 x 4.1 x 5 cm = volume: 97 mL. The uterus is anteverted. The myometrium is heterogeneous. Question of 15 mm intramural fibroid in the anterior uterine fundus.   Endometrium   Thickness: 10 mm.  No focal abnormality visualized.   Right ovary   Not visualized.  No adnexal mass.   Left ovary   Not visualized.  No adnexal mass.   Other findings   No abnormal free fluid.   IMPRESSION: 1. Heterogeneous uterus with possible intramural fibroid. 2. Normal endometrial thickness. If bleeding remains unresponsive to hormonal or medical therapy, sonohysterogram should be considered for focal lesion work-up. (Ref: Radiological Reasoning: Algorithmic Workup of Abnormal Vaginal Bleeding with Endovaginal Sonography and Sonohysterography. AJR 2008; 161:W96-04) 3. Neither ovary is visualized.  No adnexal mass.     Electronically Signed   By: Narda Rutherford M.D.   On: 01/22/2024 18:19   Assessment: patient stable  Plan:  1. Abnormal uterine bleeding (AUB) (Primary) Follow up embx results. I told her that I favor some type of surgical option at this point, especially if polyps are again seen. F/u with her after biopsy results to talk about next steps.  Increase megace to 40 bid for now - Cytology - PAP - Surgical pathology( [place]/ [facility])  2. Breast cancer screening by mammogram - MM 3D SCREENING MAMMOGRAM BILATERAL BREAST; Future  3. Endometrial polyp See above  Orders Placed This Encounter  Procedures   MM 3D SCREENING MAMMOGRAM BILATERAL BREAST   CBC   TSH Rfx on Abnormal to Free T4   Hemoglobin A1c   Pregnancy, urine POC    Return in about 10 days (around 03/04/2024) for in person or virtual, with dr Vergie Living.   Cornelia Copa MD Attending Center for [facility] [facility])

## 2024-02-24 ENCOUNTER — Encounter: Payer: Self-pay | Admitting: Obstetrics and Gynecology

## 2024-02-24 LAB — TSH RFX ON ABNORMAL TO FREE T4: TSH: 2.35 u[IU]/mL (ref 0.450–4.500)

## 2024-02-24 LAB — SURGICAL PATHOLOGY

## 2024-02-25 LAB — CYTOLOGY - PAP
Comment: NEGATIVE
Diagnosis: NEGATIVE
High risk HPV: NEGATIVE

## 2024-03-03 ENCOUNTER — Encounter: Payer: Self-pay | Admitting: Obstetrics and Gynecology

## 2024-03-20 ENCOUNTER — Ambulatory Visit

## 2024-03-27 ENCOUNTER — Telehealth: Payer: Self-pay | Admitting: Obstetrics and Gynecology

## 2024-03-27 ENCOUNTER — Ambulatory Visit: Admitting: Obstetrics and Gynecology

## 2024-03-27 NOTE — Telephone Encounter (Signed)
 Patient was scheduled today but her sister passed last night so she is not able to come, she has been rescheduled for Dr Aquilla Knapp next available appointment on 6-9.  Patient want to know if her medicine can be called in just enough to last until her appointment.

## 2024-03-30 NOTE — Telephone Encounter (Signed)
 Attempted to contact pt unable to leave message due to voicemail box not set up.    Columbia Pandey,RN

## 2024-04-24 ENCOUNTER — Ambulatory Visit (INDEPENDENT_AMBULATORY_CARE_PROVIDER_SITE_OTHER): Admitting: Obstetrics and Gynecology

## 2024-04-24 ENCOUNTER — Encounter: Payer: Self-pay | Admitting: Obstetrics and Gynecology

## 2024-04-24 ENCOUNTER — Telehealth: Payer: Self-pay | Admitting: Family Medicine

## 2024-04-24 ENCOUNTER — Ambulatory Visit: Admitting: Obstetrics and Gynecology

## 2024-04-24 ENCOUNTER — Other Ambulatory Visit: Payer: Self-pay

## 2024-04-24 ENCOUNTER — Other Ambulatory Visit (HOSPITAL_COMMUNITY)
Admission: RE | Admit: 2024-04-24 | Discharge: 2024-04-24 | Disposition: A | Discharge status: Home / Self Care | Source: Ambulatory Visit | Attending: Obstetrics and Gynecology | Admitting: Obstetrics and Gynecology

## 2024-04-24 VITALS — BP 162/105 | HR 83 | Wt 225.4 lb

## 2024-04-24 DIAGNOSIS — I10 Essential (primary) hypertension: Secondary | ICD-10-CM | POA: Diagnosis not present

## 2024-04-24 DIAGNOSIS — N840 Polyp of corpus uteri: Secondary | ICD-10-CM | POA: Diagnosis not present

## 2024-04-24 DIAGNOSIS — Z6838 Body mass index (BMI) 38.0-38.9, adult: Secondary | ICD-10-CM

## 2024-04-24 DIAGNOSIS — N939 Abnormal uterine and vaginal bleeding, unspecified: Secondary | ICD-10-CM | POA: Diagnosis not present

## 2024-04-24 DIAGNOSIS — Z0289 Encounter for other administrative examinations: Secondary | ICD-10-CM

## 2024-04-24 DIAGNOSIS — Z202 Contact with and (suspected) exposure to infections with a predominantly sexual mode of transmission: Secondary | ICD-10-CM | POA: Diagnosis not present

## 2024-04-24 DIAGNOSIS — Z113 Encounter for screening for infections with a predominantly sexual mode of transmission: Secondary | ICD-10-CM | POA: Diagnosis not present

## 2024-04-24 MED ORDER — MEGESTROL ACETATE 40 MG PO TABS: 40.0000 mg | ORAL_TABLET | Freq: Two times a day (BID) | ORAL | 1 refills | Status: DC

## 2024-04-24 MED ORDER — AMLODIPINE BESYLATE 5 MG PO TABS: 5.0000 mg | ORAL_TABLET | Freq: Every day | ORAL | 1 refills | Status: DC

## 2024-04-24 NOTE — Telephone Encounter (Signed)
 Called patient to let her know she needs to come in and sign and ROI for her Medical Accomodation paperwork.

## 2024-04-24 NOTE — Progress Notes (Unsigned)
 Obstetrics and Gynecology Established Patient Evaluation  Appointment Date: 04/24/2024  OBGYN Clinic: [facility]  Primary Care Provider: None  Referring Provider: [facility]  Chief Complaint: AUB follow up  History of Present Illness: Rebecca Flynn is a 43 y.o.  [id number], seen for the above chief complaint. Her past medical history is significant for h/o endometrial polyps, BMI 30s, c-section x 2.  Patient last seen by me in 2020 for annual exam and long h/o AUB controlled on megace  40 qdayand embx showed endometrial polyp; options d/w me and pt to touch  base and let me know next steps. She said that the megace  40 qday was working well but then there was an 8 month period where she had prescriptions and her AUB came back.    Patient went to ED in early march for one month of AUB. She states she came off the megace  to see how her periods were and she had qmonth, regular periods that were not heavy or painful. This last for little over a year before her March 2025 AUB. ED labs negative and u/s negative (see below). Patient started on megace  40 qday which slowed down the bleeding but still having some.  Patient seen on 4/9 and had an embx, which came back fragments of benign endometrial polyps, and negative TSH/A1c, CBC/pap and hpv. U/s ordered and it was normal with ES 10mm wnl and 9 x 4 5cm sized. Megace  40 qday increased to bid.   Interval History: infidelity with fiance, patient already seeing counselor. Pt desires STD screening. She did increase the megace  to bid and it helped and now some AUB every other day.  Review of Systems: Pertinent items are noted in HPI.   Patient Active Problem List   Diagnosis Date Noted   Hypertension 04/24/2024   Endometrial polyp 05/29/2019   BMI 38.0-38.9,adult 01/05/2014   Smoker 01/05/2014   Abnormal uterine bleeding (AUB) 12/09/2012    Past Medical History:  Past Medical History:  Diagnosis Date   Asthma     Bronchitis    Cellulitis of right thigh 09/15/2017   Chlamydia    DUB (dysfunctional uterine bleeding)    Herpes genitalis    Hydrosalpinx    PID (pelvic inflammatory disease)    Pyelonephritis    Trichomoniasis 05/13/2019   Urinary tract infection     Past Surgical History:  Past Surgical History:  Procedure Laterality Date   ANKLE SURGERY     CESAREAN SECTION     x2   ENDOMETRIAL BIOPSY  02/23/2024   WISDOM TOOTH EXTRACTION      Past Obstetrical History:  OB History  Gravida Para Term Preterm AB Living  2 2 2  0 0 2  SAB IAB Ectopic Multiple Live Births  0 0  0 2    # Outcome Date GA Lbr Len/2nd Weight Sex Type Anes PTL Lv  2 Term      CS-LTranv     1 Term      CS-LTranv       Past Gynecological History: As per HPI.  Social History:  Social History   Socioeconomic History   Marital status: Single    Spouse name: Not on file   Number of children: Not on file   Years of education: Not on file   Highest education level: Not on file  Occupational History   Not on file  Tobacco Use   Smoking status: Former    Current packs/day: 0.50  Average packs/day: 0.5 packs/day for 17.0 years (8.5 ttl pk-yrs)    Types: Cigarettes   Smokeless tobacco: Never  Vaping Use   Vaping status: Never Used  Substance and Sexual Activity   Alcohol use: Yes    Comment: occasionally   Drug use: Not Currently    Types: Marijuana   Sexual activity: Yes    Birth control/protection: None  Other Topics Concern   Not on file  Social History Narrative   Not on file   Social Drivers of Health   Financial Resource Strain: Not on file  Food Insecurity: Not on file  Transportation Needs: Not on file  Physical Activity: Not on file  Stress: Not on file  Social Connections: Not on file  Intimate Partner Violence: Not on file    Family History:  Family History  Problem Relation Age of Onset   Anesthesia problems Neg Hx    Other Neg Hx     Medications Megace  40  qday  Allergies Penicillins   Physical Exam:  BP (!) 162/105 (BP Location: Left Arm, Patient Position: Sitting, Cuff Size: Large)   Pulse 83   Wt 225 lb 6.4 oz (102.2 kg)   SpO2 100%   BMI 38.69 kg/m  Body mass index is 38.69 kg/m. General appearance: Well nourished, well developed female in no acute distress.  Respiratory: Normal respiratory effort Neuro/Psych:  Normal mood and affect.  Skin:  Warm and dry.   Laboratory: UPT negative ED labs with negative gc/ct/wet prep    Latest Ref Rng & Units 02/23/2024   10:09 AM 01/22/2024    3:32 PM 09/21/2017    7:21 AM  CBC  WBC 3.4 - 10.8 x10E3/uL 12.5  13.1  11.6   Hemoglobin 11.1 - 15.9 g/dL 40.9  81.1  91.4   Hematocrit 34.0 - 46.6 % 46.9  43.8  42.2   Platelets 150 - 450 x10E3/uL 398  417  517       Latest Ref Rng & Units 01/22/2024    3:32 PM 09/21/2017    7:21 AM 09/19/2017    5:22 AM  CMP  Glucose 70 - 99 mg/dL 92  91  90   BUN 6 - 20 mg/dL 6  10  9    Creatinine 0.44 - 1.00 mg/dL 7.82  9.56  2.13   Sodium 135 - 145 mmol/L 136  138  137   Potassium 3.5 - 5.1 mmol/L 3.9  4.3  4.1   Chloride 98 - 111 mmol/L 103  109  108   CO2 22 - 32 mmol/L 21  21  21    Calcium 8.9 - 10.3 mg/dL 9.6  9.2  8.7   Total Protein 6.5 - 8.1 g/dL 8.0  6.6    Total Bilirubin 0.0 - 1.2 mg/dL 0.8  0.7    Alkaline Phos 38 - 126 U/L 99  83    AST 15 - 41 U/L 27  88    ALT 0 - 44 U/L 25  94      Radiology: no new images  Narrative & Impression  CLINICAL DATA:  Vaginal bleeding.   EXAM: TRANSABDOMINAL AND TRANSVAGINAL ULTRASOUND OF PELVIS   TECHNIQUE: Both transabdominal and transvaginal ultrasound examinations of the pelvis were performed. Transabdominal technique was performed for global imaging of the pelvis including uterus, ovaries, adnexal regions, and pelvic cul-de-sac. It was necessary to proceed with endovaginal exam following the transabdominal exam to visualize the uterus, endometrium and adnexa.   COMPARISON:  Pelvis CT  09/15/2017   FINDINGS: Uterus   Measurements: 9.1 x 4.1 x 5 cm = volume: 97 mL. The uterus is anteverted. The myometrium is heterogeneous. Question of 15 mm intramural fibroid in the anterior uterine fundus.   Endometrium   Thickness: 10 mm.  No focal abnormality visualized.   Right ovary   Not visualized.  No adnexal mass.   Left ovary   Not visualized.  No adnexal mass.   Other findings   No abnormal free fluid.   IMPRESSION: 1. Heterogeneous uterus with possible intramural fibroid. 2. Normal endometrial thickness. If bleeding remains unresponsive to hormonal or medical therapy, sonohysterogram should be considered for focal lesion work-up. (Ref: Radiological Reasoning: Algorithmic Workup of Abnormal Vaginal Bleeding with Endovaginal Sonography and Sonohysterography. AJR 2008; 161:W96-04) 3. Neither ovary is visualized.  No adnexal mass.     Electronically Signed   By: Chadwick Colonel M.D.   On: 01/22/2024 18:19   Assessment: patient stable  Plan:  1. Abnormal uterine bleeding (AUB) (Primary) Based on s/s and pathology, I told her I recommend going to the OR for a hysteroscopy, d&c, possible Myosure polypectomy. R/b/a d/w her and she is amenable with proceeding.   2. HTN Patient does not have a PCP. She has MyChart and I went through with her on how to make a new patient visit. Pt amenable to starting amlodipine 5mg  qday for now. RTC 10-14d for BP check  3. Endometrial polyp See above  4. Social Patient states already seeing a Veterinary surgeon.    Return in about 2 weeks (around 05/08/2024) for bp check, rn visit.   Tyler Gallant MD Attending [facility])

## 2024-04-25 ENCOUNTER — Ambulatory Visit: Payer: Self-pay | Admitting: Obstetrics and Gynecology

## 2024-04-25 LAB — CERVICOVAGINAL ANCILLARY ONLY
Bacterial Vaginitis (gardnerella): POSITIVE — AB
Candida Glabrata: NEGATIVE
Candida Vaginitis: NEGATIVE
Chlamydia: NEGATIVE
Comment: NEGATIVE
Comment: NEGATIVE
Comment: NEGATIVE
Comment: NEGATIVE
Comment: NEGATIVE
Comment: NORMAL
Neisseria Gonorrhea: NEGATIVE
Trichomonas: NEGATIVE

## 2024-04-25 LAB — CBC
Hematocrit: 46.7 % — ABNORMAL HIGH (ref 34.0–46.6)
Hemoglobin: 15.1 g/dL (ref 11.1–15.9)
MCH: 31.3 pg (ref 26.6–33.0)
MCHC: 32.3 g/dL (ref 31.5–35.7)
MCV: 97 fL (ref 79–97)
Platelets: 348 10*3/uL (ref 150–450)
RBC: 4.83 x10E6/uL (ref 3.77–5.28)
RDW: 13 % (ref 11.7–15.4)
WBC: 7.7 10*3/uL (ref 3.4–10.8)

## 2024-04-25 LAB — RPR+HBSAG+HCVAB+...
HIV Screen 4th Generation wRfx: NONREACTIVE
Hep C Virus Ab: NONREACTIVE
Hepatitis B Surface Ag: NEGATIVE
RPR Ser Ql: NONREACTIVE

## 2024-04-26 ENCOUNTER — Other Ambulatory Visit: Payer: Self-pay | Admitting: Lactation Services

## 2024-04-26 ENCOUNTER — Other Ambulatory Visit: Payer: Self-pay

## 2024-04-26 ENCOUNTER — Telehealth: Payer: Self-pay

## 2024-04-26 DIAGNOSIS — B379 Candidiasis, unspecified: Secondary | ICD-10-CM

## 2024-04-26 MED ORDER — METRONIDAZOLE 500 MG PO TABS: 500.0000 mg | ORAL_TABLET | Freq: Two times a day (BID) | ORAL | 0 refills | Status: DC

## 2024-04-26 MED ORDER — FLUCONAZOLE 150 MG PO TABS: ORAL_TABLET | ORAL | 0 refills | Status: DC

## 2024-04-26 NOTE — Telephone Encounter (Signed)
 Called Pt to advise that Diflucan  was sent to her pharmacy on file, no answer, left VM.

## 2024-05-01 ENCOUNTER — Encounter: Payer: Self-pay | Admitting: Obstetrics and Gynecology

## 2024-05-01 ENCOUNTER — Other Ambulatory Visit: Payer: Self-pay

## 2024-05-01 DIAGNOSIS — N939 Abnormal uterine and vaginal bleeding, unspecified: Secondary | ICD-10-CM

## 2024-05-01 MED ORDER — MEDROXYPROGESTERONE ACETATE 10 MG PO TABS: 10.0000 mg | ORAL_TABLET | Freq: Every day | ORAL | 0 refills | Status: DC

## 2024-05-02 ENCOUNTER — Ambulatory Visit

## 2024-05-04 ENCOUNTER — Telehealth: Payer: Self-pay | Admitting: Family Medicine

## 2024-05-04 NOTE — Telephone Encounter (Signed)
 Attempted to reach patient about her FMLA paperwork. She filled out spaces needed to be filled out by the office staff. Left a message to bring in more paperwork.

## 2024-05-09 ENCOUNTER — Telehealth: Payer: Self-pay

## 2024-05-09 NOTE — Telephone Encounter (Signed)
 I called patient to see if she was available on 06/13/24 for surgery w/ Dr. Izell at 4 pm. Patient gladly accepted appointment. Surgery details and pre-op instructions were discussed by phone. Patient confirmed understanding.

## 2024-05-10 ENCOUNTER — Ambulatory Visit

## 2024-05-10 ENCOUNTER — Other Ambulatory Visit: Payer: Self-pay

## 2024-05-10 VITALS — BP 123/90 | HR 77 | Ht 64.0 in | Wt 229.0 lb

## 2024-05-10 DIAGNOSIS — I10 Essential (primary) hypertension: Secondary | ICD-10-CM

## 2024-05-10 NOTE — Progress Notes (Signed)
 Blood Pressure Check Visit  Rebecca Flynn is here for blood pressure check following GYN visit on 04/24/24. Patient was prescribed amlodipine  5 mg daily for HTN and has been compliant. BP today is 126/90 and recheck BP was 123/90. Patient denies any dizzness blurred vision headache shortness of breath or peripheral edema. Advised patient to continue taking medication. All questions addressed and denies any other needs at this time.   Rosaline Pendleton, RN 05/10/2024  9:08 AM

## 2024-05-24 ENCOUNTER — Telehealth: Payer: Self-pay | Admitting: Obstetrics and Gynecology

## 2024-05-24 NOTE — Telephone Encounter (Signed)
 Spoke to patient about her FMLA, and she would only need 2 to 4 weeks to be out of work for recovery after her surgery that she is scheduled for on 06/13/2024. She will not need time off for flare-ups. She is sending me another form to fill out.

## 2024-06-05 ENCOUNTER — Other Ambulatory Visit: Payer: Self-pay | Admitting: *Deleted

## 2024-06-05 ENCOUNTER — Encounter: Payer: Self-pay | Admitting: Obstetrics and Gynecology

## 2024-06-05 MED ORDER — METRONIDAZOLE 0.75 % VA GEL
1.0000 | Freq: Every day | VAGINAL | 0 refills | Status: DC
Start: 2024-06-05 — End: 2024-06-13

## 2024-06-05 MED ORDER — METRONIDAZOLE 0.75 % VA GEL: 1.0000 | Freq: Two times a day (BID) | VAGINAL | 0 refills | Status: DC

## 2024-06-05 NOTE — Progress Notes (Signed)
 Pt sent message that she has vomited for 3 days while taking po Metronidazole  and still ahs sx of BV.  Rx for Metrogel  e-prescribed.

## 2024-06-06 ENCOUNTER — Telehealth: Payer: Self-pay | Admitting: Lactation Services

## 2024-06-06 ENCOUNTER — Other Ambulatory Visit: Payer: Self-pay | Admitting: Obstetrics and Gynecology

## 2024-06-06 DIAGNOSIS — Z87898 Personal history of other specified conditions: Secondary | ICD-10-CM

## 2024-06-06 DIAGNOSIS — Z01818 Encounter for other preprocedural examination: Secondary | ICD-10-CM

## 2024-06-06 DIAGNOSIS — N939 Abnormal uterine and vaginal bleeding, unspecified: Secondary | ICD-10-CM

## 2024-06-06 MED ORDER — FLUCONAZOLE 150 MG PO TABS: 150.0000 mg | ORAL_TABLET | Freq: Once | ORAL | 0 refills | Status: AC

## 2024-06-06 NOTE — Telephone Encounter (Signed)
 Patient requested us  to call the Pharmacy to clarify orders for Metrogel .   Called and spoke with Katherene at [facility] to clarify. Informed to cancel Metrogel  ordered BID and fill Metrogel  every day.   Called patient and informed her that Medication has been ordered for correct dosage and Pharmacy is filling. Patient requesting if can have a Diflucan  to take after finishing ATB as all ATB causes yeast infections for her. Diflucan  sent in per standing order.   Patient will call with any questions or concerns.

## 2024-06-09 ENCOUNTER — Telehealth: Admitting: Obstetrics and Gynecology

## 2024-06-09 DIAGNOSIS — Z01818 Encounter for other preprocedural examination: Secondary | ICD-10-CM

## 2024-06-09 NOTE — Progress Notes (Signed)
   TELEHEALTH GYNECOLOGY VISIT ENCOUNTER NOTE  Provider location: [facility]   Patient location: Home  I connected with Rebecca Flynn on 06/09/24 at  9:15 AM EDT by telephone and verified that I am speaking with the correct person using two identifiers. Patient was unable to do MyChart audiovisual encounter due to technical difficulties, she tried several times.    I discussed the limitations, risks, security and privacy concerns of performing an evaluation and management service by telephone and the availability of in person appointments. I also discussed with the patient that there may be a patient responsible charge related to this service. The patient expressed understanding and agreed to proceed.   History:  Rebecca Flynn is a 43 y.o. G2P2002 being evaluated today for more questions re: her upcoming surgery. She is scheduled for a hysteroscopy, d&c, myosure polypectomy for 7/29.  Patient wondering about downtime after surgery and if she'll have any cuts on her belly.      Past Medical History:  Diagnosis Date   Asthma    Bronchitis    Cellulitis of right thigh 09/15/2017   Chlamydia    DUB (dysfunctional uterine bleeding)    Herpes genitalis    Hydrosalpinx    PID (pelvic inflammatory disease)    Pyelonephritis    Trichomoniasis 05/13/2019   Urinary tract infection    Past Surgical History:  Procedure Laterality Date   ANKLE SURGERY     CESAREAN SECTION     x2   ENDOMETRIAL BIOPSY  02/23/2024   WISDOM TOOTH EXTRACTION     The following portions of the patient's history were reviewed and updated as appropriate: allergies, current medications, past family history, past medical history, past social history, past surgical history and problem list.   Review of Systems:  Pertinent items noted in HPI and remainder of comprehensive ROS otherwise negative.  Physical Exam:   General:  Alert, oriented and cooperative.   Mental Status:  Normal mood and affect perceived. Normal judgment and thought content.  Physical exam deferred due to nature of the encounter  Labs and Imaging No results found for this or any previous visit (from the past 2 weeks). No results found.    Assessment and Plan:     1. Pre-op evaluation (Primary) I told her that the surgery should only need to be vaginal but, very rarely, there can be complications and need for needing to make cuts on her belly to evaluate any potential injury. I also told her more about this being an outpatient procedure, recovery, that she can go back to work on 7/31, she'll need someone to take her home and be with her overnight, etc.       I discussed the assessment and treatment plan with the patient. The patient was provided an opportunity to ask questions and all were answered. The patient agreed with the plan and demonstrated an understanding of the instructions.   The patient was advised to call back or seek an in-person evaluation/go to the ED if the symptoms worsen or if the condition fails to improve as anticipated.  I provided 15 minutes of non-face-to-face time during this encounter.   Bebe Furry, MD [facility], [facility]

## 2024-06-12 ENCOUNTER — Encounter (HOSPITAL_COMMUNITY): Payer: Self-pay | Admitting: Obstetrics and Gynecology

## 2024-06-12 ENCOUNTER — Other Ambulatory Visit: Payer: Self-pay

## 2024-06-12 NOTE — Progress Notes (Signed)
 SDW CALL  Patient was given pre-op instructions over the phone. The opportunity was given for the patient to ask questions. No further questions asked. Patient verbalized understanding of instructions given.  DATE & ARRIVAL TIME-  PCP -  Cardiologist -   PPM/ICD -denies Device Orders - n/a Rep Notified - n/a  Chest x-ray -  EKG - 06-06-24 Stress Test -  ECHO -  Cardiac Cath -   Sleep Study - denies CPAP - n/a  DM -denies  Blood Thinner Instructions:denies Aspirin Instructions:n/a  ERAS Protcol -clear liquids until 1:30 PM   COVID TEST- n/a   Anesthesia review: no  Patient denies shortness of breath, fever, cough and chest pain over the phone call   All instructions explained to the patient, with a verbal understanding of the material. Patient agrees to go over the instructions while at home for a better understanding.

## 2024-06-13 ENCOUNTER — Other Ambulatory Visit: Payer: Self-pay

## 2024-06-13 ENCOUNTER — Encounter (HOSPITAL_COMMUNITY): Payer: Self-pay | Admitting: Obstetrics and Gynecology

## 2024-06-13 ENCOUNTER — Ambulatory Visit (HOSPITAL_COMMUNITY)
Admission: RE | Admit: 2024-06-13 | Discharge: 2024-06-13 | Disposition: A | Discharge status: Home / Self Care | Attending: Obstetrics and Gynecology | Admitting: Obstetrics and Gynecology

## 2024-06-13 ENCOUNTER — Ambulatory Visit (HOSPITAL_COMMUNITY): Admitting: Registered Nurse

## 2024-06-13 ENCOUNTER — Encounter (HOSPITAL_COMMUNITY)
Admission: RE | Disposition: A | Discharge status: Home / Self Care | Payer: Self-pay | Source: Home / Self Care | Attending: Obstetrics and Gynecology

## 2024-06-13 DIAGNOSIS — Z9889 Other specified postprocedural states: Secondary | ICD-10-CM

## 2024-06-13 DIAGNOSIS — Z87891 Personal history of nicotine dependence: Secondary | ICD-10-CM | POA: Insufficient documentation

## 2024-06-13 DIAGNOSIS — Z01818 Encounter for other preprocedural examination: Secondary | ICD-10-CM

## 2024-06-13 DIAGNOSIS — N840 Polyp of corpus uteri: Secondary | ICD-10-CM | POA: Diagnosis not present

## 2024-06-13 DIAGNOSIS — I10 Essential (primary) hypertension: Secondary | ICD-10-CM | POA: Insufficient documentation

## 2024-06-13 DIAGNOSIS — N939 Abnormal uterine and vaginal bleeding, unspecified: Secondary | ICD-10-CM | POA: Diagnosis not present

## 2024-06-13 DIAGNOSIS — Z87898 Personal history of other specified conditions: Secondary | ICD-10-CM

## 2024-06-13 HISTORY — DX: Essential (primary) hypertension: I10

## 2024-06-13 HISTORY — PX: HYSTEROSCOPY WITH D & C: SHX1775

## 2024-06-13 LAB — CBC
HCT: 47.4 % — ABNORMAL HIGH (ref 36.0–46.0)
Hemoglobin: 15.6 g/dL — ABNORMAL HIGH (ref 12.0–15.0)
MCH: 31.4 pg (ref 26.0–34.0)
MCHC: 32.9 g/dL (ref 30.0–36.0)
MCV: 95.4 fL (ref 80.0–100.0)
Platelets: 387 K/uL (ref 150–400)
RBC: 4.97 MIL/uL (ref 3.87–5.11)
RDW: 13.9 % (ref 11.5–15.5)
WBC: 13 K/uL — ABNORMAL HIGH (ref 4.0–10.5)
nRBC: 0 % (ref 0.0–0.2)

## 2024-06-13 LAB — POCT PREGNANCY, URINE: Preg Test, Ur: NEGATIVE

## 2024-06-13 SURGERY — DILATATION AND CURETTAGE /HYSTEROSCOPY
Anesthesia: General

## 2024-06-13 MED ORDER — HEATING PAD PADS: 1.0000 | MEDICATED_PAD | Status: AC | PRN

## 2024-06-13 MED ORDER — IBUPROFEN 600 MG PO TABS: 600.0000 mg | ORAL_TABLET | Freq: Four times a day (QID) | ORAL | 0 refills | Status: DC | PRN

## 2024-06-13 MED ORDER — DEXAMETHASONE SODIUM PHOSPHATE 10 MG/ML IJ SOLN
INTRAMUSCULAR | Status: DC | PRN
  Administered 2024-06-13: 5 mg via INTRAVENOUS

## 2024-06-13 MED ORDER — DROPERIDOL 2.5 MG/ML IJ SOLN: 0.6250 mg | Freq: Once | INTRAMUSCULAR | Status: DC | PRN

## 2024-06-13 MED ORDER — LIDOCAINE HCL (PF) 1 % IJ SOLN
INTRAMUSCULAR | Status: AC
  Filled 2024-06-13: qty 30

## 2024-06-13 MED ORDER — MIDAZOLAM HCL 2 MG/2ML IJ SOLN
INTRAMUSCULAR | Status: DC | PRN
  Administered 2024-06-13: 2 mg via INTRAVENOUS

## 2024-06-13 MED ORDER — SILVER NITRATE-POT NITRATE 75-25 % EX MISC
CUTANEOUS | Status: DC | PRN
  Administered 2024-06-13: 3 via TOPICAL

## 2024-06-13 MED ORDER — OXYCODONE HCL 5 MG/5ML PO SOLN: 5.0000 mg | Freq: Once | ORAL | Status: DC | PRN

## 2024-06-13 MED ORDER — ACETAMINOPHEN 10 MG/ML IV SOLN: 1000.0000 mg | Freq: Once | INTRAVENOUS | Status: DC | PRN

## 2024-06-13 MED ORDER — FENTANYL CITRATE (PF) 250 MCG/5ML IJ SOLN
INTRAMUSCULAR | Status: DC | PRN
  Administered 2024-06-13 (×2): 50 ug via INTRAVENOUS

## 2024-06-13 MED ORDER — KETOROLAC TROMETHAMINE 30 MG/ML IJ SOLN
INTRAMUSCULAR | Status: DC | PRN
  Administered 2024-06-13: 30 mg via INTRAVENOUS

## 2024-06-13 MED ORDER — ACETAMINOPHEN 500 MG PO TABS
1000.0000 mg | ORAL_TABLET | Freq: Once | ORAL | Status: AC
  Administered 2024-06-13: 1000 mg via ORAL
  Filled 2024-06-13: qty 2

## 2024-06-13 MED ORDER — MIDAZOLAM HCL 2 MG/2ML IJ SOLN
INTRAMUSCULAR | Status: AC
  Filled 2024-06-13: qty 2

## 2024-06-13 MED ORDER — OXYCODONE HCL 5 MG PO TABS: 5.0000 mg | ORAL_TABLET | Freq: Once | ORAL | Status: DC | PRN

## 2024-06-13 MED ORDER — KETOROLAC TROMETHAMINE 30 MG/ML IJ SOLN
INTRAMUSCULAR | Status: AC
  Filled 2024-06-13: qty 1

## 2024-06-13 MED ORDER — ONDANSETRON HCL 4 MG/2ML IJ SOLN
INTRAMUSCULAR | Status: DC | PRN
  Administered 2024-06-13: 4 mg via INTRAVENOUS

## 2024-06-13 MED ORDER — FENTANYL CITRATE (PF) 100 MCG/2ML IJ SOLN
INTRAMUSCULAR | Status: AC
Start: 2024-06-13 — End: 2024-06-13
  Filled 2024-06-13: qty 2

## 2024-06-13 MED ORDER — LIDOCAINE HCL 1 % IJ SOLN
INTRAMUSCULAR | Status: DC | PRN
  Administered 2024-06-13: 20 mL

## 2024-06-13 MED ORDER — ORAL CARE MOUTH RINSE: 15.0000 mL | Freq: Once | OROMUCOSAL | Status: AC

## 2024-06-13 MED ORDER — FENTANYL CITRATE (PF) 100 MCG/2ML IJ SOLN
25.0000 ug | INTRAMUSCULAR | Status: DC | PRN
  Administered 2024-06-13: 50 ug via INTRAVENOUS

## 2024-06-13 MED ORDER — PROPOFOL 10 MG/ML IV BOLUS
INTRAVENOUS | Status: DC | PRN
  Administered 2024-06-13: 200 mg via INTRAVENOUS

## 2024-06-13 MED ORDER — LACTATED RINGERS IV SOLN: INTRAVENOUS | Status: DC

## 2024-06-13 MED ORDER — FENTANYL CITRATE (PF) 250 MCG/5ML IJ SOLN
INTRAMUSCULAR | Status: AC
  Filled 2024-06-13: qty 5

## 2024-06-13 MED ORDER — LIDOCAINE 2% (20 MG/ML) 5 ML SYRINGE
INTRAMUSCULAR | Status: DC | PRN
  Administered 2024-06-13: 100 mg via INTRAVENOUS

## 2024-06-13 MED ORDER — CHLORHEXIDINE GLUCONATE 0.12 % MT SOLN
15.0000 mL | Freq: Once | OROMUCOSAL | Status: AC
  Administered 2024-06-13: 15 mL via OROMUCOSAL
  Filled 2024-06-13: qty 15

## 2024-06-13 MED ORDER — 0.9 % SODIUM CHLORIDE (POUR BTL) OPTIME
TOPICAL | Status: DC | PRN
Start: 2024-06-13 — End: 2024-06-13
  Administered 2024-06-13: 1000 mL

## 2024-06-13 SURGICAL SUPPLY — 16 items
CATH ROBINSON RED A/P 16FR (CATHETERS) ×1 IMPLANT
COVER MAYO STAND STRL (DRAPES) ×1 IMPLANT
DEVICE MYOSURE LITE (MISCELLANEOUS) IMPLANT
DEVICE MYOSURE REACH (MISCELLANEOUS) IMPLANT
GLOVE BIOGEL PI IND STRL 7.5 (GLOVE) ×1 IMPLANT
GLOVE SURG SS PI 7.0 STRL IVOR (GLOVE) ×1 IMPLANT
GLOVE SURG UNDER POLY LF SZ7 (GLOVE) ×1 IMPLANT
GOWN STRL REUS W/ TWL LRG LVL3 (GOWN DISPOSABLE) ×1 IMPLANT
GOWN STRL REUS W/ TWL XL LVL3 (GOWN DISPOSABLE) ×1 IMPLANT
KIT PROCEDURE FLUENT (KITS) ×1 IMPLANT
KIT TURNOVER KIT B (KITS) ×1 IMPLANT
PACK VAGINAL MINOR WOMEN LF (CUSTOM PROCEDURE TRAY) ×1 IMPLANT
PAD OB MATERNITY 11 LF (PERSONAL CARE ITEMS) ×1 IMPLANT
SEAL ROD LENS SCOPE MYOSURE (ABLATOR) IMPLANT
TOWEL GREEN STERILE FF (TOWEL DISPOSABLE) ×2 IMPLANT
UNDERPAD 30X36 HEAVY ABSORB (UNDERPADS AND DIAPERS) ×1 IMPLANT

## 2024-06-13 NOTE — Anesthesia Postprocedure Evaluation (Signed)
 Anesthesia Post Note  Patient: Rebecca Flynn  Procedure(s) Performed: DILATATION AND CURETTAGE /HYSTEROSCOPY     Patient location during evaluation: PACU Anesthesia Type: General Level of consciousness: awake and alert Pain management: pain level controlled Vital Signs Assessment: post-procedure vital signs reviewed and stable Respiratory status: spontaneous breathing, nonlabored ventilation, respiratory function stable and patient connected to nasal cannula oxygen Cardiovascular status: blood pressure returned to baseline and stable Postop Assessment: no apparent nausea or vomiting Anesthetic complications: no   There were no known notable events for this encounter.  Last Vitals:  Vitals:   06/13/24 1645 06/13/24 1700  BP: (!) 157/90   Pulse: 64 78  Resp: (!) 22 20  Temp:    SpO2: 96% 99%    Last Pain:  Vitals:   06/13/24 1645  TempSrc:   PainSc: 7                  Thom SAUNDERS Peoples

## 2024-06-13 NOTE — Transfer of Care (Signed)
 Immediate Anesthesia Transfer of Care Note  Patient: Rebecca Flynn  Procedure(s) Performed: DILATATION AND CURETTAGE /HYSTEROSCOPY  Patient Location: PACU  Anesthesia Type:General  Level of Consciousness: awake, alert , and oriented  Airway & Oxygen Therapy: Patient Spontanous Breathing  Post-op Assessment: Report given to RN and Post -op Vital signs reviewed and stable  Post vital signs: Reviewed and stable  Last Vitals:  Vitals Value Taken Time  BP 151/93 1621  Temp    Pulse 77 06/13/24 16:21  Resp 25 06/13/24 16:21  SpO2 100 % 06/13/24 16:21  Vitals shown include unfiled device data.  Last Pain:  Vitals:   06/13/24 1414  TempSrc:   PainSc: 4       Patients Stated Pain Goal: 2 (06/13/24 1409)  Complications: There were no known notable events for this encounter.

## 2024-06-13 NOTE — Discharge Instructions (Addendum)
   We will discuss your surgery once again in detail at your post-op visit in two to four weeks. If you haven't already done so, please call to make your appointment as soon as possible.  Hysteroscopy, Care After These instructions give you information on caring for yourself after your procedure. Your doctor may also give you more specific instructions. Call your doctor if you have any problems or questions after your procedure. HOME CARE Do not drive for 24 hours. Wait 1 week before doing any activities that wear you out. Do not stand for a long time. Limit stair climbing to once or twice a day. Rest often. Continue with your usual diet. Drink enough fluids to keep your pee (urine) clear or pale yellow. If you have a hard time pooping (constipation), you may: Take a medicine to help you go poop (laxative) as told by your doctor. Eat more fruit and bran. Drink more fluids. Take showers, not baths, for as long as told by your doctor. Do not swim or use a hot tub until your doctor says it is okay. Have someone with you for 1day after the procedure. Do not douche, use tampons, or have sex (intercourse) until seen by your doctor Only take medicines as told by your doctor. Do not take aspirin. It can cause bleeding. Keep all doctor visits. GET HELP IF: You have cramps or pain not helped by medicine. You have new pain in the belly (abdomen). You have a bad smelling fluid coming from your vagina. You have a rash. You have problems with any medicine. GET HELP RIGHT AWAY IF:  You start to bleed more than a regular period. You have a fever. You have chest pain. You have trouble breathing. You feel dizzy or feel like passing out (fainting). You Proffit out. You have pain in the tops of your shoulders. You have vaginal bleeding with or without clumps of blood (blood clots). MAKE SURE YOU: Understand these instructions. Will watch your condition. Will get help right away if you are not doing  well or get worse. Document Released: 08/11/2008 Document Revised: 11/07/2013 Document Reviewed: 06/01/2013 [facility] Patient Information 2015 [place], [place]. This information is not intended to replace advice given to you by your health care provider. Make sure you discuss any questions you have with your health care provider.   Post Anesthesia Home Care Instructions  Activity: Get plenty of rest for the remainder of the day. A responsible individual must stay with you for 24 hours following the procedure.  For the next 24 hours, DO NOT: -Drive a car -Advertising copywriter -Drink alcoholic beverages -Take any medication unless instructed by your physician -Make any legal decisions or sign important papers.  Meals: Start with liquid foods such as gelatin or soup. Progress to regular foods as tolerated. Avoid greasy, spicy, heavy foods. If nausea and/or vomiting occur, drink only clear liquids until the nausea and/or vomiting subsides. Call your physician if vomiting continues.  Special Instructions/Symptoms: Your throat may feel dry or sore from the anesthesia or the breathing tube placed in your throat during surgery. If this causes discomfort, gargle with warm salt water. The discomfort should disappear within 24 hours.

## 2024-06-13 NOTE — H&P (Signed)
 Obstetrics & Gynecology Surgical H&P   Date of Surgery: 06/13/2024   Primary OBGYN: [facility]  Reason for Admission: scheduled hysteroscopy, d&c, Myosure myomectomy for AUB  History of Present Illness: Ms. Cory is a 43 y.o. [id number] (Patient's last menstrual period was 12/02/2023 (approximate).), with the above chief complaint. Her past medical history is significant for h/o endometrial polyps, BMI 30s, c-section x 2.   Patient last seen by me Early June 2025 and at that time, going to OR was recommended for recurrent endometrial polyps based on April 2025 embx; pap and hpv negative. She declined endometrial ablation   Prior history:  In 2020 for annual exam and long h/o AUB controlled on megace  40 qdayand embx showed endometrial polyp; options d/w me and pt to touch  base and let me know next steps. She said that the megace  40 qday was working well but then there was an 8 month period where she had prescriptions and her AUB came back.     Patient went to ED in early march for one month of AUB. She states she came off the megace  to see how her periods were and she had qmonth, regular periods that were not heavy or painful. This last for little over a year before her March 2025 AUB. ED labs negative and u/s negative (see below). Patient started on megace  40 qday which slowed down the bleeding but still having some.  Patient seen on 4/9 and had an embx, which came back fragments of benign endometrial polyps, and negative TSH/A1c, CBC/pap and hpv. U/s ordered and it was normal with ES 10mm wnl and 9 x 4 5cm sized. Megace  40 qday increased to bid.   ROS: A 12-point review of systems was performed and negative, except as stated in the above HPI.  OBGYN History: As per HPI. OB History  Gravida Para Term Preterm AB Living  2 2 2  0 0 2  SAB IAB Ectopic Multiple Live Births  0 0  0 2    # Outcome Date GA Lbr Len/2nd Weight Sex Type Anes PTL Lv  2 Term       CS-LTranv     1 Term      CS-LTranv        Past Medical History: Past Medical History:  Diagnosis Date   Asthma    Bronchitis    Cellulitis of right thigh 09/15/2017   Chlamydia    DUB (dysfunctional uterine bleeding)    Herpes genitalis    Hydrosalpinx    Hypertension    PID (pelvic inflammatory disease)    Pyelonephritis    Trichomoniasis 05/13/2019   Urinary tract infection     Past Surgical History: Past Surgical History:  Procedure Laterality Date   ANKLE SURGERY     CESAREAN SECTION     x2   ENDOMETRIAL BIOPSY  02/23/2024   WISDOM TOOTH EXTRACTION      Family History:  Family History  Problem Relation Age of Onset   Anesthesia problems Neg Hx    Other Neg Hx     Social History:  Social History   Socioeconomic History   Marital status: Single    Spouse name: Not on file   Number of children: Not on file   Years of education: Not on file   Highest education level: Not on file  Occupational History   Not on file  Tobacco Use   Smoking status: Former    Current packs/day: 0.50  Average packs/day: 0.5 packs/day for 17.0 years (8.5 ttl pk-yrs)    Types: Cigarettes   Smokeless tobacco: Never  Vaping Use   Vaping status: Never Used  Substance and Sexual Activity   Alcohol use: Yes    Comment: occasionally   Drug use: Not Currently    Types: Marijuana   Sexual activity: Yes    Birth control/protection: None  Other Topics Concern   Not on file  Social History Narrative   Not on file   Social Drivers of Health   Financial Resource Strain: Not on file  Food Insecurity: Not on file  Transportation Needs: Not on file  Physical Activity: Not on file  Stress: Not on file  Social Connections: Not on file  Intimate Partner Violence: Not on file    Allergy: Allergies  Allergen Reactions   Penicillins Nausea And Vomiting and Rash    Current Outpatient Medications: Current Facility-Administered Medications  Medication Dose Route Frequency  Provider Last Rate Last Admin   acetaminophen  (TYLENOL ) tablet 1,000 mg  1,000 mg Oral Once Erma Thom SAUNDERS, MD       chlorhexidine  (PERIDEX ) 0.12 % solution 15 mL  15 mL Mouth/Throat Once Erma Thom SAUNDERS, MD       Or   Oral care mouth rinse  15 mL Mouth Rinse Once Erma Thom SAUNDERS, MD       lactated ringers  infusion   Intravenous Continuous Erma Thom SAUNDERS, MD         Physical Exam:  Current Vital Signs 24h Vital Sign Ranges  T 98.4 F (36.9 C) Temp  Avg: 98.4 F (36.9 C)  Min: 98.4 F (36.9 C)  Max: 98.4 F (36.9 C)  BP (!) 165/103 BP  Min: 165/103  Max: 165/103  HR 83 Pulse  Avg: 83  Min: 83  Max: 83  RR 20 Resp  Avg: 20  Min: 20  Max: 20  SaO2 98 %   SpO2  Avg: 98 %  Min: 98 %  Max: 98 %       24 Hour I/O Current Shift I/O  Time Ins Outs No intake/output data recorded. No intake/output data recorded.    Body mass index is 38.79 kg/m. General appearance: Well nourished, well developed female in no acute distress.  Cardiovascular: S1, S2 normal, no murmur, rub or gallop, regular rate and rhythm Respiratory:  Clear to auscultation bilateral. Normal respiratory effort Abdomen: positive bowel sounds and no masses, hernias; diffusely non tender to palpation, non distended Neuro/Psych:  Normal mood and affect.  Skin:  Warm and dry.  Extremities: no clubbing, cyanosis, or edema.   From 02/23/2024   Cervical exam performed in the presence of a chaperone Pelvic exam: is limited by body habitus EGBUS: within normal limits Vagina: within normal limits and with scant old blood blood in the vault Cervix: normal appearing cervix without tenderness, discharge or lesions. Uterus:  nonenlarged and non tender Adnexa:  normal adnexa and no mass, fullness, tenderness Rectovaginal: deferred   See procedure note re: embx-->patient sounded to 7.5cm   Laboratory: Pending: BMP and UPT Recent Labs  Lab 06/13/24 1335  WBC 13.0*  HGB 15.6*  HCT 47.4*  PLT 387   Imaging:  Narrative & Impression   CLINICAL DATA:  Vaginal bleeding.   EXAM: TRANSABDOMINAL AND TRANSVAGINAL ULTRASOUND OF PELVIS   TECHNIQUE: Both transabdominal and transvaginal ultrasound examinations of the pelvis were performed. Transabdominal technique was performed for global imaging of the pelvis including uterus, ovaries, adnexal regions,  and pelvic cul-de-sac. It was necessary to proceed with endovaginal exam following the transabdominal exam to visualize the uterus, endometrium and adnexa.   COMPARISON:  Pelvis CT 09/15/2017   FINDINGS: Uterus   Measurements: 9.1 x 4.1 x 5 cm = volume: 97 mL. The uterus is anteverted. The myometrium is heterogeneous. Question of 15 mm intramural fibroid in the anterior uterine fundus.   Endometrium   Thickness: 10 mm.  No focal abnormality visualized.   Right ovary   Not visualized.  No adnexal mass.   Left ovary   Not visualized.  No adnexal mass.   Other findings   No abnormal free fluid.   IMPRESSION: 1. Heterogeneous uterus with possible intramural fibroid. 2. Normal endometrial thickness. If bleeding remains unresponsive to hormonal or medical therapy, sonohysterogram should be considered for focal lesion work-up. (Ref: Radiological Reasoning: Algorithmic Workup of Abnormal Vaginal Bleeding with Endovaginal Sonography and Sonohysterography. AJR 2008; [id number]:D31-26) 3. Neither ovary is visualized.  No adnexal mass.     Electronically Signed   By: Andrea Gasman M.D.   On: 01/22/2024 18:19    Assessment: Ms. Basich is a 43 y.o. [id number] here for scheduled surgery. Pt stable  Plan: D/w her and patient still amenable to proceeding with hysteroscopy, d&c, ossible myosure polypectomy. Can d/w her at post op visit re: continuing on progestin to prevent new polyps from forming in the future.   Bebe Izell Overcast MD Attending [facility] for [facility])

## 2024-06-13 NOTE — Anesthesia Preprocedure Evaluation (Addendum)
 Anesthesia Evaluation  Patient identified by MRN, date of birth, ID band Patient awake    Reviewed: Allergy & Precautions, H&P , NPO status , Patient's Chart, lab work & pertinent test results  History of Anesthesia Complications Negative for: history of anesthetic complications  Airway Mallampati: II  TM Distance: >3 FB Neck ROM: Full    Dental no notable dental hx.    Pulmonary asthma , former smoker   Pulmonary exam normal breath sounds clear to auscultation       Cardiovascular hypertension, Normal cardiovascular exam Rhythm:Regular Rate:Normal     Neuro/Psych  negative psych ROS   GI/Hepatic negative GI ROS, Neg liver ROS,,,  Endo/Other  negative endocrine ROS    Renal/GU negative Renal ROS  negative genitourinary   Musculoskeletal negative musculoskeletal ROS (+)    Abdominal   Peds negative pediatric ROS (+)  Hematology negative hematology ROS (+)   Anesthesia Other Findings Abnormal uterine bleeding  Reproductive/Obstetrics negative OB ROS                              Anesthesia Physical Anesthesia Plan  ASA: 2  Anesthesia Plan: General   Post-op Pain Management: Tylenol  PO (pre-op)*   Induction: Intravenous  PONV Risk Score and Plan: 3 and Ondansetron  and Dexamethasone   Airway Management Planned:   Additional Equipment: None  Intra-op Plan:   Post-operative Plan: Extubation in OR  Informed Consent: I have reviewed the patients History and Physical, chart, labs and discussed the procedure including the risks, benefits and alternatives for the proposed anesthesia with the patient or authorized representative who has indicated his/her understanding and acceptance.     Dental advisory given  Plan Discussed with: CRNA  Anesthesia Plan Comments:          Anesthesia Quick Evaluation

## 2024-06-13 NOTE — Op Note (Signed)
 Operative Note   06/13/2024  PRE-OP DIAGNOSIS: Abnormal uterine bleeding. History of endometrial polyps   POST-OP DIAGNOSIS: Same   SURGEON: Surgeons and Role:    DEWAINE Izell Harari, MD - Primary  ASSISTANT: None  PROCEDURE: Hysteroscopy, dilation and curettage  ANESTHESIA: General and paracervical block   ESTIMATED BLOOD LOSS: 10mL  DRAINS: I/O cath done for no UOP   TOTAL IV FLUIDS: per OR note  SPECIMENS: endometrial curettings  VTE PROPHYLAXIS: SCDs to the bilateral lower extremities  ANTIBIOTICS: not indicated  FLUID DEFICIT: <512mL  COMPLICATIONS: none  DISPOSITION: PACU - hemodynamically stable.  CONDITION: stable  FINDINGS: Exam under anesthesia revealed normal EGBUS, cervix and vagina with scant old blood in the vault and no active bleeding. The uterus was small, mobile with no masses and bilateral adnexa without masses or fullness. Hysteroscopy revealed a grossly normal appearing uterine cavity that had a few years that were fluffy with bilateral tubal ostia and normal appearing endocervical canal. Normal appearing cavity after D&C  PROCEDURE IN DETAIL:  After informed consent was obtained, the patient was taken to the operating room where anesthesia was obtained without difficulty. The patient was positioned in the dorsal lithotomy position in [place] stirrups.  The patient's bladder was catheterized with an in and out foley catheter.  The patient was examined under anesthesia, with the above noted findings.  The bi-valved speculum was placed inside the patient's vagina, and the the anterior lip of the cervix was seen and grasped with the tenaculum.  A paracervical block was achieved with 20mL 1% lidocaine .  The uterine cavity was sounded to 9cm, and then the cervix was progressively dilated to a 19French-Pratt dilator.  The hysteroscope was introduced, with the above noted findings. The hystersocope was removed and the uterine cavity was curetted until a gritty  texture was noted, yielding moderate amount endometrial curettings. Repeat hysteroscopy negative  Excellent hemostasis was noted, and all instruments were removed, with excellent hemostasis noted throughout.  She was then taken out of dorsal lithotomy. The patient tolerated the procedure well.  Sponge, lap and instrument counts were correct x2.  The patient was taken to recovery room in excellent condition.  Harari Izell Raddle MD Attending [facility])

## 2024-06-13 NOTE — Anesthesia Procedure Notes (Signed)
 Procedure Name: LMA Insertion Date/Time: 06/13/2024 3:41 PM  Performed by: Christopher Comings, CRNAPre-anesthesia Checklist: Patient identified, Emergency Drugs available, Suction available and Patient being monitored Patient Re-evaluated:Patient Re-evaluated prior to induction Oxygen Delivery Method: Circle system utilized Preoxygenation: Pre-oxygenation with 100% oxygen Induction Type: IV induction Ventilation: Mask ventilation without difficulty LMA: LMA inserted LMA Size: 4.0 Number of attempts: 1 Placement Confirmation: positive ETCO2 and breath sounds checked- equal and bilateral Tube secured with: Tape Dental Injury: Teeth and Oropharynx as per pre-operative assessment

## 2024-06-13 NOTE — Brief Op Note (Signed)
 06/13/2024  4:10 PM  PATIENT:  Rebecca Flynn  43 y.o. female  PRE-OPERATIVE DIAGNOSIS:  Abnormal uterine bleeding polyp  POST-OPERATIVE DIAGNOSIS:  Abnormal uterine bleeding.   PROCEDURE:  Hysteroscopy, d&c  SURGEON:  Surgeons and Role:    * Izell Harari, MD - Primary  ASSISTANTS: none   ANESTHESIA:   general and paracervical block  EBL:  10 mL   BLOOD ADMINISTERED:none  DRAINS: I/O cath done for no UOP   LOCAL MEDICATIONS USED:  LIDOCAINE    SPECIMEN:  endometrial curettings  IVF: per OR note  DISPOSITION OF SPECIMEN:  PATHOLOGY  COUNTS:  YES  TOURNIQUET:  * No tourniquets in log *  DICTATION: .Note written in EPIC  PLAN OF CARE: Discharge to home after PACU  PATIENT DISPOSITION:  PACU - hemodynamically stable.   Delay start of Pharmacological VTE agent (>24hrs) due to surgical blood loss or risk of bleeding: not applicable  Harari Izell Raddle MD Attending [facility]) 06/13/2024 Time: [phone]

## 2024-06-14 ENCOUNTER — Encounter (HOSPITAL_COMMUNITY): Payer: Self-pay | Admitting: Obstetrics and Gynecology

## 2024-06-15 LAB — SURGICAL PATHOLOGY

## 2024-06-26 ENCOUNTER — Telehealth: Payer: Self-pay

## 2024-06-26 NOTE — Telephone Encounter (Signed)
 I attempted to call pt multiple times to reschedule her appointment on 06/30/24 due to provider not being in office on that day. I left pt voicemail to call the office back to reschedule her appointment. YL,RMA

## 2024-06-30 ENCOUNTER — Ambulatory Visit: Payer: Self-pay | Admitting: Family Medicine

## 2024-07-07 ENCOUNTER — Ambulatory Visit: Admitting: Obstetrics and Gynecology

## 2024-07-07 ENCOUNTER — Other Ambulatory Visit: Payer: Self-pay

## 2024-07-07 VITALS — BP 141/109 | HR 75 | Wt 228.6 lb

## 2024-07-07 DIAGNOSIS — N840 Polyp of corpus uteri: Secondary | ICD-10-CM

## 2024-07-07 DIAGNOSIS — Z1331 Encounter for screening for depression: Secondary | ICD-10-CM

## 2024-07-07 DIAGNOSIS — D72829 Elevated white blood cell count, unspecified: Secondary | ICD-10-CM | POA: Diagnosis not present

## 2024-07-07 DIAGNOSIS — D582 Other hemoglobinopathies: Secondary | ICD-10-CM | POA: Insufficient documentation

## 2024-07-07 DIAGNOSIS — Z09 Encounter for follow-up examination after completed treatment for conditions other than malignant neoplasm: Secondary | ICD-10-CM

## 2024-07-07 DIAGNOSIS — N939 Abnormal uterine and vaginal bleeding, unspecified: Secondary | ICD-10-CM

## 2024-07-07 DIAGNOSIS — I10 Essential (primary) hypertension: Secondary | ICD-10-CM

## 2024-07-07 LAB — CBC
Hematocrit: 47.4 % — ABNORMAL HIGH (ref 34.0–46.6)
Hemoglobin: 15 g/dL (ref 11.1–15.9)
MCH: 30.9 pg (ref 26.6–33.0)
MCHC: 31.6 g/dL (ref 31.5–35.7)
MCV: 98 fL — ABNORMAL HIGH (ref 79–97)
Platelets: 376 x10E3/uL (ref 150–450)
RBC: 4.86 x10E6/uL (ref 3.77–5.28)
RDW: 12.5 % (ref 11.7–15.4)
WBC: 10.2 x10E3/uL (ref 3.4–10.8)

## 2024-07-07 MED ORDER — NORETHINDRONE 0.35 MG PO TABS: 1.0000 | ORAL_TABLET | Freq: Every day | ORAL | 1 refills | Status: DC

## 2024-07-07 MED ORDER — AMLODIPINE BESYLATE 5 MG PO TABS: 5.0000 mg | ORAL_TABLET | Freq: Every day | ORAL | 0 refills | Status: DC

## 2024-07-07 NOTE — Progress Notes (Signed)
 Obstetrics and Gynecology Visit Return Patient Evaluation  Appointment Date: 07/07/2024  Primary Care Provider: Pcp, No  OBGYN Clinic: [facility]  Chief Complaint: routine post op check  History of Present Illness:  Rebecca Flynn is a 43 y.o. s/p 7/29 hysteroscopy, d&c for AUB, h/o recurrent endometrial polyps. History significant for HTN, PID, c/s x 2, BMI 38  Interval History: Since that time, she states that she only has occasional spotting. She does note some increased cramping at night helped by NSAID. She continues on the megace  40 bid. Final pathology showed benign endometrial polyps with atropic, inactive endometrium  Review of Systems: as noted in the History of Present Illness.  Patient Active Problem List   Diagnosis Date Noted   Elevated hemoglobin (HCC) 07/07/2024   Hypertension 04/24/2024   Endometrial polyp 05/29/2019   BMI 38.0-38.9,adult 01/05/2014   Smoker 01/05/2014   Abnormal uterine bleeding (AUB) 12/09/2012   Medications:  Dorthea JONELLE. Koepp had no medications administered during this visit. Current Outpatient Medications  Medication Sig Dispense Refill   albuterol  (VENTOLIN  HFA) 108 (90 Base) MCG/ACT inhaler Inhale 2 puffs into the lungs every 4 (four) hours as needed for wheezing or shortness of breath. 18 g 0   ibuprofen  (ADVIL ) 600 MG tablet Take 1 tablet (600 mg total) by mouth every 6 (six) hours as needed. 30 tablet 0   Megace  40 mg po bid 90 tablet 1   amLODipine  (NORVASC ) 5 MG tablet Take 1 tablet (5 mg total) by mouth daily. 60 tablet 0   Heating Pad PADS 1 Pad by Does not apply route continuous as needed. (Patient not taking: Reported on 07/07/2024)     Multiple Vitamin (MULTIVITAMIN WITH MINERALS) TABS tablet Take 1 tablet by mouth daily. (Patient not taking: Reported on 07/07/2024)     Omega-3 Fatty Acids (FISH OIL PO) Take 2 capsules by mouth daily. (Patient not taking: Reported on 07/07/2024)     No current  facility-administered medications for this visit.    Allergies: is allergic to penicillins.  Physical Exam:  BP (!) 141/109   Pulse 75   Wt 228 lb 9.6 oz (103.7 kg)   LMP 12/02/2023 (Approximate) Comment: Bleeding has not stopped since January  BMI 39.24 kg/m  Body mass index is 39.24 kg/m. General appearance: Well nourished, well developed female in no acute distress.  Neuro/Psych:  Normal mood and affect.    Assessment: patient doing well  Plan:  1. Leukocytosis, unspecified type (Primary) And elevated h/h, which isn't new. Recheck today but could be from tobacco abuse.  - CBC  2. Endometrial polyp D/w her that the megace  40 bid worked like how it did in the past to keep her bleeding under control as evidenced by the inactive endometrium but it wasn't supposed to eliminate the polyps. After the d&c, the endometrium is now clean but the polyps will come back, likely due to her BMI. I told her I recommended she do a Mirena; I had recommended an ablation at the time of hysteroscopy but patient declined. Pt could also do expectant management, which I don't recommend, or PO progestins that are not megace ; she has only ever tried depo provera  11 years ago but she didn't like it. Pt amenable to trial of a different po progestin as the megace  has made her gain weight. Slynd not covered so will try camila . D/w pt how to take it  3. Abnormal uterine bleeding (AUB)  4. Hypertension, unspecified type Norvasc   refill given. Pt states establish care visit with PCP visit last week cancelled by clinic because doctor wasn't going to be there. She's had difficult contacting them again for a visit but she's going to by there today  5. Postop check  6. Elevated hemoglobin (HCC)  Return in about 3 months (around 10/07/2024) for in person, with dr izell.  No future appointments.  Bebe izell Raddle MD Attending [facility])

## 2024-07-26 ENCOUNTER — Encounter: Payer: Self-pay | Admitting: Family Medicine

## 2024-07-26 ENCOUNTER — Ambulatory Visit (INDEPENDENT_AMBULATORY_CARE_PROVIDER_SITE_OTHER): Payer: Self-pay | Admitting: Family Medicine

## 2024-07-26 VITALS — BP 130/84 | HR 77 | Temp 98.4°F | Ht 64.0 in | Wt 231.0 lb

## 2024-07-26 DIAGNOSIS — Z2821 Immunization not carried out because of patient refusal: Secondary | ICD-10-CM

## 2024-07-26 DIAGNOSIS — I10 Essential (primary) hypertension: Secondary | ICD-10-CM

## 2024-07-26 DIAGNOSIS — Z136 Encounter for screening for cardiovascular disorders: Secondary | ICD-10-CM | POA: Diagnosis not present

## 2024-07-26 DIAGNOSIS — Z6839 Body mass index (BMI) 39.0-39.9, adult: Secondary | ICD-10-CM

## 2024-07-26 DIAGNOSIS — E66812 Obesity, class 2: Secondary | ICD-10-CM

## 2024-07-26 DIAGNOSIS — Z7689 Persons encountering health services in other specified circumstances: Secondary | ICD-10-CM

## 2024-07-26 LAB — POCT URINALYSIS DIP (CLINITEK)
Bilirubin, UA: NEGATIVE
Glucose, UA: NEGATIVE mg/dL
Ketones, POC UA: NEGATIVE mg/dL
Leukocytes, UA: NEGATIVE
Nitrite, UA: NEGATIVE
Spec Grav, UA: 1.02 (ref 1.010–1.025)
Urobilinogen, UA: 0.2 U/dL
pH, UA: 6 (ref 5.0–8.0)

## 2024-07-26 MED ORDER — AMLODIPINE BESYLATE 5 MG PO TABS: 5.0000 mg | ORAL_TABLET | Freq: Every day | ORAL | 1 refills | Status: DC

## 2024-07-26 NOTE — Progress Notes (Signed)
 I,Jameka J Llittleton, CMA,acting as a Neurosurgeon for [employer], Rebecca Flynn.,have documented all relevant documentation on the behalf of Rebecca Creighton, Rebecca Flynn,as directed by  Rebecca Creighton, Rebecca Flynn while in the presence of Rebecca Creighton, Rebecca Flynn.  Subjective:  Patient ID: Rebecca Flynn , female    DOB: [birth year] , 43 y.o.   MRN: [id number]  Chief Complaint  Patient presents with   Establish Care    Patient presents today to establish care.    Hypertension    Patient reports she was just put on amlodipine .    Weight Loss    Patient is interested in starting zepbound.     HPI Discussed the use of AI scribe software for clinical note transcription with the patient, who gave verbal consent to proceed.  History of Present Illness      Rebecca Flynn is a 43 year old female who presents to establish care and address concerns about hypertension and weight management.  She has a history of prolonged uterine bleeding from January to the end of July, which led to a procedure involving uterine scraping and burning due to polyps. Previously, she was on medication to manage bleeding, but this time polyps were identified as the cause.  She is experiencing challenges with weight management despite dieting and exercising. She has struggled to lose weight and has gained weight instead. She is seeking assistance with weight loss and has inquired about medications such as Zepbound, although she is aware of insurance coverage issues.  She has been prescribed amlodipine  for hypertension, which she takes every other day. She questions the necessity of this medication, as she associates her elevated blood pressure readings with stress from her previous bleeding episodes. She is concerned about the implications of high blood pressure on her health.  She mentions a past history of frequent urinary tract infections and expresses surprise at a mention of kidney disease in her medical chart, which she was previously unaware of. No smoking, but  she is exposed to secondhand smoke from her father.  Her diet includes chicken, [place], and fish, and she avoids red meat and dairy. She has also stopped consuming kimchi and rice due to their salt content, switching to brown rice instead. She wants to manage her health better and is concerned about the impact of stress on her blood pressure.  She is encouraged to strive for BMI less than 30 to decrease cardiac risk. Advised to aim for at least 150 minutes of exercise per week.      Past Medical History:  Diagnosis Date   Asthma    Bronchitis    Cellulitis of right thigh 09/15/2017   Chlamydia    DUB (dysfunctional uterine bleeding)    Herpes genitalis    Hydrosalpinx    Hypertension    PID (pelvic inflammatory disease)    Pyelonephritis    Trichomoniasis 05/13/2019   Urinary tract infection      Family History  Problem Relation Age of Onset   Heart disease Father    Lung cancer Father    Atrial fibrillation Father    Leukemia Paternal Grandmother    Anesthesia problems Neg Hx    Other Neg Hx      Current Outpatient Medications:    albuterol  (VENTOLIN  HFA) 108 (90 Base) MCG/ACT inhaler, Inhale 2 puffs into the lungs every 4 (four) hours as needed for wheezing or shortness of breath., Disp: 18 g, Rfl: 0   ibuprofen  (ADVIL ) 600 MG tablet, Take 1 tablet (600 mg  total) by mouth every 6 (six) hours as needed., Disp: 30 tablet, Rfl: 0   Multiple Vitamin (MULTIVITAMIN WITH MINERALS) TABS tablet, Take 1 tablet by mouth daily., Disp: , Rfl:    amLODipine  (NORVASC ) 5 MG tablet, Take 1 tablet (5 mg total) by mouth daily., Disp: 90 tablet, Rfl: 1   fluconazole  (DIFLUCAN ) 150 MG tablet, Take 1 tablet (150 mg total) by mouth every 3 (three) days., Disp: 2 tablet, Rfl: 0   Heating Pad PADS, 1 Pad by Does not apply route continuous as needed. (Patient not taking: Reported on 07/26/2024), Disp: , Rfl:    norethindrone  (CAMILA ) 0.35 MG tablet, Take 1 tablet (0.35 mg total) by mouth daily. At  the same time every day (Patient not taking: Reported on 07/26/2024), Disp: 90 tablet, Rfl: 1   Omega-3 Fatty Acids (FISH OIL PO), Take 2 capsules by mouth daily. (Patient not taking: Reported on 07/07/2024), Disp: , Rfl:    Allergies  Allergen Reactions   Penicillins Nausea And Vomiting and Rash     Review of Systems  Constitutional: Negative.   Eyes: Negative.   Cardiovascular: Negative.  Negative for chest pain, palpitations and leg swelling.  Gastrointestinal: Negative.   Endocrine: Negative.   Musculoskeletal: Negative.   Skin: Negative.   Psychiatric/Behavioral: Negative.       Today's Vitals   07/26/24 1006 07/26/24 1057  BP: (!) 140/90 130/84  Pulse: 77   Temp: 98.4 F (36.9 C)   TempSrc: Oral   Weight: 231 lb (104.8 kg)   Height: 5' 4 (1.626 m)   PainSc: 0-No pain    Body mass index is 39.65 kg/m.  Wt Readings from Last 3 Encounters:  07/26/24 231 lb (104.8 kg)  07/07/24 228 lb 9.6 oz (103.7 kg)  06/13/24 226 lb (102.5 kg)    The 10-year ASCVD risk score (Arnett DK, et al., 2019) is: 4.7%   Values used to calculate the score:     Age: 24 years     Clincally relevant sex: Female     Is Non-Hispanic African American: Yes     Diabetic: No     Tobacco smoker: No     Systolic Blood Pressure: 138 mmHg     Is BP treated: Yes     HDL Cholesterol: 44 mg/dL     Total Cholesterol: 197 mg/dL  Objective:  Physical Exam HENT:     Head: Normocephalic.  Cardiovascular:     Rate and Rhythm: Normal rate and regular rhythm.  Pulmonary:     Effort: Pulmonary effort is normal.     Breath sounds: Normal breath sounds.  Skin:    General: Skin is warm and dry.  Neurological:     General: No focal deficit present.     Mental Status: She is alert and oriented to person, place, and time.  Psychiatric:        Mood and Affect: Affect is tearful.         Assessment And Plan:  Establishing care with new doctor, encounter for  Influenza vaccination  declined  Pneumococcal vaccination declined  Tetanus, diphtheria, and acellular pertussis (Tdap) vaccination declined  Primary hypertension -     POCT URINALYSIS DIP (CLINITEK) -     CBC -     CMP14+EGFR -     Microalbumin / creatinine urine ratio -     amLODIPine  Besylate; Take 1 tablet (5 mg total) by mouth daily.  Dispense: 90 tablet; Refill: 1  Screening for cardiovascular condition -  Lipid panel  Class 2 severe obesity due to excess calories with serious comorbidity and body mass index (BMI) of 39.0 to 39.9 in adult [facility]) Assessment & Plan: She is encouraged to strive for BMI less than 30 to decrease cardiac risk. Advised to aim for at least 150 minutes of exercise per week.      Return for 3 months, physical.  Patient was given opportunity to ask questions. Patient verbalized understanding of the plan and was able to repeat key elements of the plan. All questions were answered to their satisfaction.    I, Rebecca Creighton, Rebecca Flynn, have reviewed all documentation for this visit. The documentation on 08/02/2024 for the exam, diagnosis, procedures, and orders are all accurate and complete.    IF YOU HAVE BEEN REFERRED TO A SPECIALIST, IT MAY TAKE 1-2 WEEKS TO SCHEDULE/PROCESS THE REFERRAL. IF YOU HAVE NOT HEARD FROM US /SPECIALIST IN TWO WEEKS, PLEASE GIVE US  A CALL AT [phone].

## 2024-07-26 NOTE — Patient Instructions (Signed)
 Hypertension, Adult Hypertension is another name for high blood pressure. High blood pressure forces your heart to work harder to pump blood. This can cause problems over time. There are two numbers in a blood pressure reading. There is a top number (systolic) over a bottom number (diastolic). It is best to have a blood pressure that is below 120/80. What are the causes? The cause of this condition is not known. Some other conditions can lead to high blood pressure. What increases the risk? Some lifestyle factors can make you more likely to develop high blood pressure: Smoking. Not getting enough exercise or physical activity. Being overweight. Having too much fat, sugar, calories, or salt (sodium) in your diet. Drinking too much alcohol. Other risk factors include: Having any of these conditions: Heart disease. Diabetes. High cholesterol. Kidney disease. Obstructive sleep apnea. Having a family history of high blood pressure and high cholesterol. Age. The risk increases with age. Stress. What are the signs or symptoms? High blood pressure may not cause symptoms. Very high blood pressure (hypertensive crisis) may cause: Headache. Fast or uneven heartbeats (palpitations). Shortness of breath. Nosebleed. Vomiting or feeling like you may vomit (nauseous). Changes in how you see. Very bad chest pain. Feeling dizzy. Seizures. How is this treated? This condition is treated by making healthy lifestyle changes, such as: Eating healthy foods. Exercising more. Drinking less alcohol. Your doctor may prescribe medicine if lifestyle changes do not help enough and if: Your top number is above 130. Your bottom number is above 80. Your personal target blood pressure may vary. Follow these instructions at home: Eating and drinking  If told, follow the DASH eating plan. To follow this plan: Fill one half of your plate at each meal with fruits and vegetables. Fill one fourth of your plate  at each meal with whole grains. Whole grains include whole-wheat pasta, brown rice, and whole-grain bread. Eat or drink low-fat dairy products, such as skim milk or low-fat yogurt. Fill one fourth of your plate at each meal with low-fat (lean) proteins. Low-fat proteins include fish, chicken without skin, eggs, beans, and tofu. Avoid fatty meat, cured and processed meat, or chicken with skin. Avoid pre-made or processed food. Limit the amount of salt in your diet to less than 1,500 mg each day. Do not drink alcohol if: Your doctor tells you not to drink. You are pregnant, may be pregnant, or are planning to become pregnant. If you drink alcohol: Limit how much you have to: 0-1 drink a day for women. 0-2 drinks a day for men. Know how much alcohol is in your drink. In the [place] one drink equals one 12 oz bottle of beer (355 mL), one 5 oz glass of wine (148 mL), or one 1 oz glass of hard liquor (44 mL). Lifestyle  Work with your doctor to stay at a healthy weight or to lose weight. Ask your doctor what the best weight is for you. Get at least 30 minutes of exercise that causes your heart to beat faster (aerobic exercise) most days of the week. This may include walking, swimming, or biking. Get at least 30 minutes of exercise that strengthens your muscles (resistance exercise) at least 3 days a week. This may include lifting weights or doing Pilates. Do not smoke or use any products that contain nicotine or tobacco. If you need help quitting, ask your doctor. Check your blood pressure at home as told by your doctor. Keep all follow-up visits. Medicines Take over-the-counter and prescription medicines  only as told by your doctor. Follow directions carefully. Do not skip doses of blood pressure medicine. The medicine does not work as well if you skip doses. Skipping doses also puts you at risk for problems. Ask your doctor about side effects or reactions to medicines that you should watch  for. Contact a doctor if: You think you are having a reaction to the medicine you are taking. You have headaches that keep coming back. You feel dizzy. You have swelling in your ankles. You have trouble with your vision. Get help right away if: You get a very bad headache. You start to feel mixed up (confused). You feel weak or numb. You feel faint. You have very bad pain in your: Chest. Belly (abdomen). You vomit more than once. You have trouble breathing. These symptoms may be an emergency. Get help right away. Call 911. Do not wait to see if the symptoms will go away. Do not drive yourself to the hospital. Summary Hypertension is another name for high blood pressure. High blood pressure forces your heart to work harder to pump blood. For most people, a normal blood pressure is less than 120/80. Making healthy choices can help lower blood pressure. If your blood pressure does not get lower with healthy choices, you may need to take medicine. This information is not intended to replace advice given to you by your health care provider. Make sure you discuss any questions you have with your health care provider. Document Revised: 08/21/2021 Document Reviewed: 08/21/2021 Elsevier Patient Education  2024 [employer].

## 2024-07-27 LAB — CMP14+EGFR
ALT: 61 IU/L — ABNORMAL HIGH (ref 0–32)
AST: 71 IU/L — ABNORMAL HIGH (ref 0–40)
Albumin: 4.2 g/dL (ref 3.9–4.9)
Alkaline Phosphatase: 109 IU/L (ref 44–121)
BUN/Creatinine Ratio: 13 (ref 9–23)
BUN: 10 mg/dL (ref 6–24)
Bilirubin Total: 0.8 mg/dL (ref 0.0–1.2)
CO2: 18 mmol/L — ABNORMAL LOW (ref 20–29)
Calcium: 9.5 mg/dL (ref 8.7–10.2)
Chloride: 106 mmol/L (ref 96–106)
Creatinine, Ser: 0.79 mg/dL (ref 0.57–1.00)
Globulin, Total: 2.9 g/dL (ref 1.5–4.5)
Glucose: 93 mg/dL (ref 70–99)
Potassium: 4.2 mmol/L (ref 3.5–5.2)
Sodium: 143 mmol/L (ref 134–144)
Total Protein: 7.1 g/dL (ref 6.0–8.5)
eGFR: 95 mL/min/1.73 (ref >59)

## 2024-07-27 LAB — CBC
Hematocrit: 46.8 % — ABNORMAL HIGH (ref 34.0–46.6)
Hemoglobin: 15.4 g/dL (ref 11.1–15.9)
MCH: 32.1 pg (ref 26.6–33.0)
MCHC: 32.9 g/dL (ref 31.5–35.7)
MCV: 98 fL — ABNORMAL HIGH (ref 79–97)
Platelets: 355 x10E3/uL (ref 150–450)
RBC: 4.8 x10E6/uL (ref 3.77–5.28)
RDW: 12.8 % (ref 11.7–15.4)
WBC: 9.6 x10E3/uL (ref 3.4–10.8)

## 2024-07-27 LAB — LIPID PANEL
Chol/HDL Ratio: 4.5 ratio — ABNORMAL HIGH (ref 0.0–4.4)
Cholesterol, Total: 197 mg/dL (ref 100–199)
HDL: 44 mg/dL (ref >39)
LDL Chol Calc (NIH): 128 mg/dL — ABNORMAL HIGH (ref 0–99)
Triglycerides: 141 mg/dL (ref 0–149)
VLDL Cholesterol Cal: 25 mg/dL (ref 5–40)

## 2024-07-27 LAB — MICROALBUMIN / CREATININE URINE RATIO
Creatinine, Urine: 157.5 mg/dL
Microalb/Creat Ratio: 10 mg/g{creat} (ref 0–29)
Microalbumin, Urine: 15.9 ug/mL

## 2024-07-28 ENCOUNTER — Other Ambulatory Visit: Payer: Self-pay

## 2024-07-28 ENCOUNTER — Ambulatory Visit
Admission: RE | Admit: 2024-07-28 | Discharge: 2024-07-28 | Disposition: A | Discharge status: Home / Self Care | Source: Ambulatory Visit | Attending: Family Medicine | Admitting: Family Medicine

## 2024-07-28 VITALS — BP 138/91 | HR 84 | Temp 98.7°F | Resp 18

## 2024-07-28 DIAGNOSIS — Z113 Encounter for screening for infections with a predominantly sexual mode of transmission: Secondary | ICD-10-CM | POA: Insufficient documentation

## 2024-07-28 DIAGNOSIS — N760 Acute vaginitis: Secondary | ICD-10-CM | POA: Diagnosis not present

## 2024-07-28 MED ORDER — FLUCONAZOLE 150 MG PO TABS: 150.0000 mg | ORAL_TABLET | ORAL | 0 refills | Status: AC

## 2024-07-28 NOTE — ED Triage Notes (Signed)
 Pt reports a new sexual partner and feels like she has a vag. yeast infection. Pt has had Sx's for 3 days. Pt denies any Vag discharge.

## 2024-07-28 NOTE — Discharge Instructions (Addendum)
 Start fluconazole  to address suspected yeast infection. We'll update you on your results and treat anything you positive for on Monday.

## 2024-07-28 NOTE — ED Provider Notes (Signed)
 [facility]  Note:  This document was prepared using Conservation officer, historic buildings and may include unintentional dictation errors.  MRN: [id number] DOB: [birth year]  Subjective:   Rebecca Flynn is a 42 y.o. female presenting for 3-day history of vaginal itching, vaginal irritation and pain.  Has a history of yeast infection and would like to be treated empirically.  She does have a new sex partner and would like complete STI testing.  Denies fever, n/v, abdominal pain, pelvic pain, rashes, dysuria, urinary frequency, hematuria, vaginal discharge.    No current facility-administered medications for this encounter.  Current Outpatient Medications:    albuterol  (VENTOLIN  HFA) 108 (90 Base) MCG/ACT inhaler, Inhale 2 puffs into the lungs every 4 (four) hours as needed for wheezing or shortness of breath., Disp: 18 g, Rfl: 0   amLODipine  (NORVASC ) 5 MG tablet, Take 1 tablet (5 mg total) by mouth daily., Disp: 90 tablet, Rfl: 1   ibuprofen  (ADVIL ) 600 MG tablet, Take 1 tablet (600 mg total) by mouth every 6 (six) hours as needed., Disp: 30 tablet, Rfl: 0   Multiple Vitamin (MULTIVITAMIN WITH MINERALS) TABS tablet, Take 1 tablet by mouth daily., Disp: , Rfl:    Heating Pad PADS, 1 Pad by Does not apply route continuous as needed. (Patient not taking: Reported on 07/26/2024), Disp: , Rfl:    norethindrone  (CAMILA ) 0.35 MG tablet, Take 1 tablet (0.35 mg total) by mouth daily. At the same time every day (Patient not taking: Reported on 07/26/2024), Disp: 90 tablet, Rfl: 1   Omega-3 Fatty Acids (FISH OIL PO), Take 2 capsules by mouth daily. (Patient not taking: Reported on 07/07/2024), Disp: , Rfl:    Allergies  Allergen Reactions   Penicillins Nausea And Vomiting and Rash    Past Medical History:  Diagnosis Date   Asthma    Bronchitis    Cellulitis of right thigh 09/15/2017   Chlamydia    DUB (dysfunctional uterine bleeding)    Herpes genitalis    Hydrosalpinx     Hypertension    PID (pelvic inflammatory disease)    Pyelonephritis    Trichomoniasis 05/13/2019   Urinary tract infection      Past Surgical History:  Procedure Laterality Date   ANKLE SURGERY     CESAREAN SECTION     x2   ENDOMETRIAL BIOPSY  02/23/2024   HYSTEROSCOPY WITH D & C N/A 06/13/2024   Procedure: DILATATION AND CURETTAGE /HYSTEROSCOPY;  Surgeon: Izell Harari, MD;  Location: [facility] OR;  Service: Gynecology;  Laterality: N/A;  hysteroscopy, d&c, possible myosure polypectomy   WISDOM TOOTH EXTRACTION      Family History  Problem Relation Age of Onset   Heart disease Father    Lung cancer Father    Atrial fibrillation Father    Leukemia Paternal Grandmother    Anesthesia problems Neg Hx    Other Neg Hx     Social History   Tobacco Use   Smoking status: Former    Current packs/day: 0.50    Average packs/day: 0.5 packs/day for 17.0 years (8.5 ttl pk-yrs)    Types: Cigarettes    Passive exposure: Never   Smokeless tobacco: Never  Vaping Use   Vaping status: Never Used  Substance Use Topics   Alcohol use: Yes    Comment: occasionally   Drug use: Not Currently    Types: Marijuana    ROS   Objective:   Vitals: BP (!) 138/91   Pulse 84  Temp 98.7 F (37.1 C)   Resp 18   LMP  (LMP Unknown) Comment: PTG had a DNC  in July 2025 with cauterization  SpO2 95%   Physical Exam Constitutional:      General: She is not in acute distress.    Appearance: Normal appearance. She is well-developed. She is not ill-appearing, toxic-appearing or diaphoretic.  HENT:     Head: Normocephalic and atraumatic.     Nose: Nose normal.     Mouth/Throat:     Mouth: Mucous membranes are moist.  Eyes:     General: No scleral icterus.       Right eye: No discharge.        Left eye: No discharge.     Extraocular Movements: Extraocular movements intact.     Conjunctiva/sclera: Conjunctivae normal.  Cardiovascular:     Rate and Rhythm: Normal rate.  Pulmonary:     Effort:  Pulmonary effort is normal.  Abdominal:     General: Bowel sounds are normal. There is no distension.     Palpations: Abdomen is soft. There is no mass.     Tenderness: There is no abdominal tenderness. There is no right CVA tenderness, left CVA tenderness, guarding or rebound.  Skin:    General: Skin is warm and dry.  Neurological:     General: No focal deficit present.     Mental Status: She is alert and oriented to person, place, and time.  Psychiatric:        Mood and Affect: Mood normal.        Behavior: Behavior normal.        Thought Content: Thought content normal.        Judgment: Judgment normal.     Assessment and Plan :   PDMP not reviewed this encounter.  1. Acute vaginitis   2. Screen for STD (sexually transmitted disease)    Start fluconazole  for empiric treatment of yeast vaginitis.  Will treat as appropriate otherwise based off of lab results.  Counseled patient on potential for adverse effects with medications prescribed/recommended today, ER and return-to-clinic precautions discussed, patient verbalized understanding.    Christopher Savannah, [place] 07/28/24 [phone]

## 2024-07-29 LAB — HIV ANTIBODY (ROUTINE TESTING W REFLEX): HIV Screen 4th Generation wRfx: NONREACTIVE

## 2024-07-29 LAB — RPR: RPR Ser Ql: NONREACTIVE

## 2024-07-31 LAB — CERVICOVAGINAL ANCILLARY ONLY
Bacterial Vaginitis (gardnerella): NEGATIVE
Candida Glabrata: NEGATIVE
Candida Vaginitis: POSITIVE — AB
Chlamydia: NEGATIVE
Comment: NEGATIVE
Comment: NEGATIVE
Comment: NEGATIVE
Comment: NEGATIVE
Comment: NEGATIVE
Comment: NORMAL
Neisseria Gonorrhea: NEGATIVE
Trichomonas: NEGATIVE

## 2024-08-01 ENCOUNTER — Ambulatory Visit (HOSPITAL_COMMUNITY): Payer: Self-pay

## 2024-08-02 ENCOUNTER — Ambulatory Visit: Payer: Self-pay | Admitting: Family Medicine

## 2024-08-02 DIAGNOSIS — Z7689 Persons encountering health services in other specified circumstances: Secondary | ICD-10-CM | POA: Insufficient documentation

## 2024-08-02 DIAGNOSIS — Z2821 Immunization not carried out because of patient refusal: Secondary | ICD-10-CM | POA: Insufficient documentation

## 2024-08-02 NOTE — Assessment & Plan Note (Signed)
 She is encouraged to strive for BMI less than 30 to decrease cardiac risk. Advised to aim for at least 150 minutes of exercise per week.

## 2024-08-02 NOTE — Progress Notes (Signed)
 You had some blood in your urine but it did not indicate any bacteria. Where you on your period? Your liver enzymes are elevated, they seem to be better from 6 months ago but they are still elevated. Do you drink a lot of alcohol ? Use in moderation.  Your bad cholesterol is slightly elevated. Eat a low fat diet and exercise 4-5 times weekly.Your kidney function is normal.  Thank you!

## 2024-08-11 ENCOUNTER — Ambulatory Visit: Payer: Self-pay

## 2024-08-11 VITALS — BP 110/80 | HR 85 | Temp 98.1°F | Ht 64.0 in | Wt 231.0 lb

## 2024-08-11 MED ORDER — WEGOVY 0.25 MG/0.5ML ~~LOC~~ SOAJ: 0.2500 mg | SUBCUTANEOUS | 1 refills | Status: DC

## 2024-08-11 NOTE — Patient Instructions (Signed)
 Semaglutide  Injection (Weight Management) What is this medication? SEMAGLUTIDE  (SEM a GLOO tide) promotes weight loss. It may also be used to maintain weight loss.  It works by decreasing appetite. It can be used to lower the risk of heart attack and stroke in people affected by excess weight. Changes to diet and exercise are often combined with this medication. This medicine may be used for other purposes; ask your health care provider or pharmacist if you have questions. COMMON BRAND NAME(S): Wegovy  What should I tell my care team before I take this medication? They need to know if you have any of these conditions: Diabetes Eye disease caused by diabetes Gallbladder disease Have or have had depression Have or have had pancreatitis Having surgery Kidney disease Personal or family history of MEN 2, a condition that causes endocrine gland tumors Personal or family history of thyroid  cancer Stomach or intestine problems, such as problems digesting food Suicidal thoughts, plans, or attempt An unusual or allergic reaction to semaglutide , other medications, foods, dyes, or preservatives Pregnant or trying to get pregnant Breastfeeding How should I use this medication? This medication is injected under the skin. You will be taught how to prepare and give it. Take it as directed on the prescription label. It is given once every week (every 7 days). Keep taking it unless your care team tells you to stop. It is important that you put your used needles and pens in a special sharps container. Do not put them in a trash can. If you do not have a sharps container, call your pharmacist or care team to get one. A special MedGuide will be given to you by the pharmacist with each prescription and refill. Be sure to read this information carefully each time. This medication comes with INSTRUCTIONS FOR USE. Ask your pharmacist for directions on how to use this medication. Read the information carefully. Talk  to your pharmacist or care team if you have questions. Talk to your care team about the use of this medication in children. While it may be prescribed for children as young as 12 years for selected conditions, precautions do apply. Overdosage: If you think you have taken too much of this medicine contact a poison control center or emergency room at once. NOTE: This medicine is only for you. Do not share this medicine with others. What if I miss a dose? If you miss a dose and the next scheduled dose is more than 2 days away, take the missed dose as soon as possible. If you miss a dose and the next scheduled dose is less than 2 days away, do not take the missed dose. Take the next dose at your regular time. Do not take double or extra doses. If you miss your dose for 2 weeks or more, take the next dose at your regular time or call your care team to talk about how to restart this medication. What may interact with this medication? Insulin and other medications for diabetes This list may not describe all possible interactions. Give your health care provider a list of all the medicines, herbs, non-prescription drugs, or dietary supplements you use. Also tell them if you smoke, drink alcohol, or use illegal drugs. Some items may interact with your medicine. What should I watch for while using this medication? Visit your care team for regular checks on your progress. Tell your care team if your condition does not start to get better or if it gets worse. Tell your care team if  you are taking medication to treat diabetes, such as insulin or glipizide. This may increase your risk of low blood sugar. Know the symptoms of low blood sugar and how to treat it. Talk to your care team about your risk of cancer. You may be more at risk for certain types of cancer if you take this medication. Talk to your care team right away if you have a lump or swelling in your neck, hoarseness that does not go away, trouble  swallowing, shortness of breath, or trouble breathing. Make sure you stay hydrated while taking this medication. Drink water  often. Eat fruits and veggies that have a high water  content. Drink more water  when it is hot or you are active. Talk to your care team right away if you have fever, infection, vomiting, diarrhea, or if you sweat a lot while taking this medication. The loss of too much body fluid may make it dangerous for you to take this medication. If you are going to need surgery or a procedure, tell your care team that you are taking this medication. Do not take this medication without first talking to your care team if you may be or could become pregnant. Your care team can help you find the option that works for you. Weight loss is not recommended during pregnancy. Talk to your care team if you are breastfeeding. When recommended, this medication may be taken. Its use during breastfeeding has not been well studied. Your care team may suggest other options. What side effects may I notice from receiving this medication? Side effects that you should report to your care team as soon as possible: Allergic reactions--skin rash, itching, hives, swelling of the face, lips, tongue, or throat Change in vision Dehydration--increased thirst, dry mouth, feeling faint or lightheaded, headache, dark yellow or brown urine Fast or irregular heartbeat Gallbladder problems--severe stomach pain, nausea, vomiting, fever Kidney injury--decrease in the amount of urine, swelling of the ankles, hands, or feet Pancreatitis--severe stomach pain that spreads to your back or gets worse after eating or when touched, fever, nausea, vomiting Thoughts of suicide or self-harm, worsening mood, feelings of depression Thyroid  cancer--new mass or lump in the neck, pain or trouble swallowing, trouble breathing, hoarseness Side effects that usually do not require medical attention (report these to your care team if they  continue or are bothersome): Constipation Diarrhea Loss of appetite Nausea Upset stomach This list may not describe all possible side effects. Call your doctor for medical advice about side effects. You may report side effects to FDA at 1-800-FDA-1088. Where should I keep my medication? Keep out of the reach of children and pets. Refrigeration (preferred): Store in the refrigerator. Do not freeze. Keep this medication in the original container until you are ready to take it. Get rid of any unused medication after the expiration date. Room temperature: If needed, prior to cap removal, the pen can be stored at room temperature for up to 28 days. Protect from light. If it is stored at room temperature, get rid of any unused medication after 28 days or after it expires, whichever is first. It is important to get rid of the medication as soon as you no longer need it or it is expired. You can do this in two ways: Take the medication to a medication take-back program. Check with your pharmacy or law enforcement to find a location. If you cannot return the medication, follow the directions in the MedGuide. NOTE: This sheet is a summary. It may not  cover all possible information. If you have questions about this medicine, talk to your doctor, pharmacist, or health care provider.  2025 Elsevier/Gold Standard (2023-10-19 00:00:00)

## 2024-08-11 NOTE — Progress Notes (Signed)
 Patient is in office today for a nurse visit for Wegovy  teaching. She is starting off on 0.25mg  dose. She will inject once weekly on Fridays. First injection given today. Patient provided with sample. 4 week follow up scheduled. Prescription sent to pharmacy.

## 2024-08-13 ENCOUNTER — Other Ambulatory Visit: Payer: Self-pay

## 2024-08-13 ENCOUNTER — Emergency Department (HOSPITAL_BASED_OUTPATIENT_CLINIC_OR_DEPARTMENT_OTHER)
Admission: EM | Admit: 2024-08-13 | Discharge: 2024-08-13 | Disposition: A | Discharge status: Home / Self Care | Attending: Emergency Medicine | Admitting: Emergency Medicine

## 2024-08-13 ENCOUNTER — Encounter (HOSPITAL_BASED_OUTPATIENT_CLINIC_OR_DEPARTMENT_OTHER): Payer: Self-pay | Admitting: Emergency Medicine

## 2024-08-13 DIAGNOSIS — Z794 Long term (current) use of insulin: Secondary | ICD-10-CM | POA: Diagnosis not present

## 2024-08-13 DIAGNOSIS — R112 Nausea with vomiting, unspecified: Secondary | ICD-10-CM | POA: Diagnosis not present

## 2024-08-13 DIAGNOSIS — F10988 Alcohol use, unspecified with other alcohol-induced disorder: Secondary | ICD-10-CM | POA: Insufficient documentation

## 2024-08-13 DIAGNOSIS — F10239 Alcohol dependence with withdrawal, unspecified: Secondary | ICD-10-CM | POA: Diagnosis not present

## 2024-08-13 DIAGNOSIS — I10 Essential (primary) hypertension: Secondary | ICD-10-CM | POA: Diagnosis not present

## 2024-08-13 DIAGNOSIS — F10930 Alcohol use, unspecified with withdrawal, uncomplicated: Secondary | ICD-10-CM

## 2024-08-13 DIAGNOSIS — Z79899 Other long term (current) drug therapy: Secondary | ICD-10-CM | POA: Diagnosis not present

## 2024-08-13 DIAGNOSIS — E8729 Other acidosis: Secondary | ICD-10-CM

## 2024-08-13 DIAGNOSIS — J45909 Unspecified asthma, uncomplicated: Secondary | ICD-10-CM | POA: Diagnosis not present

## 2024-08-13 DIAGNOSIS — F10939 Alcohol use, unspecified with withdrawal, unspecified: Secondary | ICD-10-CM | POA: Insufficient documentation

## 2024-08-13 LAB — URINALYSIS, ROUTINE W REFLEX MICROSCOPIC
Bilirubin Urine: NEGATIVE
Glucose, UA: NEGATIVE mg/dL
Ketones, ur: >80 mg/dL — AB
Leukocytes,Ua: NEGATIVE
Nitrite: NEGATIVE
Protein, ur: 30 mg/dL — AB
Specific Gravity, Urine: 1.02 (ref 1.005–1.030)
pH: 5.5 (ref 5.0–8.0)

## 2024-08-13 LAB — I-STAT VENOUS BLOOD GAS, ED
Acid-base deficit: 7 mmol/L — ABNORMAL HIGH (ref 0.0–2.0)
Bicarbonate: 16.5 mmol/L — ABNORMAL LOW (ref 20.0–28.0)
Calcium, Ion: 1.28 mmol/L (ref 1.15–1.40)
HCT: 43 % (ref 36.0–46.0)
Hemoglobin: 14.6 g/dL (ref 12.0–15.0)
O2 Saturation: 60 %
Patient temperature: 98.6
Potassium: 4.5 mmol/L (ref 3.5–5.1)
Sodium: 137 mmol/L (ref 135–145)
TCO2: 17 mmol/L — ABNORMAL LOW (ref 22–32)
pCO2, Ven: 27.4 mmHg — ABNORMAL LOW (ref 44–60)
pH, Ven: 7.387 (ref 7.25–7.43)
pO2, Ven: 31 mmHg — CL (ref 32–45)

## 2024-08-13 LAB — CBC
HCT: 50.1 % — ABNORMAL HIGH (ref 36.0–46.0)
Hemoglobin: 16.7 g/dL — ABNORMAL HIGH (ref 12.0–15.0)
MCH: 31.9 pg (ref 26.0–34.0)
MCHC: 33.3 g/dL (ref 30.0–36.0)
MCV: 95.8 fL (ref 80.0–100.0)
Platelets: 461 K/uL — ABNORMAL HIGH (ref 150–400)
RBC: 5.23 MIL/uL — ABNORMAL HIGH (ref 3.87–5.11)
RDW: 14 % (ref 11.5–15.5)
WBC: 13.3 K/uL — ABNORMAL HIGH (ref 4.0–10.5)
nRBC: 0 % (ref 0.0–0.2)

## 2024-08-13 LAB — BASIC METABOLIC PANEL WITH GFR
Anion gap: 20 — ABNORMAL HIGH (ref 5–15)
BUN: 11 mg/dL (ref 6–20)
CO2: 15 mmol/L — ABNORMAL LOW (ref 22–32)
Calcium: 9.6 mg/dL (ref 8.9–10.3)
Chloride: 102 mmol/L (ref 98–111)
Creatinine, Ser: 0.79 mg/dL (ref 0.44–1.00)
GFR, Estimated: >60 mL/min (ref >60)
Glucose, Bld: 96 mg/dL (ref 70–99)
Potassium: 4.7 mmol/L (ref 3.5–5.1)
Sodium: 137 mmol/L (ref 135–145)

## 2024-08-13 LAB — COMPREHENSIVE METABOLIC PANEL WITH GFR
ALT: 86 U/L — ABNORMAL HIGH (ref 0–44)
AST: 71 U/L — ABNORMAL HIGH (ref 15–41)
Albumin: 4.8 g/dL (ref 3.5–5.0)
Alkaline Phosphatase: 153 U/L — ABNORMAL HIGH (ref 38–126)
Anion gap: 24 — ABNORMAL HIGH (ref 5–15)
BUN: 10 mg/dL (ref 6–20)
CO2: 15 mmol/L — ABNORMAL LOW (ref 22–32)
Calcium: 10.6 mg/dL — ABNORMAL HIGH (ref 8.9–10.3)
Chloride: 101 mmol/L (ref 98–111)
Creatinine, Ser: 0.84 mg/dL (ref 0.44–1.00)
GFR, Estimated: >60 mL/min (ref >60)
Glucose, Bld: 108 mg/dL — ABNORMAL HIGH (ref 70–99)
Potassium: 4.8 mmol/L (ref 3.5–5.1)
Sodium: 140 mmol/L (ref 135–145)
Total Bilirubin: 0.9 mg/dL (ref 0.0–1.2)
Total Protein: 9.2 g/dL — ABNORMAL HIGH (ref 6.5–8.1)

## 2024-08-13 LAB — PREGNANCY, URINE: Preg Test, Ur: NEGATIVE

## 2024-08-13 LAB — LIPASE, BLOOD: Lipase: 21 U/L (ref 11–51)

## 2024-08-13 LAB — RESP PANEL BY RT-PCR (RSV, FLU A&B, COVID)  RVPGX2
Influenza A by PCR: NEGATIVE
Influenza B by PCR: NEGATIVE
Resp Syncytial Virus by PCR: NEGATIVE
SARS Coronavirus 2 by RT PCR: NEGATIVE

## 2024-08-13 LAB — ETHANOL: Alcohol, Ethyl (B): <15 mg/dL (ref <15)

## 2024-08-13 MED ORDER — THIAMINE MONONITRATE 100 MG PO TABS: 100.0000 mg | ORAL_TABLET | Freq: Every day | ORAL | Status: DC

## 2024-08-13 MED ORDER — LORAZEPAM 2 MG/ML IJ SOLN
1.0000 mg | INTRAMUSCULAR | Status: DC | PRN
  Administered 2024-08-13: 4 mg via INTRAVENOUS
  Filled 2024-08-13: qty 2

## 2024-08-13 MED ORDER — FOLIC ACID 1 MG PO TABS: 1.0000 mg | ORAL_TABLET | Freq: Every day | ORAL | Status: DC

## 2024-08-13 MED ORDER — LORAZEPAM 1 MG PO TABS: 1.0000 mg | ORAL_TABLET | ORAL | Status: DC | PRN

## 2024-08-13 MED ORDER — LACTATED RINGERS IV BOLUS
1000.0000 mL | Freq: Once | INTRAVENOUS | Status: AC
  Administered 2024-08-13: 1000 mL via INTRAVENOUS

## 2024-08-13 MED ORDER — THIAMINE HCL 100 MG/ML IJ SOLN
100.0000 mg | Freq: Every day | INTRAMUSCULAR | Status: DC
  Administered 2024-08-13: 100 mg via INTRAVENOUS
  Filled 2024-08-13: qty 2

## 2024-08-13 MED ORDER — ADULT MULTIVITAMIN W/MINERALS CH: 1.0000 | ORAL_TABLET | Freq: Every day | ORAL | Status: DC

## 2024-08-13 MED ORDER — METOCLOPRAMIDE HCL 5 MG/ML IJ SOLN
10.0000 mg | Freq: Once | INTRAMUSCULAR | Status: AC
  Administered 2024-08-13: 10 mg via INTRAVENOUS
  Filled 2024-08-13: qty 2

## 2024-08-13 MED ORDER — ONDANSETRON HCL 4 MG PO TABS: 4.0000 mg | ORAL_TABLET | Freq: Four times a day (QID) | ORAL | 0 refills | Status: DC

## 2024-08-13 MED ORDER — CHLORDIAZEPOXIDE HCL 25 MG PO CAPS: ORAL_CAPSULE | ORAL | 0 refills | Status: AC

## 2024-08-13 NOTE — Discharge Instructions (Addendum)
 You were seen today for nausea and vomiting as well as noted to be in alcohol withdrawal with some Keates acidosis present as well.  This was likely secondary to the vomiting.  I am sending her home with a Librium taper to help with getting you off of alcohol.  You cannot take this with alcohol as this will be life-threatening if you take with alcohol.  Make sure that you continue to follow the tapering instructions as prescribed as this will help prevent further complications from alcohol dependence.  Continue to stay well-hydrated as you were extremely dehydrated upon evaluation today.  Please be sure to follow-up with your PCP for further management.  Also he can follow-up with the behavioral health urgent care and I have right a guide for counseling services to help with alcohol dependence.   DO NOT drive if you feel intoxicated or lethargic.   I am also sending in some nausea medication for you to use as needed.  Please return to the ED if you need to have any new or worsening symptoms which would include tremors, persistent vomiting, persistent diarrhea, hallucinations, uncontrollable pain, chest pain or shortness of breath.

## 2024-08-13 NOTE — Progress Notes (Signed)
 CSW acknowledged consult SUD resources were added. If any further ICM needs arise please put in new consult.   [place] Molley Houser LCSW-A   08/13/2024 3:47 PM

## 2024-08-13 NOTE — ED Triage Notes (Signed)
 Pt caox4 ambulatory c/o vomiting and chills since approx 8a this morning.

## 2024-08-13 NOTE — ED Notes (Signed)
Ginger ale given for fluid challenge 

## 2024-08-13 NOTE — ED Notes (Signed)
 Pt unable to provide a urine sample at this time.

## 2024-08-13 NOTE — ED Provider Notes (Signed)
 [place] EMERGENCY DEPARTMENT AT [facility] Provider Note   CSN: [id number] Arrival date & time: 08/13/24  1235     Patient presents with: Emesis   Rebecca Flynn is a 43 y.o. female.   Emesis Patient is a 43 year old female presenting ED today for concerns for nausea and vomiting that started this morning, noting to have blood streaking in the vomit today after having multiple repeat episodes of emesis.  States that she has had loss of appetite recently over the last 2 days secondary to having a Wegovy  shot 2 days ago.  Notes that she last smoked THC last night and before having symptoms.  Noting that she does drink alcohol daily approximate 1 pint of vodka with last drink last night.  Endorses generalized abdominal pain that localizes to the epigastric region.  Previous medical history of HTN, asthma, pyelonephritis, PID/hydrosalpinx.  Denies fever, blurry vision, chest pain, shortness of breath, cough, congestion, dysphagia, dysuria, diarrhea, melena, hematochezia, vaginal discharge, vaginal bleeding, vaginal pain, lower leg swelling.  Denies SI, HI, A/V hallucinations.     Prior to Admission medications   Medication Sig Start Date End Date Taking? Authorizing Provider  chlordiazePOXIDE (LIBRIUM) 25 MG capsule 50mg  PO TID x 1D, then 25-50mg  PO BID X 1D, then 25-50mg  PO QD X 1D 08/13/24  Yes Marshae Azam S, PA-C  ondansetron  (ZOFRAN ) 4 MG tablet Take 1 tablet (4 mg total) by mouth every 6 (six) hours. 08/13/24  Yes Kenlyn Lose S, PA-C  albuterol  (VENTOLIN  HFA) 108 (90 Base) MCG/ACT inhaler Inhale 2 puffs into the lungs every 4 (four) hours as needed for wheezing or shortness of breath. 09/01/23   Desiderio Chew, PA-C  amLODipine  (NORVASC ) 5 MG tablet Take 1 tablet (5 mg total) by mouth daily. 07/26/24   Petrina Pries, NP  fluconazole  (DIFLUCAN ) 150 MG tablet Take 1 tablet (150 mg total) by mouth every 3 (three) days. 07/28/24   Christopher Savannah, PA-C  Heating Pad PADS 1 Pad by  Does not apply route continuous as needed. Patient not taking: Reported on 08/11/2024 06/13/24   Izell Harari, MD  ibuprofen  (ADVIL ) 600 MG tablet Take 1 tablet (600 mg total) by mouth every 6 (six) hours as needed. 06/13/24   Izell Harari, MD  Multiple Vitamin (MULTIVITAMIN WITH MINERALS) TABS tablet Take 1 tablet by mouth daily.    [provider]  norethindrone  (CAMILA ) 0.35 MG tablet Take 1 tablet (0.35 mg total) by mouth daily. At the same time every day Patient not taking: Reported on 08/11/2024 07/07/24   Izell Harari, MD  Omega-3 Fatty Acids (FISH OIL PO) Take 2 capsules by mouth daily. Patient not taking: Reported on 08/11/2024    [provider]  semaglutide -weight management (WEGOVY ) 0.25 MG/0.5ML SOAJ SQ injection Inject 0.25 mg into the skin once a week. 08/11/24   Petrina Pries, NP    Allergies: Penicillins    Review of Systems  Gastrointestinal:  Positive for vomiting.  All other systems reviewed and are negative.   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  Constitutional:      General: She is not in acute distress.    Appearance: Normal appearance. She is not ill-appearing or diaphoretic.  HENT:     Head: Normocephalic  and atraumatic.  Eyes:     General: No scleral icterus.       Right eye: No discharge.        Left eye: No discharge.     Extraocular Movements: Extraocular movements intact.     Conjunctiva/sclera: Conjunctivae normal.  Cardiovascular:     Rate and Rhythm: Regular rhythm. Tachycardia present.     Pulses: Normal pulses.     Heart sounds: Normal heart sounds. No murmur heard.    No friction rub. No gallop.  Pulmonary:     Effort: Pulmonary effort is normal. No respiratory distress.     Breath sounds: No stridor. No wheezing, rhonchi or rales.   Chest:     Chest wall: No tenderness.  Abdominal:     General: Abdomen is flat. There is no distension.     Palpations: Abdomen is soft.     Tenderness: There is generalized abdominal tenderness and tenderness in the epigastric area. There is no right CVA tenderness, left CVA tenderness, guarding or rebound. Negative signs include Murphy's sign, Rovsing's sign, McBurney's sign and psoas sign.  Musculoskeletal:        General: No swelling, deformity or signs of injury.     Cervical back: Normal range of motion. No rigidity.     Right lower leg: No edema.     Left lower leg: No edema.  Skin:    General: Skin is warm and dry.     Findings: No bruising, erythema or lesion.  Neurological:     General: No focal deficit present.     Mental Status: She is alert and oriented to person, place, and time.     Sensory: No sensory deficit.     Motor: No weakness.     Comments: Notably has bilateral tremors to both upper and lower extremities and tongue fasciculations  Psychiatric:        Mood and Affect: Mood normal.     (all labs ordered are listed, but only abnormal results are displayed) Labs Reviewed  COMPREHENSIVE METABOLIC PANEL WITH GFR - Abnormal; Notable for the following components:      Result Value   CO2 15 (*)    Glucose, Bld 108 (*)    Calcium 10.6 (*)    Total Protein 9.2 (*)    AST 71 (*)    ALT 86 (*)    Alkaline Phosphatase 153 (*)    Anion gap 24 (*)    All other components within normal limits  CBC - Abnormal; Notable for the following components:   WBC 13.3 (*)    RBC 5.23 (*)    Hemoglobin 16.7 (*)    HCT 50.1 (*)    Platelets 461 (*)    All other components within normal limits  URINALYSIS, ROUTINE W REFLEX MICROSCOPIC - Abnormal; Notable for the following components:   Hgb urine dipstick LARGE (*)    Ketones, ur >80 (*)    Protein, ur 30 (*)    Bacteria, UA RARE (*)    All other components within normal limits  BASIC METABOLIC PANEL WITH GFR - Abnormal;  Notable for the following components:   CO2 15 (*)    Anion gap 20 (*)    All other components within normal limits  I-STAT VENOUS BLOOD GAS, ED - Abnormal; Notable for the following components:   pCO2, Ven 27.4 (*)    pO2, Ven 31 (*)    Bicarbonate 16.5 (*)    TCO2 17 (*)  Acid-base deficit 7.0 (*)    All other components within normal limits  RESP PANEL BY RT-PCR (RSV, FLU A&B, COVID)  RVPGX2  LIPASE, BLOOD  PREGNANCY, URINE  ETHANOL    EKG: None  Radiology: No results found.   Procedures   Medications Ordered in the ED  LORazepam  (ATIVAN ) tablet 1-4 mg ( Oral See Alternative 08/13/24 1512)    Or  LORazepam  (ATIVAN ) injection 1-4 mg (4 mg Intravenous Given 08/13/24 1512)  thiamine (VITAMIN B1) tablet 100 mg ( Oral See Alternative 08/13/24 1503)    Or  thiamine (VITAMIN B1) injection 100 mg (100 mg Intravenous Given 08/13/24 1503)  folic acid (FOLVITE) tablet 1 mg (has no administration in time range)  multivitamin with minerals tablet 1 tablet (has no administration in time range)  metoCLOPramide (REGLAN) injection 10 mg (10 mg Intravenous Given 08/13/24 1503)  lactated ringers  bolus 1,000 mL (0 mLs Intravenous Stopped 08/13/24 1645)  lactated ringers  bolus 1,000 mL (0 mLs Intravenous Stopped 08/13/24 1923)                                   Medical Decision Making Amount and/or Complexity of Data Reviewed Labs: ordered.  Risk OTC drugs. Prescription drug management.   This patient is a 43 year old female who presents to the ED for concern of nausea and vomiting, with withdrawal symptoms noting to have had just darted on Wegovy  on Friday, had THC before symptoms as well as THC during symptoms as well as had alcohol during her symptoms of nausea and vomiting.  Had not had anything to eat since 2 days ago and had had minimal fluids.  On physical exam, patient is in no acute distress, afebrile, alert and orient x 4, speaking in full sentence.  Notably tachycardic and  mildly tachypneic.  Tremors noted bilateral upper and lower extremities with tongue fasciculations.  Mild epigastric abdominal tenderness to palpation.  Unremarkable exam otherwise.  Patient was provided Ativan , fluids during to be severely dehydrated and in withdrawal.  Did have initial CIWA score of 19.  Also providing nausea medications.  On reevaluation she has had greatly improved symptoms, no longer tachycardic but was obviously impaired from the Ativan .  Provided more fluids at that time.   Recheck VBG and BMP after fluids to check for anion gap.   Improving anion gap, patient tolerating p.o. intake of both food and fluids.  Patient was ambulated and did well.  Did seem to be slightly impaired still from the benzodiazepines.  However with her wishing to stop alcohol, will send home with Librium taper per her request with her requesting to stop alcohol.  If she precautions around her stopping and not taking with alcohol.  Patient is questing to leave at this time.  Monitored for extended period with improvement of symptoms.  Patient did still appear to be impaired from the Ativan .  And requested to have her picked up by a friend who came and picked her up.  Vital signs have normalized.  Gave she precautions not to drive while impaired.  And to follow-up with PCP.  Patient vital signs have remained stable throughout the course of patient's time in the ED. Low suspicion for any other emergent pathology at this time. I believe this patient is safe to be discharged. Provided strict return to ER precautions. Patient expressed agreement and understanding of plan. All questions were answered.  Differential diagnoses prior to evaluation:  The emergent differential diagnosis includes, but is not limited to, alcohol withdrawal, cannabinoid hyperemesis, secondary drug reaction, metabolic disturbance, PUD, gastritis, pancreatitis, gastroparesis, malignancy, biliary disease, ACS, pericarditis, pneumonia,  intestinal ischemia, esophageal rupture, hepatitis, pregnancy . This is not an exhaustive differential.   Past Medical History / Co-morbidities / Social History: Hydrosalpinx/PID, HTN, pyelonephritis, asthma  Additional history: Chart reviewed. Pertinent results include:  Last seen by urgent care on 07/28/2024 for acute vaginitis treated with fluconazole .  Lab Tests/Imaging studies: I personally interpreted labs/imaging and the pertinent results include:    CBC shows an elevated white count at 13.3 as well as an elevated, elevated RBC of 5.23, elevated hemoglobin 16.7 and elevated platelets of 461 likely secondary to hyper concentration CMP notes elevated calcium 10.6, mild transaminitis, anion gap of 24, lipase 21 Respiratory panel negative UA notes ketones indicating alcoholic starvation ketoacidosis   BMP shows improving anion gap VBG shows normal pH Ethanol level unremarkable Respiratory panel negative    Medications: I ordered medication including Ativan , thiamine, folic acid, lorazepam , Reglan.  I have reviewed the patients home medicines and have made adjustments as needed.  Critical Interventions: None  Social Determinants of Health: None  Disposition: After consideration of the diagnostic results and the patients response to treatment, I feel that the patient would benefit from discharge and treatment as above.   emergency department workup does not suggest an emergent condition requiring admission or immediate intervention beyond what has been performed at this time. The plan is: Librium taper, follow-up with PCP, return for new or worsening symptoms. The patient is safe for discharge and has been instructed to return immediately for worsening symptoms, change in symptoms or any other concerns.   Final diagnoses:  Nausea and vomiting, unspecified vomiting type  Alcohol withdrawal syndrome without complication (HCC)  Alcoholic ketoacidosis    ED Discharge Orders           Ordered    chlordiazePOXIDE (LIBRIUM) 25 MG capsule        08/13/24 2127    ondansetron  (ZOFRAN ) 4 MG tablet  Every 6 hours        08/13/24 2128               Beola Terrall RAMAN, [place] 08/13/24 2344    Cottie Donnice PARAS, MD 08/17/24 [phone]

## 2024-08-14 ENCOUNTER — Other Ambulatory Visit: Payer: Self-pay

## 2024-08-14 DIAGNOSIS — I10 Essential (primary) hypertension: Secondary | ICD-10-CM

## 2024-08-14 MED ORDER — AMLODIPINE BESYLATE 5 MG PO TABS: 5.0000 mg | ORAL_TABLET | Freq: Every day | ORAL | 1 refills | Status: AC

## 2024-08-17 ENCOUNTER — Encounter: Payer: Self-pay | Admitting: Obstetrics and Gynecology

## 2024-08-25 ENCOUNTER — Encounter: Payer: Self-pay | Admitting: Obstetrics and Gynecology

## 2024-09-06 ENCOUNTER — Encounter: Payer: Self-pay | Admitting: Obstetrics and Gynecology

## 2024-09-08 ENCOUNTER — Ambulatory Visit: Admitting: Family Medicine

## 2024-09-11 ENCOUNTER — Other Ambulatory Visit: Payer: Self-pay | Admitting: Obstetrics and Gynecology

## 2024-09-11 MED ORDER — MEGESTROL ACETATE 40 MG PO TABS
40.0000 mg | ORAL_TABLET | Freq: Two times a day (BID) | ORAL | 1 refills | Status: AC
Start: 2024-09-11 — End: ?

## 2024-09-13 ENCOUNTER — Encounter: Payer: Self-pay | Admitting: Family Medicine

## 2024-09-13 ENCOUNTER — Ambulatory Visit (INDEPENDENT_AMBULATORY_CARE_PROVIDER_SITE_OTHER): Admitting: Family Medicine

## 2024-09-13 VITALS — BP 112/80 | HR 76 | Temp 98.2°F | Ht 64.0 in | Wt 226.0 lb

## 2024-09-13 DIAGNOSIS — Z6839 Body mass index (BMI) 39.0-39.9, adult: Secondary | ICD-10-CM

## 2024-09-13 DIAGNOSIS — F330 Major depressive disorder, recurrent, mild: Secondary | ICD-10-CM | POA: Diagnosis not present

## 2024-09-13 DIAGNOSIS — I10 Essential (primary) hypertension: Secondary | ICD-10-CM

## 2024-09-13 DIAGNOSIS — E66812 Obesity, class 2: Secondary | ICD-10-CM | POA: Diagnosis not present

## 2024-09-13 DIAGNOSIS — F1090 Alcohol use, unspecified, uncomplicated: Secondary | ICD-10-CM

## 2024-09-13 DIAGNOSIS — E6609 Other obesity due to excess calories: Secondary | ICD-10-CM

## 2024-09-13 MED ORDER — ESCITALOPRAM OXALATE 10 MG PO TABS: 10.0000 mg | ORAL_TABLET | Freq: Every day | ORAL | 2 refills | Status: DC

## 2024-09-13 MED ORDER — WEGOVY 0.25 MG/0.5ML ~~LOC~~ SOAJ: 0.2500 mg | SUBCUTANEOUS | 1 refills | Status: AC

## 2024-09-13 NOTE — Progress Notes (Signed)
 I,Rebecca Flynn, CMA,acting as a neurosurgeon for [employer], NP.,have documented all relevant documentation on the behalf of Rebecca Creighton, NP,as directed by  Rebecca Creighton, NP while in the presence of Rebecca Creighton, NP.  Subjective:  Patient ID: Rebecca Flynn , female    DOB: 07-May-1981 , 43 y.o.   MRN: [id number]  Chief Complaint  Patient presents with   Weight Check    Patient presents today for weight check. Patient is tolerating the wegovy  well. She has been exercising and eating correctly.     HPI Discussed the use of AI scribe software for clinical note transcription with the patient, who gave verbal consent to proceed.  History of Present Illness    Rebecca Flynn is a 43 year old female who presents for follow-up and management of her medications.  She has hypertension, which is well-controlled with her current medication regimen.  She has experienced a decreased appetite recently, which she attributes to her work schedule. She has been using Wegovy  for about a month and a half, which she associates with episodes of nausea and vomiting. A recent emergency room visit for nausea and vomiting was initially thought to be due to food poisoning. She was prescribed medication for alcohol withdrawal, which she did not take.  She has a history of fibroid surgery performed on July 29th. Post-surgery, she was placed on Megace  to manage bleeding issues, as birth control pills exacerbated her bleeding. She takes Megace  as needed when her periods do not resolve on their own.  She consumes alcohol occasionally but denies daily use, despite emergency room records indicating otherwise. On 08/13/2024, she was treated at the ED for nausea/vomiting related to alcohol consumption and she was started on Librium 50 mg taper dose, but patient states that her friend who is an LPN asked her not to take it and she did not.   She also uses marijuana occasionally. She is experiencing stress related to her father's  ongoing cancer treatment, which has been a two to three-year process involving brain and lung tumors. As the only child, she feels the pressure of being the primary support for her father.  She is experiencing stress and possible depression, which she attributes to her father's illness and her responsibilities. She acknowledges feeling depressed but denies anxiety. She is trying to maintain her health by going to the gym three times a week and has lost approximately eight pounds since her last visit.  Suggested therapy to helped her.She voiced understanding and agreed to see one.  Patient and/or legal guardian verbally consented to [facility] services about presenting concerns and psychiatric consultation as appropriate.  The services will be billed as appropriate for the patient       Past Medical History:  Diagnosis Date   Asthma    Bronchitis    Cellulitis of right thigh 09/15/2017   Chlamydia    DUB (dysfunctional uterine bleeding)    Herpes genitalis    Hydrosalpinx    Hypertension    PID (pelvic inflammatory disease)    Pyelonephritis    Trichomoniasis 05/13/2019   Urinary tract infection      Family History  Problem Relation Age of Onset   Heart disease Father    Lung cancer Father    Atrial fibrillation Father    Leukemia Paternal Grandmother    Anesthesia problems Neg Hx    Other Neg Hx      Current Outpatient Medications:    albuterol  (VENTOLIN  HFA)  108 (90 Base) MCG/ACT inhaler, Inhale 2 puffs into the lungs every 4 (four) hours as needed for wheezing or shortness of breath., Disp: 18 g, Rfl: 0   amLODipine  (NORVASC ) 5 MG tablet, Take 1 tablet (5 mg total) by mouth daily., Disp: 90 tablet, Rfl: 1   escitalopram (LEXAPRO) 10 MG tablet, Take 1 tablet (10 mg total) by mouth daily., Disp: 30 tablet, Rfl: 2   megestrol  (MEGACE ) 40 MG tablet, Take 1 tablet (40 mg total) by mouth 2 (two) times daily. You can stop the pill once the bleeding  stops, Disp: 60 tablet, Rfl: 1   Multiple Vitamin (MULTIVITAMIN WITH MINERALS) TABS tablet, Take 1 tablet by mouth daily., Disp: , Rfl:    Omega-3 Fatty Acids (FISH OIL PO), Take 2 capsules by mouth daily., Disp: , Rfl:    chlordiazePOXIDE (LIBRIUM) 25 MG capsule, 50mg  PO TID x 1D, then 25-50mg  PO BID X 1D, then 25-50mg  PO QD X 1D (Patient not taking: Reported on 09/13/2024), Disp: 10 capsule, Rfl: 0   fluconazole  (DIFLUCAN ) 150 MG tablet, Take 1 tablet (150 mg total) by mouth every 3 (three) days. (Patient not taking: Reported on 09/13/2024), Disp: 2 tablet, Rfl: 0   Heating Pad PADS, 1 Pad by Does not apply route continuous as needed. (Patient not taking: Reported on 09/13/2024), Disp: , Rfl:    semaglutide -weight management (WEGOVY ) 0.25 MG/0.5ML SOAJ SQ injection, Inject 0.25 mg into the skin once a week., Disp: 2 mL, Rfl: 1   Allergies  Allergen Reactions   Penicillins Nausea And Vomiting and Rash     Review of Systems  Constitutional: Negative.   HENT: Negative.    Respiratory: Negative.    Gastrointestinal: Negative.   Psychiatric/Behavioral:  Positive for dysphoric mood. The patient is nervous/anxious.      Today's Vitals   09/13/24 0918  BP: 112/80  Pulse: 76  Temp: 98.2 F (36.8 C)  TempSrc: Oral  Weight: 226 lb (102.5 kg)  Height: 5' 4 (1.626 m)  PainSc: 0-No pain   Body mass index is 38.79 kg/m.  Wt Readings from Last 3 Encounters:  09/13/24 226 lb (102.5 kg)  08/11/24 231 lb (104.8 kg)  07/26/24 231 lb (104.8 kg)    The 10-year ASCVD risk score (Arnett DK, et al., 2019) is: 1.8%   Values used to calculate the score:     Age: 88 years     Clincally relevant sex: Female     Is Non-Hispanic African American: Yes     Diabetic: No     Tobacco smoker: No     Systolic Blood Pressure: 112 mmHg     Is BP treated: Yes     HDL Cholesterol: 44 mg/dL     Total Cholesterol: 197 mg/dL  Objective:  Physical Exam Constitutional:      Appearance: Normal appearance.   HENT:     Head: Normocephalic.  Cardiovascular:     Rate and Rhythm: Normal rate and regular rhythm.     Pulses: Normal pulses.     Heart sounds: Normal heart sounds.  Pulmonary:     Effort: Pulmonary effort is normal.     Breath sounds: Normal breath sounds.  Abdominal:     General: Bowel sounds are normal.  Skin:    General: Skin is warm and dry.  Neurological:     Mental Status: She is alert and oriented to person, place, and time. Mental status is at baseline.         Assessment  And Plan:  Major depressive disorder, recurrent episode, mild with anxious distress Assessment & Plan: Reports depression likely related to stress from father's illness. Denies anxiety. Discussed impact on daily functioning and importance of seeking help. - Prescribe medication for depression. - Refer to counseling for depression.  Orders: -     Escitalopram Oxalate; Take 1 tablet (10 mg total) by mouth daily.  Dispense: 30 tablet; Refill: 2 -     Amb ref to Integrated [facility]al [facility]  Primary hypertension Assessment & Plan: -Blood pressure well-controlled at 112/80 mmHg.  -Adherent to antihypertensive regimen. - Continue current antihypertensive medication regimen.   Alcohol use Assessment & Plan: -Diagnosed with alcohol withdrawal syndrome in the ER. Denies daily alcohol use.  -Reluctant to accept diagnosis and did not take prescribed Librium. - Discussed importance of acknowledging alcohol use and potential need for treatment. - Offered referral to counseling for alcohol use disorder.    Class 2 severe obesity due to excess calories with serious comorbidity and body mass index (BMI) of 39.0 to 39.9 in adult Assessment & Plan: -On Wegovy  for weight management. Reports decreased appetite and 8-pound weight loss. Exercises regularly.  - Continue current dose of Wegovy . - Monitor for side effects such as nausea and vomiting.  Orders: -     Wegovy ; Inject 0.25 mg into the skin once a  week.  Dispense: 2 mL; Refill: 1    Assessment & Plan Alcohol use disorder Diagnosed with alcohol withdrawal syndrome. Denies daily alcohol use. Reluctant to accept diagnosis and did not take prescribed Librium. - Discussed importance of acknowledging alcohol use and potential need for treatment. - Offered referral to counseling for alcohol use disorder.  Depression Reports depression likely related to stress from father's illness. Denies anxiety. Discussed impact on daily functioning and importance of seeking help. - Prescribe medication for depression. - Refer to counseling for depression.  Obesity, class 2 On Wegovy  for weight management. Reports decreased appetite and 8-pound weight loss. Exercises regularly. Discussed potential side effects of Wegovy  and importance of informing healthcare providers during emergency visits. - Continue current dose of Wegovy . - Monitor for side effects such as nausea and vomiting.  Essential hypertension Blood pressure well-controlled at 112/80 mmHg. Adherent to antihypertensive regimen. - Continue current antihypertensive medication regimen.   Return for 2 month weight check; schedule with CHRISTINA.  Patient was given opportunity to ask questions. Patient verbalized understanding of the plan and was able to repeat key elements of the plan. All questions were answered to their satisfaction.    I, Rebecca Creighton, NP, have reviewed all documentation for this visit. The documentation on 09/18/2024 for the exam, diagnosis, procedures, and orders are all accurate and complete.    IF YOU HAVE BEEN REFERRED TO A SPECIALIST, IT MAY TAKE 1-2 WEEKS TO SCHEDULE/PROCESS THE REFERRAL. IF YOU HAVE NOT HEARD FROM US /SPECIALIST IN TWO WEEKS, PLEASE GIVE US  A CALL AT [phone].

## 2024-09-13 NOTE — Patient Instructions (Signed)

## 2024-09-18 DIAGNOSIS — F1090 Alcohol use, unspecified, uncomplicated: Secondary | ICD-10-CM | POA: Insufficient documentation

## 2024-09-18 NOTE — Assessment & Plan Note (Signed)
-  Blood pressure well-controlled at 112/80 mmHg.  -Adherent to antihypertensive regimen. - Continue current antihypertensive medication regimen.

## 2024-09-18 NOTE — Assessment & Plan Note (Signed)
 Reports depression likely related to stress from father's illness. Denies anxiety. Discussed impact on daily functioning and importance of seeking help. - Prescribe medication for depression. - Refer to counseling for depression.

## 2024-09-18 NOTE — Assessment & Plan Note (Signed)
-  On Wegovy  for weight management. Reports decreased appetite and 8-pound weight loss. Exercises regularly.  - Continue current dose of Wegovy . - Monitor for side effects such as nausea and vomiting.

## 2024-09-18 NOTE — Assessment & Plan Note (Signed)
-  Diagnosed with alcohol withdrawal syndrome in the ER. Denies daily alcohol use.  -Reluctant to accept diagnosis and did not take prescribed Librium. - Discussed importance of acknowledging alcohol use and potential need for treatment. - Offered referral to counseling for alcohol use disorder.

## 2024-10-05 ENCOUNTER — Ambulatory Visit: Payer: Self-pay | Admitting: Licensed Clinical Social Worker

## 2024-10-05 ENCOUNTER — Institutional Professional Consult (permissible substitution): Payer: Self-pay | Admitting: Licensed Clinical Social Worker

## 2024-10-05 DIAGNOSIS — F331 Major depressive disorder, recurrent, moderate: Secondary | ICD-10-CM

## 2024-10-05 DIAGNOSIS — F411 Generalized anxiety disorder: Secondary | ICD-10-CM
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
## 2024-10-05 NOTE — Patient Instructions (Signed)
 Using Behavioral Activation to manage stress/depression symptoms    Identify/understand your own mood triggers.   Structure your day--get up around the same time, eat meals/snacks around the same time, go to bed around the same time.   Purposefully schedule self care time and time to complete tasks. This can include quiet time  Stimulate your brain--go for a walk, text/call a friend or family member, if you are indoors--go outside (and vice versa), go for a drive, go to a store with bright colors and bright lights. Try to do things in a different way--drive to your favorite places using an alternative route, or instead of starting on the right side of the grocery store when shopping, start on the left side. You might feel a bit uncomfortable doing things outside of the comfort zone, but this is helping the brain create new neural pathways and is very healthy for brain/emotional health.   Physical movement based on your ability. If you can go for a walk, do stretches, even waving your hands to music can trigger feel-good endorphins in the brain and help release physical tension we all hold in our bodies.  Even 5 minutes can make a difference.   Be intentional about doing things that bring you joy (or used to bring you joy), and look for the things in every day that make you happy.  Seek those glimmers of joy each day.  Set a timer for 5 minutes for a harder task (ex. Laundry, washing dishes).  Allow yourself to work distraction-free for 5 minutes, then stop when the timer goes off. If you need a break, take a break. If you want to continue working then set another timer for whatever time you choose.   Limit or eliminate substance use including alcohol, marijuana, or recreational use of prescription medication.  Let in the light!! Open the window blinds, curtains and let natural light in. Even sitting near a window or sitting outside can boost your mood, especially in the wintertime when there  is less daylight.    Things to envision for ourselves to to improve inspiration, motivation, and initiative :  improving physical wellness, focus on family relationships, focusing on our own mental/emotional well being, being a part of a bigger community, finding a hobby, being a part of something that fosters personal growth, engaging socially with others (even digitally!!!)    ANXIETY/PANIC EPISODE MANAGEMENT (CBT/MINDFULNESS BASED)   If you are in a highly stimulating or triggering environment, change your location to a less stimulating or safer environment .   Stimulate your senses by tasting/eating a sour candy (such as a lemon drop) or a strong flavored cough drop.  This triggers smell, taste, and touch since we  have had lots of nerve endings inside of your mouth.   Practice slow, controlled breathing to avoid hyperventilation.  Breathing into your nose for the count of 4 inside your head, holding your breath for the count of 4 inside your head, and exhale slowly counting to 8 inside your head.  This is called 4-4-8 breathing, or triangle breathing. (Controlled breathing). This helps manage panic, anxiety, anger, and tearfulness.  Additional grounding exercises include rubbing your hands softly together, or wiggling toes inside your shoes, pretending to grasp the floor with your toes.    Listen to calming music or sounds  Count backwards in your head by twos or tens or recite multiplication tables in your head.  Visualize a calming, happy place and identify 5 explicit details about this  place (color, temperature, smells, visual details, etc.)  Splash cold water on your face or hold an ice cube in your hand (alternate hands)  Clench and release muscle groups (hands, shoulders, facial muscles)  Engage in soothing activities to recover after a stressful/anxious episode such as: drinking a warm beverage, sitting in your favorite comfortable location, positive physical contact with  a pet or weighted blanket, using positive self talk/positive affirmations.   Download PTSD Coach app for your phone or tablet--its free and has lots of tips to help you manage panic episodes no matter where you are!      Emergency Resources:  National Suicide & Crisis Lifeline: Call or text 988  Crisis Text Line: Text HOME to 810-732-7215  [facility]  [address], [place], [place] [address] [phone] or [phone] [facility] 24/7 FOR ANYONE [address], [place], [place]  [phone] Fax: [phone] [email or web address] *Interpreters available *Accepts all insurance and uninsured for Urgent Care needs *Accepts Medicaid and uninsured for outpatient treatment (below)

## 2024-10-09 ENCOUNTER — Encounter: Payer: Self-pay | Admitting: Family Medicine

## 2024-10-09 ENCOUNTER — Other Ambulatory Visit: Payer: Self-pay

## 2024-10-09 MED ORDER — ALBUTEROL SULFATE HFA 108 (90 BASE) MCG/ACT IN AERS: 2.0000 | INHALATION_SPRAY | RESPIRATORY_TRACT | 0 refills | Status: AC | PRN

## 2024-10-11 ENCOUNTER — Telehealth (INDEPENDENT_AMBULATORY_CARE_PROVIDER_SITE_OTHER): Payer: Self-pay | Admitting: Licensed Clinical Social Worker

## 2024-10-11 DIAGNOSIS — F411 Generalized anxiety disorder: Secondary | ICD-10-CM

## 2024-10-11 DIAGNOSIS — F331 Major depressive disorder, recurrent, moderate: Secondary | ICD-10-CM
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
## 2024-10-17 ENCOUNTER — Ambulatory Visit: Payer: Self-pay | Admitting: Licensed Clinical Social Worker

## 2024-10-19 ENCOUNTER — Other Ambulatory Visit: Payer: Self-pay | Admitting: Family Medicine

## 2024-10-19 MED ORDER — GABAPENTIN 100 MG PO CAPS: 100.0000 mg | ORAL_CAPSULE | Freq: Three times a day (TID) | ORAL | 2 refills | Status: AC | PRN

## 2024-10-19 MED ORDER — NALTREXONE HCL 50 MG PO TABS: 50.0000 mg | ORAL_TABLET | Freq: Every day | ORAL | 2 refills | Status: AC

## 2024-10-20 ENCOUNTER — Encounter: Payer: Self-pay | Admitting: Family Medicine

## 2024-10-25 ENCOUNTER — Ambulatory Visit: Payer: Self-pay | Admitting: Family Medicine

## 2024-10-25 ENCOUNTER — Encounter: Payer: Self-pay | Admitting: Family Medicine

## 2024-10-25 VITALS — BP 116/80 | HR 78 | Temp 98.2°F | Ht 64.0 in | Wt 224.0 lb

## 2024-10-25 DIAGNOSIS — F17200 Nicotine dependence, unspecified, uncomplicated: Secondary | ICD-10-CM | POA: Diagnosis not present

## 2024-10-25 DIAGNOSIS — Z1211 Encounter for screening for malignant neoplasm of colon: Secondary | ICD-10-CM

## 2024-10-25 DIAGNOSIS — Z1159 Encounter for screening for other viral diseases: Secondary | ICD-10-CM

## 2024-10-25 DIAGNOSIS — E559 Vitamin D deficiency, unspecified: Secondary | ICD-10-CM

## 2024-10-25 DIAGNOSIS — E66812 Obesity, class 2: Secondary | ICD-10-CM

## 2024-10-25 DIAGNOSIS — Z136 Encounter for screening for cardiovascular disorders: Secondary | ICD-10-CM

## 2024-10-25 DIAGNOSIS — F1090 Alcohol use, unspecified, uncomplicated: Secondary | ICD-10-CM

## 2024-10-25 DIAGNOSIS — I10 Essential (primary) hypertension: Secondary | ICD-10-CM

## 2024-10-25 DIAGNOSIS — Z1231 Encounter for screening mammogram for malignant neoplasm of breast: Secondary | ICD-10-CM | POA: Diagnosis not present

## 2024-10-25 DIAGNOSIS — F331 Major depressive disorder, recurrent, moderate: Secondary | ICD-10-CM

## 2024-10-25 DIAGNOSIS — Z0000 Encounter for general adult medical examination without abnormal findings: Secondary | ICD-10-CM

## 2024-10-25 NOTE — Progress Notes (Unsigned)
 I,Jameka J Llittleton, CMA,acting as a neurosurgeon for [employer], NP.,have documented all relevant documentation on the behalf of Bruna Creighton, NP,as directed by  Bruna Creighton, NP while in the presence of Bruna Creighton, NP.  Subjective:    Patient ID: Rebecca Flynn , female    DOB: 1981/03/07 , 43 y.o.   MRN: [id number]  Chief Complaint  Patient presents with   Annual Exam    Patient presents today for a physical. Patient reports compliance with her meds. Patient denies having chest pain,sob or headaches at this time. Patient doesn't have any questions or concerns at this time.    HPI  HPI   Past Medical History:  Diagnosis Date   Asthma    Bronchitis    Cellulitis of right thigh 09/15/2017   Chlamydia    DUB (dysfunctional uterine bleeding)    Herpes genitalis    Hydrosalpinx    Hypertension    PID (pelvic inflammatory disease)    Pyelonephritis    Trichomoniasis 05/13/2019   Urinary tract infection      Family History  Problem Relation Age of Onset   Heart disease Father    Lung cancer Father    Atrial fibrillation Father    Leukemia Paternal Grandmother    Anesthesia problems Neg Hx    Other Neg Hx      Current Outpatient Medications:    albuterol  (VENTOLIN  HFA) 108 (90 Base) MCG/ACT inhaler, Inhale 2 puffs into the lungs every 4 (four) hours as needed for wheezing or shortness of breath., Disp: 18 g, Rfl: 0   amLODipine  (NORVASC ) 5 MG tablet, Take 1 tablet (5 mg total) by mouth daily., Disp: 90 tablet, Rfl: 1   chlordiazePOXIDE  (LIBRIUM ) 25 MG capsule, 50mg  PO TID x 1D, then 25-50mg  PO BID X 1D, then 25-50mg  PO QD X 1D (Patient not taking: Reported on 09/13/2024), Disp: 10 capsule, Rfl: 0   escitalopram  (LEXAPRO ) 10 MG tablet, Take 1 tablet (10 mg total) by mouth daily., Disp: 30 tablet, Rfl: 2   fluconazole  (DIFLUCAN ) 150 MG tablet, Take 1 tablet (150 mg total) by mouth every 3 (three) days. (Patient not taking: Reported on 09/13/2024), Disp: 2 tablet, Rfl: 0   gabapentin   (NEURONTIN ) 100 MG capsule, Take 1 capsule (100 mg total) by mouth every 8 (eight) hours as needed., Disp: 30 capsule, Rfl: 2   Heating Pad PADS, 1 Pad by Does not apply route continuous as needed. (Patient not taking: Reported on 09/13/2024), Disp: , Rfl:    megestrol  (MEGACE ) 40 MG tablet, Take 1 tablet (40 mg total) by mouth 2 (two) times daily. You can stop the pill once the bleeding stops, Disp: 60 tablet, Rfl: 1   Multiple Vitamin (MULTIVITAMIN WITH MINERALS) TABS tablet, Take 1 tablet by mouth daily., Disp: , Rfl:    naltrexone  (DEPADE) 50 MG tablet, Take 1 tablet (50 mg total) by mouth daily., Disp: 30 tablet, Rfl: 2   Omega-3 Fatty Acids (FISH OIL PO), Take 2 capsules by mouth daily., Disp: , Rfl:    semaglutide -weight management (WEGOVY ) 0.25 MG/0.5ML SOAJ SQ injection, Inject 0.25 mg into the skin once a week., Disp: 2 mL, Rfl: 1   Allergies  Allergen Reactions   Penicillins Nausea And Vomiting and Rash      The patient states she uses {contraceptive methods:5051} for birth control. No LMP recorded. (Menstrual status: Irregular Periods).. {Dysmenorrhea-menorrhagia:21918}. Negative for: breast discharge, breast lump(s), breast pain and breast self exam. Associated symptoms include abnormal vaginal bleeding. Pertinent  negatives include abnormal bleeding (hematology), anxiety, decreased libido, depression, difficulty falling sleep, dyspareunia, history of infertility, nocturia, sexual dysfunction, sleep disturbances, urinary incontinence, urinary urgency, vaginal discharge and vaginal itching. Diet regular.The patient states her exercise level is    . The patient's tobacco use is:  Social History   Tobacco Use  Smoking Status Former   Current packs/day: 0.50   Average packs/day: 0.5 packs/day for 17.0 years (8.5 ttl pk-yrs)   Types: Cigarettes   Passive exposure: Never  Smokeless Tobacco Never  . She has been exposed to passive smoke. The patient's alcohol use is:  Social History    Substance and Sexual Activity  Alcohol Use Yes   Comment: occasionally  . Additional information: Last pap ***, next one scheduled for ***.    Review of Systems  Constitutional: Negative.   HENT: Negative.    Eyes: Negative.   Respiratory: Negative.    Cardiovascular: Negative.   Gastrointestinal: Negative.   Endocrine: Negative.   Genitourinary: Negative.   Musculoskeletal: Negative.   Skin: Negative.   Allergic/Immunologic: Negative.   Neurological: Negative.   Hematological: Negative.   Psychiatric/Behavioral: Negative.       There were no vitals filed for this visit. There is no height or weight on file to calculate BMI.  Wt Readings from Last 3 Encounters:  09/13/24 226 lb (102.5 kg)  08/11/24 231 lb (104.8 kg)  07/26/24 231 lb (104.8 kg)     Objective:  Physical Exam      Assessment And Plan:     Encounter for general adult medical examination w/o abnormal findings  Primary hypertension  MDD (major depressive disorder), recurrent episode, moderate (HCC)  Alcohol use     No follow-ups on file. Patient was given opportunity to ask questions. Patient verbalized understanding of the plan and was able to repeat key elements of the plan. All questions were answered to their satisfaction.   Bruna Creighton, NP  I, Bruna Creighton, NP, have reviewed all documentation for this visit. The documentation on 10/25/24 for the exam, diagnosis, procedures, and orders are all accurate and complete.

## 2024-10-25 NOTE — Patient Instructions (Signed)

## 2024-10-26 LAB — LIPID PANEL
Chol/HDL Ratio: 5.3 ratio — ABNORMAL HIGH (ref 0.0–4.4)
Cholesterol, Total: 179 mg/dL (ref 100–199)
HDL: 34 mg/dL — ABNORMAL LOW (ref >39)
LDL Chol Calc (NIH): 123 mg/dL — ABNORMAL HIGH (ref 0–99)
Triglycerides: 118 mg/dL (ref 0–149)
VLDL Cholesterol Cal: 22 mg/dL (ref 5–40)

## 2024-10-26 LAB — CBC
Hematocrit: 48.8 % — ABNORMAL HIGH (ref 34.0–46.6)
Hemoglobin: 16.4 g/dL — ABNORMAL HIGH (ref 11.1–15.9)
MCH: 32 pg (ref 26.6–33.0)
MCHC: 33.6 g/dL (ref 31.5–35.7)
MCV: 95 fL (ref 79–97)
Platelets: 393 x10E3/uL (ref 150–450)
RBC: 5.13 x10E6/uL (ref 3.77–5.28)
RDW: 12.7 % (ref 11.7–15.4)
WBC: 10.4 x10E3/uL (ref 3.4–10.8)

## 2024-10-26 LAB — CMP14+EGFR
ALT: 60 IU/L — ABNORMAL HIGH (ref 0–32)
AST: 48 IU/L — ABNORMAL HIGH (ref 0–40)
Albumin: 4 g/dL (ref 3.9–4.9)
Alkaline Phosphatase: 107 IU/L (ref 41–116)
BUN/Creatinine Ratio: 9 (ref 9–23)
BUN: 8 mg/dL (ref 6–24)
Bilirubin Total: 0.7 mg/dL (ref 0.0–1.2)
CO2: 21 mmol/L (ref 20–29)
Calcium: 9.9 mg/dL (ref 8.7–10.2)
Chloride: 104 mmol/L (ref 96–106)
Creatinine, Ser: 0.87 mg/dL (ref 0.57–1.00)
Globulin, Total: 2.9 g/dL (ref 1.5–4.5)
Glucose: 94 mg/dL (ref 70–99)
Potassium: 4.7 mmol/L (ref 3.5–5.2)
Sodium: 138 mmol/L (ref 134–144)
Total Protein: 6.9 g/dL (ref 6.0–8.5)
eGFR: 84 mL/min/1.73 (ref >59)

## 2024-10-26 LAB — VITAMIN D 25 HYDROXY (VIT D DEFICIENCY, FRACTURES): Vit D, 25-Hydroxy: 5.1 ng/mL — ABNORMAL LOW (ref 30.0–100.0)

## 2024-10-26 LAB — HEPATITIS B SURFACE ANTIBODY,QUALITATIVE: Hep B Surface Ab, Qual: NONREACTIVE

## 2024-11-07 ENCOUNTER — Ambulatory Visit: Payer: Self-pay | Admitting: Family Medicine

## 2024-11-07 DIAGNOSIS — E559 Vitamin D deficiency, unspecified: Secondary | ICD-10-CM | POA: Insufficient documentation

## 2024-11-07 DIAGNOSIS — Z1231 Encounter for screening mammogram for malignant neoplasm of breast: Secondary | ICD-10-CM | POA: Insufficient documentation

## 2024-11-07 DIAGNOSIS — F331 Major depressive disorder, recurrent, moderate: Secondary | ICD-10-CM | POA: Insufficient documentation

## 2024-11-07 DIAGNOSIS — Z1159 Encounter for screening for other viral diseases: Secondary | ICD-10-CM | POA: Insufficient documentation

## 2024-11-07 DIAGNOSIS — F17200 Nicotine dependence, unspecified, uncomplicated: Secondary | ICD-10-CM | POA: Insufficient documentation

## 2024-11-07 DIAGNOSIS — Z1211 Encounter for screening for malignant neoplasm of colon: Secondary | ICD-10-CM | POA: Insufficient documentation

## 2024-11-07 DIAGNOSIS — Z0000 Encounter for general adult medical examination without abnormal findings: Secondary | ICD-10-CM | POA: Insufficient documentation

## 2024-11-07 MED ORDER — VITAMIN D (ERGOCALCIFEROL) 1.25 MG (50000 UNIT) PO CAPS: 50000.0000 [IU] | ORAL_CAPSULE | ORAL | 4 refills | Status: AC

## 2024-11-07 NOTE — Progress Notes (Signed)
 Liver enzymes that were elevated are gradually reducing, this is due to reduced alcohol consumption so keep it up. Your cholesterol levels are slightly elevated, so low fat diet and exercised is advised.  Your vitamin D  level is low, 50,000 units of vitamin D  has been sent to the pharmacy. Take one caplet weekly .    Thanks!

## 2024-11-07 NOTE — Assessment & Plan Note (Signed)
 Experiencing anxiety and depression, managed with escitalopram  and gabapentin .. - Continue escitalopram  10 mg orally daily. - Use gabapentin  as needed for anxiety, up to three times a day. - Advised taking gabapentin  at night to assess drowsiness. - Rescheduled therapy appointment.

## 2024-11-07 NOTE — Assessment & Plan Note (Signed)
 Actively reducing alcohol consumption with family support and medication. Reports decreased cravings and motivation to remain alcohol-free. - Continue current management plan for alcohol use disorder with naltrexone  50 mg every day  - Encouraged continued abstinence and support from family.

## 2024-11-07 NOTE — Assessment & Plan Note (Signed)
 She is encouraged to strive for BMI less than 30 to decrease cardiac risk. Advised to aim for at least 150 minutes of exercise per week.

## 2024-11-07 NOTE — Assessment & Plan Note (Signed)
 Smoking cessation advised.

## 2024-11-07 NOTE — Assessment & Plan Note (Signed)
-  Blood pressure well-controlled at 116/80 mmHg.  -Adherent to antihypertensive regimen, Norvasc  5 mg every day - Continue current antihypertensive medication regimen.

## 2024-11-12 ENCOUNTER — Emergency Department (HOSPITAL_BASED_OUTPATIENT_CLINIC_OR_DEPARTMENT_OTHER)
Admission: EM | Admit: 2024-11-12 | Discharge: 2024-11-12 | Disposition: A | Discharge status: Home / Self Care | Attending: Emergency Medicine | Admitting: Emergency Medicine

## 2024-11-12 ENCOUNTER — Encounter (HOSPITAL_BASED_OUTPATIENT_CLINIC_OR_DEPARTMENT_OTHER): Payer: Self-pay

## 2024-11-12 ENCOUNTER — Emergency Department (HOSPITAL_BASED_OUTPATIENT_CLINIC_OR_DEPARTMENT_OTHER): Admitting: Radiology

## 2024-11-12 DIAGNOSIS — G8929 Other chronic pain: Secondary | ICD-10-CM | POA: Insufficient documentation

## 2024-11-12 DIAGNOSIS — M25512 Pain in left shoulder: Secondary | ICD-10-CM | POA: Diagnosis not present

## 2024-11-12 DIAGNOSIS — M25552 Pain in left hip: Secondary | ICD-10-CM | POA: Insufficient documentation

## 2024-11-12 DIAGNOSIS — I878 Other specified disorders of veins: Secondary | ICD-10-CM | POA: Diagnosis not present

## 2024-11-12 LAB — PREGNANCY, URINE: Preg Test, Ur: NEGATIVE

## 2024-11-12 MED ORDER — IBUPROFEN 400 MG PO TABS
600.0000 mg | ORAL_TABLET | Freq: Once | ORAL | Status: AC
  Administered 2024-11-12: 600 mg via ORAL
  Filled 2024-11-12: qty 1

## 2024-11-12 MED ORDER — LIDOCAINE 5 % EX PTCH: 1.0000 | MEDICATED_PATCH | CUTANEOUS | 0 refills | Status: AC

## 2024-11-12 MED ORDER — LIDOCAINE 5 % EX PTCH
1.0000 | MEDICATED_PATCH | CUTANEOUS | Status: DC
  Administered 2024-11-12: 1 via TRANSDERMAL
  Filled 2024-11-12: qty 1

## 2024-11-12 NOTE — ED Notes (Signed)
 DC paperwork given and verbally understood.
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
## 2024-11-12 NOTE — Discharge Instructions (Signed)
 The x-ray of your hip and left shoulder did not show any fractures or dislocations. The pain in these areas seems to be due to muscle pain.  Please engage in light physical activity (like walking) to prevent your pain from worsening and to prevent stiffness. Refrain from bedrest which can make your pain worse.   You may use up to 600mg  ibuprofen  every 6 hours as needed for pain.  Do not exceed 2.4g of ibuprofen  per day.    You may take up to 1000mg  of tylenol  every 6 hours as needed for pain.  Do not take more then 4g per day.    You may use a heating pack on your back to help with the pain.  You have been prescribed lidocaine  patches to help with pain. You may apply 1-2 patches to the area of pain for up to 12 hours at a time. Then, you must remove the patch for a full 12 hours before re-applying a new patch.    Please follow up with your PCP in the next 2 to 3 weeks for recheck of symptoms.  They can also refer you to physical therapy if needed.   Return to ER for any numbness in your arms or legs, severe worsening of pain, any redness of the skin, any other new or concerning symptoms

## 2024-11-12 NOTE — ED Triage Notes (Signed)
 She states she tripped (didn't [place] out) at home ~ 2 weeks ago. She c/o pain and allegedly crepitus of left hip. She is ambulatory.

## 2024-11-17 ENCOUNTER — Other Ambulatory Visit: Payer: Self-pay | Admitting: Family Medicine

## 2024-11-22 ENCOUNTER — Ambulatory Visit: Payer: Self-pay | Admitting: Family Medicine

## 2024-11-23 ENCOUNTER — Ambulatory Visit: Payer: Self-pay | Admitting: Licensed Clinical Social Worker

## 2024-11-23 DIAGNOSIS — F411 Generalized anxiety disorder: Secondary | ICD-10-CM

## 2024-11-23 DIAGNOSIS — F331 Major depressive disorder, recurrent, moderate: Secondary | ICD-10-CM

## 2024-11-23 NOTE — BH Specialist Note (Signed)
 Virtual Visit via Video Note  I connected with Rebecca Flynn on 11/23/2024 at  9:00 AM EST by a video enabled telemedicine application and verified that I am speaking with the correct person using two identifiers.  Location: Patient: Primary residence, [place] Provider: Clinician virtual office, [place]   I discussed the limitations of evaluation and management by telemedicine and the availability of in person appointments. The patient expressed understanding and agreed to proceed.   I discussed the assessment and treatment plan with the patient. The patient was provided an opportunity to ask questions and all were answered. The patient agreed with the plan and demonstrated an understanding of the instructions.     Integrated Behavioral Health Follow Up In-Person Visit  MRN: [id number] Name: Rebecca Flynn  Number of Integrated Behavioral Health Clinician visits: 3- Third Visit  Session Start time: 0900   Session End time: 0935  Total time in minutes: 35  Encounter Diagnoses  Name Primary?   MDD (major depressive disorder), recurrent episode, moderate (HCC) Yes   Generalized anxiety disorder      Types of Service: Collaborative care  Subjective: Rebecca Flynn is a 44 y.o. female with history of depression and anxiety seen in consultation at the request of Bruna Creighton NP for establishment of [facility] Collaborative Care management.   Pt is currently taking the following psychiatric medications: escitalopram  10 mg started 09/13/24.  Current symptoms include: little interest/pleasure in activities, feeling down/depressed/hopeless, primary insomnia due to racing thoughts/worries, feeling tired/low energy, trouble focusing/concentrating, excessive worrying, feeling nervous/anxious/on edge, restlessness, irritability, shakiness (voice and hands).  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    Patient reports the following symptoms/concerns: worsening symptoms of depression since the unexpected loss of her brother the day after Thanksgiving.  Patient reports that she has maintained her overall sobriety for 90 days, but does think about drinking every day.  Patient reports that she is compliant with her medication, and is wondering if an increased dose would be helpful (lexapro ).  Patient reports that she has a friend who recently got a DUI, so this was an eye-opener for her about drinking and real-world consequences of drinking.  Duration of problem: Ongoing; Severity of problem: moderate-severe   Objective: Mood: Depressed and Affect: Depressed and Tearful Risk of harm to self or others: No plan to harm self or others  Life Context: Family and Social: Patient reports ongoing depression/grief associated with unexpected loss of her brother the day after Thanksgiving.  Patient reports that she had an opportunity to see family members at Christmas, and is also worried about her father's overall health.  Patient reports that he has multiple lesions in his brain and she is worried about his ongoing treatment/outcome.  School/Work: Patient requesting FMLA intermittent leave--2 to 3 days/month for 2 months  Self-Care: Patient reports that she has been compliant with her medication and is maintaining sobriety.  Patient discussed potential plans to go back to school to be a pharmacy tech  Life Changes: Recent loss of brother, ongoing conflict with mother/sister  Patient and/or Family's Strengths/Protective Factors: Concrete supports in place (healthy food, safe environments, etc.), Sense of purpose, and Physical Health (exercise, healthy diet, medication compliance, etc.)  Goals Addressed: Patient will:  Reduce symptoms of: anxiety and depression   Increase  knowledge and/or ability of: coping skills, healthy  habits, self-management skills, and stress reduction   Demonstrate ability to: Increase healthy adjustment to current life circumstances and Decrease self-medicating behaviors  Progress towards Goals: Ongoing  Interventions: Interventions utilized:  Motivational Interviewing, Behavioral Activation, Medication Monitoring, and Supportive Counseling Standardized Assessments completed: C-SSRS Short, GAD-7, and PHQ 9     11/23/2024    4:36 PM 10/25/2024   11:08 AM 10/05/2024    9:21 AM  Depression screen PHQ 2/9  Decreased Interest 2 1 2   Down, Depressed, Hopeless 3 2 2   PHQ - 2 Score 5 3 4   Altered sleeping 3 2 3   Tired, decreased energy 3 1 1   Change in appetite 1 1 1   Feeling bad or failure about yourself  2 2 2   Trouble concentrating 1 1 1   Moving slowly or fidgety/restless 2 2 1   Suicidal thoughts 0 0 1  PHQ-9 Score 17 12 14   Difficult doing work/chores Very difficult Very difficult        11/23/2024    4:48 PM 10/25/2024   11:08 AM 10/05/2024    9:27 AM 09/13/2024    9:22 AM  GAD 7 : Generalized Anxiety Score  Nervous, Anxious, on Edge 3 2 2  0  Control/stop worrying 3 2 3  0  Worry too much - different things 3 3 3  0  Trouble relaxing 2 2 3  0  Restless 2 1 1  0  Easily annoyed or irritable 3 2 1  0  Afraid - awful might happen 3 3 1  0  Total GAD 7 Score 19 15 14  0  Anxiety Difficulty Very difficult Very difficult  Not difficult at all    . Flowsheet Row Integrated Behavioral Health from 11/23/2024 in [facility] ED from 11/12/2024 in [facility] Department at [facility] from 07/28/2024 in [facility] at [facility])  C-SSRS RISK CATEGORY No Risk No Risk No Risk      Patient and/or Family Response: Patient reports that she feels down, depressed, hopeless, very anxious and very distressed.  Patient reports that she leans on her faith and prayers to get her through.  Patient reports that  the first lady at her church is a good support system for her.  Patient Centered Plan: Patient is on the following Treatment Plan(s):  1. Continue Lexapro  10 mg daily 2. Recommend gabapentin  100 mg TID PRN anxiety  3. Naltrexone  50 mg daily and recommend 12-step programs or Smart Recovery 4. BH specialist to follow up.  Clinical Assessment/Diagnosis Encounter Diagnoses  Name Primary?   MDD (major depressive disorder), recurrent episode, moderate (HCC) Yes   Generalized anxiety disorder      Assessment: Patient currently experiencing an increase in overall depression, anxiety, and insomnia triggered by recent external stressors.  Patient reports that she is compliant with her medication and is also sober.   Patient may benefit from ongoing IBH support, recommend psychiatric review all of case to see if medication dosage change indicated.  Plan: Follow up with behavioral health clinician on : 12/07/2024, 12/21/2024 Behavioral recommendations:  IBH collaborative care treatment team plan revision (psychiatric consultation) Continue compliance with medications prescribed Maintain current levels of sobriety Reach out to spiritual/church leaders to recommend meditation material  Referral(s): Integrated [employer] (In Clinic)  [place] R Yamin Swingler, LCSW

## 2024-11-23 NOTE — Patient Instructions (Signed)
 Using Behavioral Activation to manage stress/depression symptoms     Identify/understand your own mood triggers.   Structure your day--get up around the same time, eat meals/snacks around the same time, go to bed around the same time.   Purposefully schedule self care time and time to complete tasks. This can include quiet time.  Stimulate your brain--go for a walk, text/call a friend or family member, if you are indoors--go outside (and vice versa), go for a drive, go to a store with bright colors and bright lights. Try to do things in a different way--drive to your favorite places using an alternative route, or instead of starting on the right side of the grocery store when shopping, start on the left side. You might feel a bit uncomfortable doing things outside of the comfort zone, but this is helping the brain create new neural pathways and is very healthy for brain/emotional health.   Physical movement based on your ability. If you can go for a walk, do stretches, even waving your hands to music can trigger feel-good endorphins in the brain and help release physical tension we all hold in our bodies.  Even 5 minutes can make a difference.   Be intentional about doing things that bring you joy (or used to bring you joy), and look for the things in every day that make you happy.  Seek those glimmers of joy each day.  Set a timer for 5 minutes for a harder task (ex. Laundry, washing dishes).  Allow yourself to work distraction-free for 5 minutes, then stop when the timer goes off. If you need a break, take a break. If you want to continue working then set another timer for whatever time you choose.   Limit or eliminate substance use including alcohol, marijuana, or recreational use of prescription medication.  Let in the light!! Open the window blinds, curtains and let natural light in. Even sitting near a window or sitting outside can boost your mood, especially in the wintertime when  there is less daylight.   If you take medication to manage symptoms, remember to take all medications as they are prescribed (please read all labels!!)    Things to envision for ourselves to to improve inspiration, motivation, and initiative :  improving physical wellness, focus on family relationships, focusing on our own mental/emotional well being, being a part of a bigger community, finding a hobby, being a part of something that fosters personal growth, engaging socially with others (even digitally!!!)    ANXIETY/PANIC EPISODE MANAGEMENT (CBT/MINDFULNESS BASED)   If you are in a highly stimulating or triggering environment, change your location to a less stimulating or safer environment .   Stimulate your senses by tasting/eating a sour candy (such as a lemon drop) or a strong flavored cough drop.  This triggers smell, taste, and touch since we  have had lots of nerve endings inside of your mouth.   Practice slow, controlled breathing to avoid hyperventilation.  Breathing into your nose for the count of 4 inside your head, holding your breath for the count of 4 inside your head, and exhale slowly counting to 8 inside your head.  This is called 4-4-8 breathing, or triangle breathing. (Controlled breathing). This helps manage panic, anxiety, anger, and tearfulness.  Additional grounding exercises include rubbing your hands softly together, or wiggling toes inside your shoes, pretending to grasp the floor with your toes.    Listen to calming music or sounds  Count backwards in your head by  twos or tens or recite multiplication tables in your head.  Visualize a calming, happy place and identify 5 explicit details about this place (color, temperature, smells, visual details, etc.)  Splash cold water on your face or hold an ice cube in your hand (alternate hands)  Clench and release muscle groups (hands, shoulders, facial muscles)  Engage in soothing activities to recover after a  stressful/anxious episode such as: drinking a warm beverage, sitting in your favorite comfortable location, positive physical contact with a pet or weighted blanket, using positive self talk/positive affirmations.   Download PTSD Coach app for your phone or tablet--its free and has lots of tips to help you manage panic episodes no matter where you are!    Emergency Resources:  National Suicide & Crisis Lifeline: Call or text 988  Crisis Text Line: Text HOME to 332-080-6994  [facility]  [address] [place], [place] [address] [phone] or [phone] [facility] 24/7 FOR ANYONE [address], [place], [place]  [phone] Fax: [phone] [email or web address] *Interpreters available *Accepts all insurance and uninsured for Urgent Care needs *Accepts Medicaid and uninsured for outpatient treatment (below)

## 2024-11-29 ENCOUNTER — Other Ambulatory Visit: Payer: Self-pay | Admitting: Family Medicine

## 2024-11-30 ENCOUNTER — Other Ambulatory Visit: Payer: Self-pay | Admitting: Family Medicine

## 2024-12-01 ENCOUNTER — Telehealth: Payer: Self-pay | Admitting: Licensed Clinical Social Worker

## 2024-12-01 DIAGNOSIS — F331 Major depressive disorder, recurrent, moderate: Secondary | ICD-10-CM
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
## 2024-12-07 ENCOUNTER — Telehealth (HOSPITAL_COMMUNITY): Payer: Self-pay | Admitting: Psychiatry

## 2024-12-07 ENCOUNTER — Encounter: Payer: Self-pay | Admitting: Family Medicine

## 2024-12-07 ENCOUNTER — Other Ambulatory Visit: Payer: Self-pay | Admitting: Family Medicine

## 2024-12-07 ENCOUNTER — Ambulatory Visit: Payer: Self-pay | Admitting: Licensed Clinical Social Worker

## 2024-12-07 DIAGNOSIS — F411 Generalized anxiety disorder: Secondary | ICD-10-CM

## 2024-12-07 DIAGNOSIS — F332 Major depressive disorder, recurrent severe without psychotic features: Secondary | ICD-10-CM

## 2024-12-07 MED ORDER — ESCITALOPRAM OXALATE 10 MG PO TABS: 15.0000 mg | ORAL_TABLET | Freq: Every day | ORAL | 1 refills | Status: AC

## 2024-12-07 NOTE — Telephone Encounter (Signed)
 D:  Pt's therapist Christina Hussami, LCSW referred pt to virtual MH-IOP.  A:  Placed call to pt, but there was no answer and no voicemail box came on.  Will attempt to call pt again later today.  Inform Christina.

## 2024-12-07 NOTE — BH Specialist Note (Signed)
 Virtual Visit via Video Note  I connected with Rebecca Flynn on 12/07/24 at  9:30 AM EST by a video enabled telemedicine application and verified that I am speaking with the correct person using two identifiers.  Location: Patient: Primary residence, [place] Provider: [facility] ([facility]), [place]   I discussed the limitations of evaluation and management by telemedicine and the availability of in person appointments. The patient expressed understanding and agreed to proceed.   I discussed the assessment and treatment plan with the patient. The patient was provided an opportunity to ask questions and all were answered. The patient agreed with the plan and demonstrated an understanding of the instructions.     Integrated Behavioral Health Follow Up Visit  MRN: [id number] Name: Rebecca Flynn  Number of Integrated Behavioral Health Clinician visits: 5-Fifth Visit  Session Start time: 0930   Session End time: 1015  Total time in minutes: 45  Encounter Diagnoses  Name Primary?   Severe episode of recurrent major depressive disorder, without psychotic features (HCC) Yes   Generalized anxiety disorder      Types of Service: Collaborative care  Subjective: Rebecca Flynn is a 44 y.o. female with history of depression and anxiety seen in consultation at the request of Bruna Creighton NP for establishment of [facility] Collaborative Care management.   Lexapro  10. Pt was recommended to increase dose of Lexapro  from 10-15 by [facility] psych provider on 12/01/24. Dosage change pending approval by PCP.    Patient reports the following symptoms/concerns: Current symptoms include: little interest/pleasure in activities, feeling down/depressed/hopeless, primary insomnia due to racing thoughts/worries, feeling tired/low energy, trouble focusing/concentrating, excessive worrying, feeling nervous/anxious/on edge, restlessness, irritability, shakiness (voice and hands).  Pt denies SI, HI, or AVH at time of  session although she does express wishing she were with my sister (sister died in 03/28/24). Pt experienced sudden loss of her brother 11/25. 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  Patient reports that she often will set limits and set boundaries with friends and family members, which is a good thing. Pt reports that her best friend (pastor's wife) is her best friend and primary support person.  School/Work: Patient reports that she is struggling to perform work tasks.  Patient reports that she is having trouble focusing, concentrating, and is feeling more frustrated at times, which carries over into her work quality.  Patient states that she is not happy about this because she takes pride in her overall work.  Pt requested FMLA intermittent leave 2-3 days per month (in past), but feels that this is not enough. Encouraged pt that if she is needing more time off work and cannot function at work to engage in IOP groups. IBH clinician submitted IOP referral 12/07/24.   Self-Care: Patient reports that she has been compliant with her medication and is maintaining sobriety.  Patient reports that she knows that she needs to prioritize self-care, but does not have the energy to focus on it at time of session.  Patient reports that her goal right now is to just  get through the day patient reports that she leans  on her faith on a daily basis.   Life Changes: Pt reports that she is trying to get through by faking it at work. Pt reports that she is trying very hard to show up every day for her employers and her clients.  Patient still  struggling with the loss of her sister and brother both in 2025. (Sister in May and brother in November).   Patient and/or Family's Strengths/Protective Factors: Concrete supports in place (healthy food, safe environments, etc.), Sense of purpose, and Physical Health (exercise, healthy diet, medication compliance, etc.)  Goals Addressed: Patient will:  Reduce symptoms of: anxiety and depression   Increase knowledge and/or ability of: coping skills, healthy habits, self-management skills, and stress reduction   Demonstrate ability to: Increase healthy adjustment to current life circumstances and Decrease self-medicating behaviors  Progress towards Goals: Ongoing  Interventions: Interventions utilized:  Motivational Interviewing, Mining Engineer, Medication Monitoring, Supportive Counseling, and Link to [employer] Standardized Assessments completed: C-SSRS Short, GAD-7, and PHQ 9     12/07/2024    9:50 AM 11/23/2024    4:36 PM 10/25/2024   11:08 AM  Depression screen PHQ 2/9  Decreased Interest 3 2 1   Down, Depressed, Hopeless 3 3 2   PHQ - 2 Score 6 5 3   Altered sleeping 2 3 2   Tired, decreased energy 3 3 1   Change in appetite 3 1 1   Feeling bad or failure about yourself  3 2 2   Trouble concentrating 1 1 1   Moving slowly or fidgety/restless 2 2 2   Suicidal thoughts 0 0 0  PHQ-9 Score 20 17 12   Difficult doing work/chores  Very difficult Very difficult       12/07/2024    9:56 AM 11/23/2024    4:48 PM 10/25/2024   11:08 AM 10/05/2024    9:27 AM  GAD 7 : Generalized Anxiety Score  Nervous, Anxious, on Edge 3 3  2  2    Control/stop worrying 3 3  2  3    Worry too much - different things 3 3  3  3    Trouble relaxing 2 2  2  3    Restless 2 2  1  1    Easily annoyed or irritable 3 3  2  1    Afraid - awful might happen 3 3  3  1    Total GAD 7 Score 19 19 15 14   Anxiety Difficulty Very difficult Very difficult Very difficult      Data saved with a previous  flowsheet row definition    . Flowsheet Row Integrated Behavioral Health from 12/07/2024 in [facility] Integrated Behavioral Health from 11/23/2024 in [facility] ED from 11/12/2024 in [facility]  C-SSRS RISK CATEGORY No Risk No Risk No Risk    Patient and/or Family Response: Patient reports that she continues to feel sad, and depressed most days of the week.  Patient reports her anxiety levels are high, and that her irritability has increased.  Patient reports that she is worried about her father, whom she is primary caregiver. Patient reports that he recently had a brain scan and has lesions on his brain.  Patient Centered Plan: (IBH collaborative care team recommendations)  1. Recommend increasing Lexapro  10 mg daily to 15 mg daily. 2. Continue gabapentin  100 mg every 8 hours and vitamin D  supplement 3. Referral IOP groups (intensive outpatient program)  Clinical Assessment/Diagnosis Encounter Diagnoses  Name Primary?   Severe  episode of recurrent major depressive disorder, without psychotic features (HCC) Yes   Generalized anxiety disorder      Assessment: IBH clinician reviewed Rebecca Flynn 's PHQ-9, GAD-7, and CSSRS scores. Scores reflect ongoing/increasing symptoms related to depression and anxiety. IBH clinician explored current external stressors that pt is experiencing (losses, worries about father's health, work stress) and reviewed coping skills that patient is utilizing and provided additional recommendations of alternative coping skills to support symptom management. IBH clinician provided pt with psychoeducational materials in after visit summary, and instructed pt to review materials through their confidential MyChart portal. IBH clinician encouraging continued [facility] Collaborative Care Team supports including counseling and medication management.   Plan: Submit  referral to IOP (intensive outpatient program) Behavioral recommendations:   Medication compliance Follow up with IOP referral Maintain current levels of sobriety Continue behavioral activation/mindfulness  Referral(s): (IOP)Intensive outpatient program.  Patient may need future referral to psychiatrist for medication management and outpatient therapy.  IBH collaborative care team is here to support throughout that process  Nastasha Reising R Christean Silvestri, LCSW

## 2024-12-07 NOTE — Patient Instructions (Signed)
 Using Behavioral Activation to manage stress/depression symptoms     Identify/understand your own mood triggers.   Structure your day--get up around the same time, eat meals/snacks around the same time, go to bed around the same time.   Purposefully schedule self care time and time to complete tasks. This can include quiet time.  Stimulate your brain--go for a walk, text/call a friend or family member, if you are indoors--go outside (and vice versa), go for a drive, go to a store with bright colors and bright lights. Try to do things in a different way--drive to your favorite places using an alternative route, or instead of starting on the right side of the grocery store when shopping, start on the left side. You might feel a bit uncomfortable doing things outside of the comfort zone, but this is helping the brain create new neural pathways and is very healthy for brain/emotional health.   Physical movement based on your ability. If you can go for a walk, do stretches, even waving your hands to music can trigger feel-good endorphins in the brain and help release physical tension we all hold in our bodies.  Even 5 minutes can make a difference.   Be intentional about doing things that bring you joy (or used to bring you joy), and look for the things in every day that make you happy.  Seek those glimmers of joy each day.  Set a timer for 5 minutes for a harder task (ex. Laundry, washing dishes).  Allow yourself to work distraction-free for 5 minutes, then stop when the timer goes off. If you need a break, take a break. If you want to continue working then set another timer for whatever time you choose.   Limit or eliminate substance use including alcohol, marijuana, or recreational use of prescription medication.  Let in the light!! Open the window blinds, curtains and let natural light in. Even sitting near a window or sitting outside can boost your mood, especially in the wintertime when  there is less daylight.   If you take medication to manage symptoms, remember to take all medications as they are prescribed (please read all labels!!)    Things to envision for ourselves to to improve inspiration, motivation, and initiative :  improving physical wellness, focus on family relationships, focusing on our own mental/emotional well being, being a part of a bigger community, finding a hobby, being a part of something that fosters personal growth, engaging socially with others (even digitally!!!)      ANXIETY/PANIC EPISODE MANAGEMENT (CBT/MINDFULNESS BASED)   If you are in a highly stimulating or triggering environment, change your location to a less stimulating or safer environment .   Stimulate your senses by tasting/eating a sour candy (such as a lemon drop) or a strong flavored cough drop.  This triggers smell, taste, and touch since we  have had lots of nerve endings inside of your mouth.   Practice slow, controlled breathing to avoid hyperventilation.  Breathing into your nose for the count of 4 inside your head, holding your breath for the count of 4 inside your head, and exhale slowly counting to 8 inside your head.  This is called 4-4-8 breathing, or triangle breathing. (Controlled breathing). This helps manage panic, anxiety, anger, and tearfulness.  Additional grounding exercises include rubbing your hands softly together, or wiggling toes inside your shoes, pretending to grasp the floor with your toes.    Listen to calming music or sounds  Count backwards in your  head by twos or tens or recite multiplication tables in your head.  Visualize a calming, happy place and identify 5 explicit details about this place (color, temperature, smells, visual details, etc.)  Splash cold water on your face or hold an ice cube in your hand (alternate hands)  Clench and release muscle groups (hands, shoulders, facial muscles)  Engage in soothing activities to recover after  a stressful/anxious episode such as: drinking a warm beverage, sitting in your favorite comfortable location, positive physical contact with a pet or weighted blanket, using positive self talk/positive affirmations.   Download PTSD Coach app for your phone or tablet--its free and has lots of tips to help you manage panic episodes no matter where you are     ?? Self-Care Worksheet  Always prioritize medication compliance--if you are prescribed medications, please take medication as prescribed.   ?? Mental & Emotional Self-Care Practice mindfulness or meditation for 5-10 minutes daily Journal your thoughts or feelings Set boundaries to protect your emotional energy Talk to a therapist or counselor Engage in positive self-talk  ?? Physical Self-Care Get 7-9 hours of sleep each night Move your body (walk, stretch, dance, exercise) Eat nourishing meals and stay hydrated Take breaks to rest and recharge Schedule regular health check-ups  ?? Creative Self-Care Try a new hobby or craft Listen to or play music Draw, paint, or color Write poetry or stories [place] or bake something new  ?? Social Self-Care Spend time with supportive friends or family Join a group or community with shared interests Say no when you need to Reach out to someone you trust Plan a fun outing or virtual hangout  ?? Spiritual Self-Care Reflect on your values and beliefs Spend time in nature Read spiritual or inspirational texts Practice gratitude daily Attend a place of worship or spiritual gathering  ?? Environmental Self-Care Adult Nurse a calming corner or sanctuary Use scents, lighting, or music to set a mood Organize your schedule or workspace Reduce digital clutter

## 2024-12-08 ENCOUNTER — Telehealth (HOSPITAL_COMMUNITY): Payer: Self-pay | Admitting: Psychiatry

## 2024-12-08 NOTE — Telephone Encounter (Signed)
 D:  Placed second call to pt.  A:  Oriented pt.  Scheduled CCA for 12-11-24 @ 9:30 a.m; start virtual MH-IOP on 12-12-24.  Inform Christina Hussami, LCSW.  R:  Pt receptive.

## 2024-12-11 ENCOUNTER — Telehealth (HOSPITAL_COMMUNITY): Payer: Self-pay | Admitting: Psychiatry

## 2024-12-11 ENCOUNTER — Other Ambulatory Visit (HOSPITAL_COMMUNITY): Payer: Self-pay | Attending: Psychiatry | Admitting: Psychiatry

## 2024-12-11 ENCOUNTER — Encounter (HOSPITAL_COMMUNITY): Payer: Self-pay | Admitting: Psychiatry

## 2024-12-11 DIAGNOSIS — Z1330 Encounter for screening examination for mental health and behavioral disorders, unspecified: Secondary | ICD-10-CM

## 2024-12-11 NOTE — Telephone Encounter (Signed)
 D:  Pt was scheduled for a virtual CCA this morning at 9:30 but patient didn't show.  A:  Placed call to patient, after waiting online for 15 minutes, but there was no answer and her voicemail box hasn't been set up.  Will attempt to reach back out to patient today.  Inform Christina Hussami, LCSW.
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
## 2024-12-11 NOTE — Telephone Encounter (Signed)
 D:  Placed another call to attempt to complete the CCA for MH-IOP but pt still isn't answering.  A:  Inform Christina Hussami, LCSW.  Will try patient again later today.

## 2024-12-12 ENCOUNTER — Other Ambulatory Visit (HOSPITAL_COMMUNITY): Payer: Self-pay | Attending: Psychiatry | Admitting: Psychiatry

## 2024-12-12 ENCOUNTER — Encounter (HOSPITAL_COMMUNITY): Payer: Self-pay | Admitting: Psychiatry

## 2024-12-12 ENCOUNTER — Encounter (HOSPITAL_COMMUNITY): Payer: Self-pay

## 2024-12-12 DIAGNOSIS — F332 Major depressive disorder, recurrent severe without psychotic features: Secondary | ICD-10-CM | POA: Diagnosis present

## 2024-12-12 DIAGNOSIS — F419 Anxiety disorder, unspecified: Secondary | ICD-10-CM | POA: Insufficient documentation

## 2024-12-12 DIAGNOSIS — Z634 Disappearance and death of family member: Secondary | ICD-10-CM | POA: Insufficient documentation

## 2024-12-12 DIAGNOSIS — Z79899 Other long term (current) drug therapy: Secondary | ICD-10-CM | POA: Diagnosis not present

## 2024-12-12 DIAGNOSIS — F1091 Alcohol use, unspecified, in remission: Secondary | ICD-10-CM | POA: Diagnosis not present

## 2024-12-12 NOTE — Progress Notes (Signed)
 Virtual Visit via Video Note  I connected with Rebecca Flynn on 12/12/24 at  9:00 AM EST by a video enabled telemedicine application and verified that I am speaking with the correct person using two identifiers.  Location: Patient: Home Provider: Office   I discussed the limitations of evaluation and management by telemedicine and the availability of in person appointments. The patient expressed understanding and agreed to proceed.    I discussed the assessment and treatment plan with the patient. The patient was provided an opportunity to ask questions and all were answered. The patient agreed with the plan and demonstrated an understanding of the instructions.   The patient was advised to call back or seek an in-person evaluation if the symptoms worsen or if the condition fails to improve as anticipated.  I provided 15 minutes of non-face-to-face time during this encounter.   Staci LOISE Kerns, NP    Psychiatric Initial Adult Assessment   Patient Identification: ANGELLY SPEARING MRN:  [id number] Date of Evaluation:  12/12/2024 Referral Source: Tawni Brisker, LCSW Chief Complaint:   Chief Complaint  Patient presents with   Anxiety   Depression   Trauma   Visit Diagnosis:    ICD-10-CM   1. Severe episode of recurrent major depressive disorder, without psychotic features (HCC)  F33.2       History of Present Illness:  Rebecca Flynn 44 year old female presents after referral by current therapist.  Reports she has been struggling with multiple symptoms related to worsening depression.  States her symptoms began to worsen after the passing of her brother this past Thanksgiving.  States passed away from cancer the day after Thanksgiving.  States her father is currently diagnosed with cancer and she is his primary care provider.  Reports struggling with unresolved grief and anxiety related to multiple passes in a short period of time.  Reported she was utilizing alcohol to help mask her  depression symptoms.  Reports her primary care provider initiated her on naltrexone  to which she has been sober for 90 days.  She denied that she has ever been residential treatment.  Reports feeling a little bit better since she decided to seek help.  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  States she has 2 adult sons 45 and 36.  Denied previous mental health inpatient admissions.  Denied history of self injures behaviors.  Reported she was drinking a pint of alcohol daily.  Denied any other illicit substance use/abuse.  Patient to start intensive outpatient programming 12/12/2024.  Reports some improvements with her symptoms related to increased depression, inability to focus, ongoing symptoms of worry and restlessness.  Currently she is prescribed Librium , gabapentin , Lexapro  15 mg daily and naltrexone .  Which she reports she has been taking and tolerating well.  During evaluation Nahima Ales Deeg is pleasant calm mood congruent.;she is alert/oriented x 3; calm/cooperative; and mood congruent with affect.  Patient is speaking in a clear tone at moderate volume, and normal pace; with good eye contact.  Her thought process is coherent and relevant; There is no indication that she is currently responding to internal/external stimuli or experiencing delusional thought content.    Patient denies suicidal/self-harm/homicidal ideation, psychosis, and paranoia.  Patient has remained calm throughout assessment and has answered questions appropriately.   Associated Signs/Symptoms: Depression Symptoms:  depressed mood, feelings of worthlessness/guilt, difficulty concentrating, anxiety, (Hypo) Manic Symptoms:  Distractibility, Anxiety Symptoms:  Excessive Worry,  Psychotic Symptoms:  Hallucinations: None PTSD Symptoms: NA reports grief and loss friends and family  Past Psychiatric History: Major depressive,  substance-induced mood disorder alcohol misuse  Previous Psychotropic Medications: No   Substance Abuse History in the last 12 months:  Yes.    Consequences of Substance Abuse: NA  Past Medical History:  Past Medical History:  Diagnosis Date   Asthma    Bronchitis    Cellulitis of right thigh 09/15/2017   Chlamydia    DUB (dysfunctional uterine bleeding)    Herpes genitalis    Hydrosalpinx    Hypertension    PID (pelvic inflammatory disease)    Pyelonephritis    Trichomoniasis 05/13/2019   Urinary tract infection     Past Surgical History:  Procedure Laterality Date   ANKLE SURGERY     CESAREAN SECTION     x2   ENDOMETRIAL BIOPSY  02/23/2024   HYSTEROSCOPY WITH D & C N/A 06/13/2024   Procedure: DILATATION AND CURETTAGE /HYSTEROSCOPY;  Surgeon: Izell Harari, MD;  Location: [facility] OR;  Service: Gynecology;  Laterality: N/A;  hysteroscopy, d&c, possible myosure polypectomy   WISDOM TOOTH EXTRACTION      Family Psychiatric History:   Family History:  Family History  Problem Relation Age of Onset   Alcohol abuse Father    Heart disease Father    Lung cancer Father    Atrial fibrillation Father    Alcohol abuse Paternal Grandfather    Leukemia Paternal Grandmother    Anesthesia problems Neg Hx    Other Neg Hx     Social History:   Social History   Socioeconomic History   Marital status: Single    Spouse name: Not on file   Number of children: 2   Years of education: Not on file   Highest education level: High school graduate  Occupational History   Not on file  Tobacco Use   Smoking status: Former    Current packs/day: 0.50    Average packs/day: 0.5 packs/day for 17.0 years (8.5 ttl pk-yrs)    Types: Cigarettes    Passive exposure: Never   Smokeless tobacco: Never  Vaping Use   Vaping status: Never Used  Substance and Sexual Activity   Alcohol use: Not Currently    Comment: hx drinking up to one pint of Vodka daily.  will be 90 days sober 12-13-24    Drug use: Not Currently   Sexual activity: Yes    Birth control/protection: None  Other Topics Concern   Not on file  Social History Narrative   Not on file   Social Drivers of Health   Tobacco Use: Medium Risk (12/12/2024)   Patient History    Smoking Tobacco Use: Former    Smokeless Tobacco Use: Never    Passive Exposure: Never  Physicist, Medical Strain: Not on file  Food Insecurity: No Food Insecurity (07/07/2024)   Epic    Worried About Programme Researcher, Broadcasting/film/video in the Last Year: Never true    Ran Out of Food in the Last Year: Never true  Transportation Needs: Not on file  Physical Activity: Not on file  Stress: Not on file  Social Connections: Not on file  Depression (PHQ2-9): High Risk (12/11/2024)   Depression (PHQ2-9)    PHQ-2 Score: 18  Alcohol Screen: Low Risk (10/05/2024)   Alcohol Screen    Last Alcohol Screening Score (AUDIT): 3  Housing: Not on file  Utilities: Not on file  Health Literacy: Not on file  Additional Social History:   Allergies:  Allergies[1]  Metabolic Disorder Labs: Lab Results  Component Value Date   HGBA1C 5.6 02/23/2024   MPG 114.02 09/15/2017   No results found for: PROLACTIN Lab Results  Component Value Date   CHOL 179 10/25/2024   TRIG 118 10/25/2024   HDL 34 (L) 10/25/2024   CHOLHDL 5.3 (H) 10/25/2024   LDLCALC 123 (H) 10/25/2024   LDLCALC 128 (H) 07/26/2024   Lab Results  Component Value Date   TSH 2.350 02/23/2024    Therapeutic Level Labs: No results found for: LITHIUM No results found for: CBMZ No results found for: VALPROATE  Current Medications: Current Outpatient Medications  Medication Sig Dispense Refill   albuterol  (VENTOLIN  HFA) 108 (90 Base) MCG/ACT inhaler Inhale 2 puffs into the lungs every 4 (four) hours as needed for wheezing or shortness of breath. 18 g 0   amLODipine  (NORVASC ) 5 MG tablet Take 1 tablet (5 mg total) by mouth daily. 90 tablet 1   escitalopram  (LEXAPRO ) 10 MG tablet Take 1.5  tablets (15 mg total) by mouth daily. 135 tablet 1   fluconazole  (DIFLUCAN ) 150 MG tablet Take 1 tablet (150 mg total) by mouth every 3 (three) days. 2 tablet 0   gabapentin  (NEURONTIN ) 100 MG capsule Take 1 capsule (100 mg total) by mouth every 8 (eight) hours as needed. 30 capsule 2   Heating Pad PADS 1 Pad by Does not apply route continuous as needed.     lidocaine  (LIDODERM ) 5 % Place 1-2 patches onto the skin daily. Apply 1-2 patches to the area of pain for up to 12 hours at a time. Then, you must remove the patch for a full 12 hours before re-applying a new patch. 60 patch 0   megestrol  (MEGACE ) 40 MG tablet Take 1 tablet (40 mg total) by mouth 2 (two) times daily. You can stop the pill once the bleeding stops 60 tablet 1   Multiple Vitamin (MULTIVITAMIN WITH MINERALS) TABS tablet Take 1 tablet by mouth daily.     naltrexone  (DEPADE) 50 MG tablet Take 1 tablet (50 mg total) by mouth daily. 30 tablet 2   Omega-3 Fatty Acids (FISH OIL PO) Take 2 capsules by mouth daily.     semaglutide -weight management (WEGOVY ) 0.25 MG/0.5ML SOAJ SQ injection Inject 0.25 mg into the skin once a week. 2 mL 1   Vitamin D , Ergocalciferol , (DRISDOL ) 1.25 MG (50000 UNIT) CAPS capsule Take 1 capsule (50,000 Units total) by mouth every 7 (seven) days. 12 capsule 4   chlordiazePOXIDE  (LIBRIUM ) 25 MG capsule 50mg  PO TID x 1D, then 25-50mg  PO BID X 1D, then 25-50mg  PO QD X 1D 10 capsule 0   No current facility-administered medications for this visit.    Musculoskeletal: Virtual assessment  Psychiatric Specialty Exam: Review of Systems  Psychiatric/Behavioral:  Positive for decreased concentration and sleep disturbance. Negative for hallucinations and suicidal ideas. The patient is not hyperactive.   All other systems reviewed and are negative.   There were no vitals taken for this visit.There is no height or weight on file to calculate BMI.  General Appearance: Casual  Eye Contact:  Good  Speech:  Clear and  Coherent  Volume:  Normal  Mood:  Anxious and Depressed  Affect:  Congruent  Thought Process:  Coherent  Orientation:  Full (Time, Place, and Person)  Thought Content:  Logical  Suicidal Thoughts:  No  Homicidal Thoughts:  No  Memory:  Immediate;   Good Recent;  Good  Judgement:  Good  Insight:  Good  Psychomotor Activity:  Normal  Concentration:  Concentration: Good  Recall:  Good  Fund of Knowledge:Good  Language: Good  Akathisia:  No  Handed:  Right  AIMS (if indicated):  not done  Assets:  Communication Skills Desire for Improvement  ADL's:  Intact  Cognition: WNL  Sleep:  Fair   Screenings: GAD-7    Flowsheet Row Integrated Behavioral Health from 12/07/2024 in [facility] Integrated [facility]al Health from 11/23/2024 in [facility] Office Visit from 10/25/2024 in [facility] Health from 10/05/2024 in [facility] Office Visit from 09/13/2024 in [facility]  Total GAD-7 Score 19 19 15 14  0   PHQ2-9    Flowsheet Row Counselor from 12/11/2024 in [facility]al Health from 12/07/2024 in [facility] Health from 11/23/2024 in [facility] Office Visit from 10/25/2024 in [facility] from 10/05/2024 in [facility]  PHQ-2 Total Score 6 6 5 3 4   PHQ-9 Total Score 18 20 17 12 14    Flowsheet Row Counselor from 12/11/2024 in [facility]al Health from 12/07/2024 in [facility] from 11/23/2024 in [facility]  C-SSRS RISK CATEGORY Error:  Question 6 not populated No Risk No Risk    Assessment and Plan: Vesta Wheeland was enrolled in Intensive Outpatient  Program, patient's current medications are to be continued, a comprehensive treatment plan will be developed and side effects of medications have been reviewed with patient  Treatment options and alternatives reviewed with patient and patient understands the above plan. Treatment plan was reviewed and agreed upon by NP T.Ezzard and patient Ozetta Flatley need for group services.   Collaboration of Care: Medication Management AEB continue gabapentin , Lexapro , naltrexone  as directed  Patient/Guardian was advised Release of Information must be obtained prior to any record release in order to collaborate their care with an outside provider. Patient/Guardian was advised if they have not already done so to contact the registration department to sign all necessary forms in order for us  to release information regarding their care.   Consent: Patient/Guardian gives verbal consent for treatment and assignment of benefits for services provided during this visit. Patient/Guardian expressed understanding and agreed to proceed.   Staci LOISE Ezzard, NP 1/27/202612:51 PM     [1]  Allergies Allergen Reactions   Penicillins Nausea And Vomiting and Rash

## 2024-12-12 NOTE — Progress Notes (Signed)
 Virtual Visit via Video Note   I connected with Ramla Hase. Marzano on 12/12/24 at  9:00 AM EDT by a video enabled telemedicine application and verified that I am speaking with the correct person using two identifiers.   At orientation to the IOP program, Case Manager discussed the limitations of evaluation and management by telemedicine and the availability of in person appointments. The patient expressed understanding and agreed to proceed with virtual visits throughout the duration of the program.   Location:  Patient: Patient Home Provider: Home Office   History of Present Illness: MDD    Observations/Objective: Check In: Case Manager checked in with all participants to review discharge dates, insurance authorizations, work-related documents and needs from the treatment team regarding medications. Nakeia stated needs and engaged in discussion.    Initial Therapeutic Activity: Counselor facilitated a check-in with Denay to assess for safety, sobriety and medication compliance.  Counselor also inquired about Bellarose's current emotional ratings, as well as any significant changes in thoughts, feelings or behavior since previous check in.  Zophia presented for session on time and was alert, oriented x5, with no evidence or self-report of active SI/HI or A/V H.  Eleora reported compliance with medication and denied use of alcohol or illicit substances.  Madine reported scores of 9/10 for depression, 4/10 for anxiety, and 0/10 for anger/irritability.  Cheria denied any recent outbursts or panic attacks.  Mykah reported that a struggle has been struggling with symptoms of depression, anxiety, and trauma.  She reported that she was drinking alcohol to cope for several years, but has been making an effort to abstain for over 3 months now.  Dayja reported that a success was learning about group therapy and seeking admission into [facility] to improve stability.  Nautia reported that her goal today is to try to get out of the  house and run some errands.          Second Therapeutic Activity: Counselor engaged the group in discussion on managing work/life balance today to improve mental health and wellness.  Counselor explained how finding balance between responsibilities at home and work place can be challenging, and lead to increased stress.  Counselor facilitated discussion on what challenges members are currently, or have historically faced.  Counselor also discussed strategies for improving work/life balance while members work on their mental health during treatment.  Some of these included keeping track of time management; creating a list of priorities and scaling importance; setting realistic, measurable goals each day; establishing boundaries; taking care of health needs; and nurturing relationships at home and work for support.  Counselor inquired about areas where members feel they are excelling, as well as areas they could focus on during treatment. Intervention was effective, as evidenced by Dorthea actively participating in discussion on topic and reporting that she used to enjoy her job at [employer], but over time this has become more stressful due to long hours, and trying to balance caretaking duties for her father who is going through cancer treatment.  Molli reported that she experienced several symptoms of burnout, including withdrawing from responsibilities, isolating from others, lacking motivation, and having frequent headaches.  Geneva reported that there have also been numerous warning signs such as working long hours, working through lunch, and feeling resentful about the amount of time spent at work.  Abbagale was receptive to suggestions offered today for addressing work life imbalance, including monitoring her schedule more closely to ensure that she spends more time on self-care activities  like going to church, cooking nice meals, or exploring new hobbies.   She reported that she is also considering a new  career in something medical related, such as becoming a associate professor.    Assessment and Plan: Counselor recommends that Deseri remain in IOP treatment to better manage mental health symptoms, ensure stability and pursue completion of treatment plan goals. Counselor recommends adherence to crisis/safety plan, taking medications as prescribed, and following up with medical professionals if any issues arise.    Follow Up Instructions: Counselor will send [employer] Teams link for session tomorrow.  Elyssa was advised to call back or seek an in-person evaluation if the symptoms worsen or if the condition fails to improve as anticipated.   Collaboration of Care:   Medication Management AEB Staci Kerns, NP                                           Case Manager AEB Ricka Gaskins, CNA    Patient/Guardian was advised Release of Information must be obtained prior to any record release in order to collaborate their care with an outside provider. Patient/Guardian was advised if they have not already done so to contact the registration department to sign all necessary forms in order for us  to release information regarding their care.    Consent: Patient/Guardian gives verbal consent for treatment and assignment of benefits for services provided during this visit. Patient/Guardian expressed understanding and agreed to proceed.   I provided 180 minutes of non-face-to-face time during this encounter.   [name], LCSW, LCAS 12/12/24

## 2024-12-13 ENCOUNTER — Telehealth (HOSPITAL_COMMUNITY): Payer: Self-pay | Admitting: Psychiatry

## 2024-12-13 ENCOUNTER — Other Ambulatory Visit (HOSPITAL_COMMUNITY): Payer: Self-pay

## 2024-12-13 NOTE — Telephone Encounter (Signed)
 D:  Pt sent an email stating she had to assist with planning a funeral at the funeral home this morning at 10 a.m..  A:  Will excuse pt from virtual group today.  Inform the treatment team.

## 2024-12-14 ENCOUNTER — Ambulatory Visit: Payer: Self-pay | Admitting: Licensed Clinical Social Worker

## 2024-12-14 ENCOUNTER — Encounter: Payer: Self-pay | Admitting: Family Medicine

## 2024-12-14 ENCOUNTER — Other Ambulatory Visit (HOSPITAL_COMMUNITY): Payer: Self-pay | Attending: Psychiatry | Admitting: Psychiatry

## 2024-12-14 ENCOUNTER — Telehealth: Payer: Self-pay | Admitting: Licensed Clinical Social Worker

## 2024-12-14 DIAGNOSIS — F332 Major depressive disorder, recurrent severe without psychotic features: Secondary | ICD-10-CM | POA: Diagnosis present

## 2024-12-14 NOTE — Progress Notes (Signed)
 Virtual Visit via Video Note   I connected with Rebecca Flynn on 12/14/24 at  9:00 AM EDT by a video enabled telemedicine application and verified that I am speaking with the correct person using two identifiers.   At orientation to the IOP program, Case Manager discussed the limitations of evaluation and management by telemedicine and the availability of in person appointments. The patient expressed understanding and agreed to proceed with virtual visits throughout the duration of the program.   Location:  Patient: Patient Home Provider: [facility] Office   History of Present Illness: MDD    Observations/Objective: Check In: Case Manager checked in with all participants to review discharge dates, insurance authorizations, work-related documents and needs from the treatment team regarding medications. Rebecca Flynn stated needs and engaged in discussion.    Initial Therapeutic Activity: Counselor facilitated a check-in with Rebecca Flynn to assess for safety, sobriety and medication compliance.  Counselor also inquired about Rebecca Flynn's current emotional ratings, as well as any significant changes in thoughts, feelings or behavior since previous check in.  Rebecca Flynn presented for session on time and was alert, oriented x5, with no evidence or self-report of active SI/HI or A/V H.  Rebecca Flynn reported compliance with medication and denied use of alcohol or illicit substances.  Rebecca Flynn reported scores of 3/10 for depression, 2/10 for anxiety, and 7/10 for anger/irritability.  Rebecca Flynn denied any recent outbursts or panic attacks.  Rebecca Flynn reported that a struggle was missing group yesterday to assist a family member with funeral arrangements.  Rebecca Flynn reported that this gave her perspective, and she feels like it could prepare her for future challenges with loss.  Rebecca Flynn reported that her goal today is to get her hair done for self-care and look into starting a yoga routine.                 Second Therapeutic Activity: Counselor introduced  Rebecca Flynn, [facility] to provide psychoeducation on topic of Grief and Loss with members today.  Rebecca Flynn began discussion by checking in with the group about their baseline mood today, general thoughts on what grief means to them and how it has affected them personally in the past.  Rebecca Flynn provided information on how the process of grief/loss can differ depending upon one's unique culture, and categories of loss one could experience (i.e. loss of a person, animal, relationship, job, identity, etc).  Rebecca Flynn encouraged members to be mindful of how pervasive loss can be, and how to recognize signs which could indicate that this is having an impact on one's overall mental health and wellbeing.  Intervention was effective, as evidenced by Rebecca Flynn participating in discussion with speaker on the subject, reporting that she doesn't feel like the issue of grief or loss gets enough attention, and she has only recently started to reflect more on how much that this has impacted her.  She reported that for years she tried to run from problems related to this rather than face them.  She reported that she lost her brother this past year, and stated Eventually you have to deal with it if you want to heal.  Rebecca Flynn reported that she is trying to find things to look forward to in the future, such as visiting [place], and experiencing part of a culture she never truly understood growing up.    Assessment and Plan: Counselor recommends that Rebecca Flynn remain in IOP treatment to better manage mental health symptoms, ensure stability and pursue completion of treatment plan goals. Counselor recommends adherence to  crisis/safety plan, taking medications as prescribed, and following up with medical professionals if any issues arise.    Follow Up Instructions: Counselor will send Microsoft Teams link for session tomorrow.  Rebecca Flynn was advised to call back or seek an in-person evaluation if the symptoms worsen or if the condition fails  to improve as anticipated.   Collaboration of Care:   Medication Management AEB Rebecca Kerns, NP                                           Case Manager AEB Rebecca Gaskins, CNA    Patient/Guardian was advised Release of Information must be obtained prior to any record release in order to collaborate their care with an outside provider. Patient/Guardian was advised if they have not already done so to contact the registration department to sign all necessary forms in order for us  to release information regarding their care.    Consent: Patient/Guardian gives verbal consent for treatment and assignment of benefits for services provided during this visit. Patient/Guardian expressed understanding and agreed to proceed.   I provided 180 minutes of non-face-to-face time during this encounter.   Rebecca Ricker, LCSW, LCAS 12/14/24

## 2024-12-14 NOTE — Telephone Encounter (Signed)
 [facility] clinician attempted to reach patient again after she called practice back. Call was not answered, [facility] clinician left HIPAA compliant message.

## 2024-12-14 NOTE — Telephone Encounter (Signed)
 [facility] clinician attempted to reach pt via phone (pt requested IBH clinician reach out about FMLA paperwork).   No answer at time of call--phone message stated no voice mail was set up.  Attempted call x 2.    [facility] clinician spent 8 minutes attempting to contact patient.

## 2024-12-15 ENCOUNTER — Other Ambulatory Visit (HOSPITAL_COMMUNITY): Payer: Self-pay | Attending: Psychiatry | Admitting: Psychiatry

## 2024-12-15 DIAGNOSIS — F332 Major depressive disorder, recurrent severe without psychotic features: Secondary | ICD-10-CM | POA: Insufficient documentation

## 2024-12-15 NOTE — Telephone Encounter (Signed)
 Could not leave message--voice mail was not available

## 2024-12-15 NOTE — Progress Notes (Signed)
 Virtual Visit via Video Note   I connected with Toya Palacios. Borchers on 12/15/24 at  9:00 AM EDT by a video enabled telemedicine application and verified that I am speaking with the correct person using two identifiers.   At orientation to the IOP program, Case Manager discussed the limitations of evaluation and management by telemedicine and the availability of in person appointments. The patient expressed understanding and agreed to proceed with virtual visits throughout the duration of the program.   Location:  Patient: Patient Home Provider: Home Office   History of Present Illness: MDD    Observations/Objective: Check In: Case Manager checked in with all participants to review discharge dates, insurance authorizations, work-related documents and needs from the treatment team regarding medications. Elowen stated needs and engaged in discussion.    Initial Therapeutic Activity: Counselor facilitated a check-in with Kenitra to assess for safety, sobriety and medication compliance.  Counselor also inquired about Tashiba's current emotional ratings, as well as any significant changes in thoughts, feelings or behavior since previous check in.  Ami presented for session on time and was alert, oriented x5, with no evidence or self-report of active SI/HI or A/V H.  Dolorez reported compliance with medication and denied use of alcohol or illicit substances.  Natividad reported scores of 3/10 for depression, 3/10 for anxiety, and 0/10 for anger/irritability.  Jalyssa denied any recent outbursts or panic attacks.  Nohely denied any new struggles  Natalija reported that a success was getting her hair done and grabbing hibachi yesterday for self-care.  Falesha reported that her goal today is to eat lunch with friend and do some cleaning and organizing around the home.          Second Therapeutic Activity: Counselor introduced topic of self-care today.  Counselor explained how this can be defined as the things one does to maintain  good health and improve well-being.  Counselor provided members with a self-care assessment form to complete.  This handout featured various sub-categories of self-care, including physical, psychological/emotional, social, spiritual, and professional.  Members were asked to rank their engagement in the activities listed for each dimension on a scale of 1-3, with 1 indicating 'Poor', 2 indicating 'Ok', and 3 indicating 'Well'.  Counselor invited members to share results of their assessment, and inquired about which areas of self-care they are doing well in, as well as areas that require attention, and how they plan to begin addressing this during treatment.  Intervention was effective, as evidenced by Dorthea successfully completing initial 2 sections of assessment and actively engaging in discussion on subject, reporting that she is excelling in areas such as taking care of personal hygiene, going to preventative medical appointments, and finding reasons to laugh, but would benefit from focusing more on areas such as exercise, eating regularly, participating in fun activities, participating in hobbies, getting away from distractions, learning new things, and expressing feelings in a healthy way.  Janaysha reported that she would work to improve self-care deficits by getting back into a regular exercise regimen involving cardio, yoga, and aerobics; stop skipping meals; staying engaged in therapy to ensure an outlet for stress and maintain support;  exploring new hobbies that can help her learn new skills and get away from her phone such as candle making, joining a book club, or painting class.    Assessment and Plan: Counselor recommends that Francee remain in IOP treatment to better manage mental health symptoms, ensure stability and pursue completion of treatment plan goals. Counselor recommends adherence  to crisis/safety plan, taking medications as prescribed, and following up with medical professionals if any issues  arise.    Follow Up Instructions: Counselor will send [employer] Teams link for session tomorrow.  Jalilah was advised to call back or seek an in-person evaluation if the symptoms worsen or if the condition fails to improve as anticipated.   Collaboration of Care:   Medication Management AEB Staci Kerns, NP                                           Case Manager AEB Ricka Gaskins, CNA    Patient/Guardian was advised Release of Information must be obtained prior to any record release in order to collaborate their care with an outside provider. Patient/Guardian was advised if they have not already done so to contact the registration department to sign all necessary forms in order for us  to release information regarding their care.    Consent: Patient/Guardian gives verbal consent for treatment and assignment of benefits for services provided during this visit. Patient/Guardian expressed understanding and agreed to proceed.   I provided 180 minutes of non-face-to-face time during this encounter.   [name], LCSW, LCAS 12/15/24

## 2024-12-18 ENCOUNTER — Encounter: Payer: Self-pay | Admitting: Gastroenterology

## 2024-12-18 ENCOUNTER — Telehealth (HOSPITAL_COMMUNITY): Payer: Self-pay | Admitting: Psychiatry

## 2024-12-18 ENCOUNTER — Other Ambulatory Visit (HOSPITAL_COMMUNITY): Payer: Self-pay | Admitting: Psychiatry

## 2024-12-19 ENCOUNTER — Telehealth (HOSPITAL_COMMUNITY): Payer: Self-pay | Admitting: Psychiatry

## 2024-12-19 ENCOUNTER — Other Ambulatory Visit (HOSPITAL_COMMUNITY): Payer: Self-pay

## 2024-12-20 ENCOUNTER — Telehealth (HOSPITAL_COMMUNITY): Payer: Self-pay | Admitting: Psychiatry

## 2024-12-20 ENCOUNTER — Other Ambulatory Visit (HOSPITAL_COMMUNITY): Payer: Self-pay | Admitting: Psychiatry

## 2024-12-20 NOTE — Telephone Encounter (Signed)
 D:  Pt informed the MH-IOP Case Mgr that she is still out ill.  Plans to seek medical attention today.  A:  Inform the treatment team.

## 2024-12-21 ENCOUNTER — Telehealth: Payer: Self-pay | Admitting: Licensed Clinical Social Worker

## 2024-12-21 ENCOUNTER — Other Ambulatory Visit (HOSPITAL_COMMUNITY): Payer: Self-pay | Admitting: Psychiatry

## 2024-12-21 ENCOUNTER — Encounter: Payer: Self-pay | Admitting: Family Medicine

## 2024-12-21 ENCOUNTER — Ambulatory Visit: Payer: Self-pay | Admitting: Licensed Clinical Social Worker

## 2024-12-21 DIAGNOSIS — F332 Major depressive disorder, recurrent severe without psychotic features: Secondary | ICD-10-CM

## 2024-12-21 NOTE — Progress Notes (Signed)
 Virtual Visit via Video Note   I connected with Rebecca Flynn. Wahlen on 12/21/24 at  9:00 AM EDT by a video enabled telemedicine application and verified that I am speaking with the correct person using two identifiers.   At orientation to the IOP program, Case Manager discussed the limitations of evaluation and management by telemedicine and the availability of in person appointments. The patient expressed understanding and agreed to proceed with virtual visits throughout the duration of the program.   Location:  Patient: Patient Home Provider: [facility]   History of Present Illness: MDD    Observations/Objective: Check In: Case Manager checked in with all participants to review discharge dates, insurance authorizations, work-related documents and needs from the treatment team regarding medications. Rebecca Flynn stated needs and engaged in discussion.    Initial Therapeutic Activity: Counselor facilitated a check-in with Rebecca Flynn to assess for safety, sobriety and medication compliance.  Counselor also inquired about Rebecca Flynn's current emotional ratings, as well as any significant changes in thoughts, feelings or behavior since previous check in.  Rema presented for session on time and was alert, oriented x5, with no evidence or self-report of active SI/HI or A/V H.  Rebecca Flynn reported compliance with medication and denied use of alcohol or illicit substances.  Rebecca Flynn reported scores of 1/10 for depression, 3/10 for anxiety, and 1/10 for anger/irritability.  Rebecca Flynn denied any recent outbursts or panic attacks.  Rebecca Flynn reported that a struggle has been dealing with sickness, which left her without a voice, and required her to miss group temporarily for a few days.  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  Rebecca Flynn reported that her goal today is to attend a bible study session in the evening.          Second Therapeutic Activity: Counselor introduced topic of stress management today.   Counselor provided definition of stress as feeling tense, overwhelmed, worn out, and/or exhausted, and noted that in small amounts, stress can be motivating until things become too overwhelming to manage.  Counselor also explained how stress can be acute (brief but intense) or chronic (long-lasting) and this can impact the severity of symptoms one can experience in the physical, emotional, and behavioral categories.  Counselor inquired about members' specific stressors, how long they have been prevalent, and the various symptoms that tend to manifest as a result.  Counselor also offered several stress management strategies to help improve members' coping ability, including journaling, gratitude practice, relaxation techniques, and time management tips.  Counselor also explained that research has shown a strong support network composed of trusted family, friends, or community members can increase resilience in times of stress, and inquired about who members can reach out to for help in managing stressors.  Counselor encouraged members to consider discussing stressor 'red flags' with their close supports that can be monitored and strategies for assisting them in times of crisis.  Intervention was effective, as evidenced by Rebecca actively participating in discussion on subject, reporting that her most significant stressors include money worries, family conflict, and pain/fatigue.  Rebecca Flynn was able to identify several warning signs related to stress, including high blood pressure, lack of motivation, blaming other people for her problems, and aggression.  Rebecca Flynn reported that her stress management goal is to use skills learned from group to stay more grounded throughout the day.  Rebecca Flynn also expressed receptiveness to several stress management strategies practiced today in session, including deep breathing, and focusing more on stressors that are  within her control.    Assessment and Plan: Counselor recommends that Rebecca Flynn  remain in IOP treatment to better manage mental health symptoms, ensure stability and pursue completion of treatment plan goals. Counselor recommends adherence to crisis/safety plan, taking medications as prescribed, and following up with medical professionals if any issues arise.    Follow Up Instructions: Counselor will send [employer] Teams link for session tomorrow.  Rebecca Flynn was advised to call back or seek an in-person evaluation if the symptoms worsen or if the condition fails to improve as anticipated.   Collaboration of Care:   Medication Management AEB Staci Kerns, NP                                           Case Manager AEB Ricka Gaskins, CNA    Patient/Guardian was advised Release of Information must be obtained prior to any record release in order to collaborate their care with an outside provider. Patient/Guardian was advised if they have not already done so to contact the registration department to sign all necessary forms in order for us  to release information regarding their care.    Consent: Patient/Guardian gives verbal consent for treatment and assignment of benefits for services provided during this visit. Patient/Guardian expressed understanding and agreed to proceed.   I provided 180 minutes of non-face-to-face time during this encounter.   [name], LCSW, LCAS 12/21/24

## 2024-12-21 NOTE — Telephone Encounter (Signed)
 Pt requested edit of FMLA paperwork. IBH clinician informed pt that submitted paperwork cannot be edited--pt needs to send new blank paperwork to [facility] for clinician to edit.  Instructed pt to fax new paperwork to [facility] and staff will inform [facility] clinician when paperwork is received and it will be completed.

## 2024-12-22 ENCOUNTER — Other Ambulatory Visit (HOSPITAL_COMMUNITY): Payer: Self-pay | Admitting: Licensed Clinical Social Worker

## 2024-12-22 DIAGNOSIS — F332 Major depressive disorder, recurrent severe without psychotic features: Secondary | ICD-10-CM

## 2024-12-22 NOTE — Progress Notes (Signed)
 Virtual Visit via Video Note   I connected with Rebecca Flynn. Rebecca Flynn on 12/22/24 at  9:00 AM EDT by a video enabled telemedicine application and verified that I am speaking with the correct person using two identifiers.   At orientation to the IOP program, Case Manager discussed the limitations of evaluation and management by telemedicine and the availability of in person appointments. The patient expressed understanding and agreed to proceed with virtual visits throughout the duration of the program.   Location:  Patient: Patient Home Provider: Home Office   History of Present Illness: MDD     Observations/Objective: Check In: Case Manager checked in with all participants to review discharge dates, insurance authorizations, work-related documents and needs from the treatment team regarding medications. Rebecca Flynn stated needs and engaged in discussion.    Initial Therapeutic Activity: Counselor facilitated a check-in with Rebecca Flynn to assess for safety, sobriety and medication compliance.  Counselor also inquired about Rebecca Flynn's current emotional ratings, as well as any significant changes in thoughts, feelings or behavior since previous check in.  Rebecca Flynn presented for session on time and was alert, oriented x5, with no evidence or self-report of active SI/HI or A/V H.  Rebecca Flynn reported compliance with medication and denied use of alcohol or illicit substances.  Rebecca Flynn reported scores of 4/10 for depression, 0/10 for anxiety, and 0/10 for anger/irritability.  Rebecca Flynn denied any recent outbursts or panic attacks.  Rebecca Flynn denied experiencing any struggles yesterday.  Rebecca Flynn reported that a success was working on a Sunday school lesson to distract herself and then made a nice meal for dinner.  Rebecca Flynn reported that her goal today is to attend a wake for a family friend and then prepare for the funeral this weekend.      Second Therapeutic Activity: Counselor proposed practice of guided imagery exercise with members today to  improve their relaxation and stress management skills.  This visualization featured a casual walk through a nature trail in a forest on a pleasant fall day.  Counselor invited members to get comfortable, achieve a relaxing breathing pattern, and then began narration of this visualization, including various sensory details (i.e. feeling of the sun on the skin, smell of pine trees, sound of running stream and breeze through leaves) to enhance experience over course of activity.  Counselor processed experience afterward with each member, inquiring about effectiveness of activity, any issues that may have arisen, and whether they intend to add this to self-care routine.  Intervention effectiveness was mixed, as evidenced by Rebecca Flynn participating in activity successfully and reporting that although she tried to focus, she found it hard to concentrate on instructions and felt more tired than anything else.  She stated Maybe I don't know what peace is.  She reported that she would maintain an open mind in the future and try other exercises offered.    Third Therapeutic Activity: Psycho-educational portion of group was provided by Lorane Rochester, interior and spatial designer of community education with [employer].  Alexandra provided information on history of her local agency, mission statement, and the variety of unique services offered which group members might find beneficial to engage in, including both virtual and in-person support groups, as well as peer support program for mentoring.  Alexandra offered time to answer member's questions regarding services and encouraged them to consider utilizing these services to assist in working towards their individual wellness goals.  Intervention was effective, as evidenced by Rebecca Flynn participating in discussion with speaker on the subject, reporting that she would be interested in  joining the women's support group in order to build more support.  She reported that she will also encourage  people in her family to consider looking into some of these resources since they could benefit as well.    Assessment and Plan: Counselor recommends that Rebecca Flynn remain in IOP treatment to better manage mental health symptoms, ensure stability and pursue completion of treatment plan goals. Counselor recommends adherence to crisis/safety plan, taking medications as prescribed, and following up with medical professionals if any issues arise.    Follow Up Instructions: Counselor will send [employer] Teams link for session tomorrow.  Rebecca Flynn was advised to call back or seek an in-person evaluation if the symptoms worsen or if the condition fails to improve as anticipated.   Collaboration of Care:   Medication Management AEB Staci Kerns, NP                                           Case Manager AEB Ricka Gaskins, CNA    Patient/Guardian was advised Release of Information must be obtained prior to any record release in order to collaborate their care with an outside provider. Patient/Guardian was advised if they have not already done so to contact the registration department to sign all necessary forms in order for us  to release information regarding their care.    Consent: Patient/Guardian gives verbal consent for treatment and assignment of benefits for services provided during this visit. Patient/Guardian expressed understanding and agreed to proceed.   I provided 180 minutes of non-face-to-face time during this encounter.   [name], LCSW, LCAS 12/22/24

## 2024-12-25 ENCOUNTER — Other Ambulatory Visit (HOSPITAL_COMMUNITY): Payer: Self-pay

## 2024-12-26 ENCOUNTER — Other Ambulatory Visit (HOSPITAL_COMMUNITY): Payer: Self-pay

## 2024-12-27 ENCOUNTER — Other Ambulatory Visit (HOSPITAL_COMMUNITY): Payer: Self-pay

## 2024-12-28 ENCOUNTER — Ambulatory Visit: Payer: Self-pay | Admitting: Family Medicine

## 2024-12-28 ENCOUNTER — Other Ambulatory Visit (HOSPITAL_COMMUNITY): Payer: Self-pay

## 2024-12-29 ENCOUNTER — Other Ambulatory Visit (HOSPITAL_COMMUNITY): Payer: Self-pay

## 2025-01-01 ENCOUNTER — Other Ambulatory Visit (HOSPITAL_COMMUNITY): Payer: Self-pay

## 2025-01-02 ENCOUNTER — Other Ambulatory Visit (HOSPITAL_COMMUNITY): Payer: Self-pay

## 2025-01-03 ENCOUNTER — Other Ambulatory Visit (HOSPITAL_COMMUNITY): Payer: Self-pay

## 2025-01-04 ENCOUNTER — Other Ambulatory Visit (HOSPITAL_COMMUNITY): Payer: Self-pay

## 2025-01-05 ENCOUNTER — Other Ambulatory Visit (HOSPITAL_COMMUNITY): Payer: Self-pay

## 2025-01-08 ENCOUNTER — Other Ambulatory Visit (HOSPITAL_COMMUNITY): Payer: Self-pay

## 2025-01-09 ENCOUNTER — Other Ambulatory Visit (HOSPITAL_COMMUNITY): Payer: Self-pay

## 2025-01-10 ENCOUNTER — Other Ambulatory Visit (HOSPITAL_COMMUNITY): Payer: Self-pay

## 2025-01-11 ENCOUNTER — Other Ambulatory Visit (HOSPITAL_COMMUNITY): Payer: Self-pay

## 2025-01-12 ENCOUNTER — Other Ambulatory Visit (HOSPITAL_COMMUNITY): Payer: Self-pay

## 2025-10-31 ENCOUNTER — Encounter: Admitting: Family Medicine
